# Patient Record
Sex: Female | Born: 1955 | Race: White | Hispanic: No | Marital: Married | Smoking: Never smoker
Health system: Southern US, Community
[De-identification: ages and names within clinical notes are randomized; demographics above are authoritative.]

## PROBLEM LIST (undated history)

## (undated) DIAGNOSIS — M25559 Pain in unspecified hip: Secondary | ICD-10-CM

## (undated) DIAGNOSIS — T7840XA Allergy, unspecified, initial encounter: Secondary | ICD-10-CM

## (undated) DIAGNOSIS — J45909 Unspecified asthma, uncomplicated: Secondary | ICD-10-CM

## (undated) DIAGNOSIS — M1990 Unspecified osteoarthritis, unspecified site: Secondary | ICD-10-CM

## (undated) DIAGNOSIS — R102 Pelvic and perineal pain: Secondary | ICD-10-CM

## (undated) DIAGNOSIS — G709 Myoneural disorder, unspecified: Secondary | ICD-10-CM

## (undated) DIAGNOSIS — I10 Essential (primary) hypertension: Secondary | ICD-10-CM

## (undated) DIAGNOSIS — F439 Reaction to severe stress, unspecified: Secondary | ICD-10-CM

## (undated) DIAGNOSIS — G43909 Migraine, unspecified, not intractable, without status migrainosus: Secondary | ICD-10-CM

## (undated) DIAGNOSIS — J9801 Acute bronchospasm: Secondary | ICD-10-CM

## (undated) DIAGNOSIS — K638219 Small intestinal bacterial overgrowth, unspecified: Secondary | ICD-10-CM

## (undated) DIAGNOSIS — K219 Gastro-esophageal reflux disease without esophagitis: Secondary | ICD-10-CM

## (undated) DIAGNOSIS — L270 Generalized skin eruption due to drugs and medicaments taken internally: Secondary | ICD-10-CM

## (undated) DIAGNOSIS — K449 Diaphragmatic hernia without obstruction or gangrene: Secondary | ICD-10-CM

## (undated) DIAGNOSIS — D414 Neoplasm of uncertain behavior of bladder: Secondary | ICD-10-CM

## (undated) DIAGNOSIS — G8929 Other chronic pain: Secondary | ICD-10-CM

## (undated) DIAGNOSIS — F419 Anxiety disorder, unspecified: Secondary | ICD-10-CM

## (undated) DIAGNOSIS — K6389 Other specified diseases of intestine: Secondary | ICD-10-CM

## (undated) DIAGNOSIS — T148XXA Other injury of unspecified body region, initial encounter: Secondary | ICD-10-CM

## (undated) DIAGNOSIS — Z77120 Contact with and (suspected) exposure to mold (toxic): Secondary | ICD-10-CM

## (undated) DIAGNOSIS — R51 Headache: Secondary | ICD-10-CM

## (undated) HISTORY — DX: Myoneural disorder, unspecified: G70.9

## (undated) HISTORY — DX: Contact with and (suspected) exposure to mold (toxic): Z77.120

## (undated) HISTORY — DX: Other injury of unspecified body region, initial encounter: T14.8XXA

## (undated) HISTORY — DX: Migraine, unspecified, not intractable, without status migrainosus: G43.909

## (undated) HISTORY — DX: Unspecified asthma, uncomplicated: J45.909

## (undated) HISTORY — DX: Other specified diseases of intestine: K63.89

## (undated) HISTORY — PX: OTHER SURGICAL HISTORY: SHX169

## (undated) HISTORY — DX: Reaction to severe stress, unspecified: F43.9

## (undated) HISTORY — DX: Gastro-esophageal reflux disease without esophagitis: K21.9

## (undated) HISTORY — DX: Essential (primary) hypertension: I10

## (undated) HISTORY — DX: Generalized skin eruption due to drugs and medicaments taken internally: L27.0

## (undated) HISTORY — PX: ABDOMINAL HYSTERECTOMY: SHX81

## (undated) HISTORY — DX: Unspecified osteoarthritis, unspecified site: M19.90

## (undated) HISTORY — DX: Anxiety disorder, unspecified: F41.9

## (undated) HISTORY — PX: WISDOM TOOTH EXTRACTION: SHX21

## (undated) HISTORY — DX: Headache: R51

## (undated) HISTORY — DX: Neoplasm of uncertain behavior of bladder: D41.4

## (undated) HISTORY — DX: Allergy, unspecified, initial encounter: T78.40XA

## (undated) HISTORY — DX: Pelvic and perineal pain: R10.2

## (undated) HISTORY — DX: Diaphragmatic hernia without obstruction or gangrene: K44.9

## (undated) HISTORY — DX: Pain in unspecified hip: M25.559

## (undated) HISTORY — DX: Other chronic pain: G89.29

## (undated) HISTORY — PX: COLONOSCOPY: SHX174

## (undated) HISTORY — DX: Small intestinal bacterial overgrowth, unspecified: K63.8219

## (undated) HISTORY — DX: Acute bronchospasm: J98.01

---

## 1985-05-12 HISTORY — PX: OTHER SURGICAL HISTORY: SHX169

## 1998-02-07 ENCOUNTER — Other Ambulatory Visit: Admission: RE | Admit: 1998-02-07 | Discharge: 1998-02-07 | Payer: Self-pay | Admitting: Obstetrics & Gynecology

## 1999-06-11 ENCOUNTER — Other Ambulatory Visit: Admission: RE | Admit: 1999-06-11 | Discharge: 1999-06-11 | Payer: Self-pay | Admitting: Obstetrics & Gynecology

## 1999-09-19 ENCOUNTER — Encounter: Payer: Self-pay | Admitting: Internal Medicine

## 1999-09-19 ENCOUNTER — Ambulatory Visit (HOSPITAL_COMMUNITY): Admission: RE | Admit: 1999-09-19 | Discharge: 1999-09-19 | Payer: Self-pay | Admitting: Internal Medicine

## 2001-04-28 ENCOUNTER — Other Ambulatory Visit: Admission: RE | Admit: 2001-04-28 | Discharge: 2001-04-28 | Payer: Self-pay | Admitting: Obstetrics & Gynecology

## 2004-03-26 ENCOUNTER — Other Ambulatory Visit: Admission: RE | Admit: 2004-03-26 | Discharge: 2004-03-26 | Payer: Self-pay | Admitting: Obstetrics & Gynecology

## 2004-05-20 ENCOUNTER — Ambulatory Visit: Payer: Self-pay | Admitting: Internal Medicine

## 2004-07-22 ENCOUNTER — Ambulatory Visit: Payer: Self-pay | Admitting: Family Medicine

## 2005-01-14 ENCOUNTER — Ambulatory Visit: Payer: Self-pay | Admitting: Internal Medicine

## 2005-04-30 ENCOUNTER — Ambulatory Visit: Payer: Self-pay | Admitting: Internal Medicine

## 2005-06-13 ENCOUNTER — Ambulatory Visit: Payer: Self-pay | Admitting: Internal Medicine

## 2005-06-16 ENCOUNTER — Encounter: Admission: RE | Admit: 2005-06-16 | Discharge: 2005-06-16 | Payer: Self-pay | Admitting: Internal Medicine

## 2005-06-18 ENCOUNTER — Ambulatory Visit: Payer: Self-pay | Admitting: Internal Medicine

## 2005-06-19 ENCOUNTER — Ambulatory Visit: Payer: Self-pay | Admitting: *Deleted

## 2005-07-04 ENCOUNTER — Ambulatory Visit: Payer: Self-pay | Admitting: Internal Medicine

## 2005-08-26 ENCOUNTER — Ambulatory Visit: Payer: Self-pay | Admitting: Internal Medicine

## 2005-09-02 ENCOUNTER — Ambulatory Visit: Payer: Self-pay | Admitting: Internal Medicine

## 2005-12-10 ENCOUNTER — Ambulatory Visit: Payer: Self-pay | Admitting: Internal Medicine

## 2006-01-13 ENCOUNTER — Ambulatory Visit: Payer: Self-pay | Admitting: Internal Medicine

## 2006-04-27 ENCOUNTER — Ambulatory Visit: Payer: Self-pay | Admitting: Internal Medicine

## 2006-06-15 ENCOUNTER — Inpatient Hospital Stay (HOSPITAL_COMMUNITY): Admission: RE | Admit: 2006-06-15 | Discharge: 2006-06-17 | Payer: Self-pay | Admitting: Obstetrics and Gynecology

## 2006-06-15 ENCOUNTER — Encounter (INDEPENDENT_AMBULATORY_CARE_PROVIDER_SITE_OTHER): Payer: Self-pay | Admitting: *Deleted

## 2006-09-03 ENCOUNTER — Encounter: Payer: Self-pay | Admitting: Internal Medicine

## 2006-09-21 ENCOUNTER — Ambulatory Visit: Payer: Self-pay | Admitting: Internal Medicine

## 2006-10-22 ENCOUNTER — Ambulatory Visit: Payer: Self-pay | Admitting: Internal Medicine

## 2006-11-25 ENCOUNTER — Ambulatory Visit: Payer: Self-pay | Admitting: Internal Medicine

## 2006-12-10 ENCOUNTER — Encounter: Payer: Self-pay | Admitting: *Deleted

## 2006-12-10 DIAGNOSIS — R1084 Generalized abdominal pain: Secondary | ICD-10-CM | POA: Insufficient documentation

## 2006-12-10 DIAGNOSIS — R109 Unspecified abdominal pain: Secondary | ICD-10-CM

## 2007-01-11 ENCOUNTER — Telehealth: Payer: Self-pay | Admitting: Internal Medicine

## 2007-01-13 DIAGNOSIS — R519 Headache, unspecified: Secondary | ICD-10-CM | POA: Insufficient documentation

## 2007-01-13 DIAGNOSIS — R51 Headache: Secondary | ICD-10-CM

## 2007-04-06 ENCOUNTER — Telehealth: Payer: Self-pay | Admitting: Internal Medicine

## 2007-04-26 ENCOUNTER — Ambulatory Visit: Payer: Self-pay | Admitting: Internal Medicine

## 2007-04-26 DIAGNOSIS — M76899 Other specified enthesopathies of unspecified lower limb, excluding foot: Secondary | ICD-10-CM | POA: Insufficient documentation

## 2007-04-26 DIAGNOSIS — N951 Menopausal and female climacteric states: Secondary | ICD-10-CM

## 2007-05-04 ENCOUNTER — Encounter: Payer: Self-pay | Admitting: Internal Medicine

## 2007-06-14 ENCOUNTER — Telehealth: Payer: Self-pay | Admitting: Internal Medicine

## 2007-06-29 ENCOUNTER — Ambulatory Visit: Payer: Self-pay | Admitting: Internal Medicine

## 2007-06-29 DIAGNOSIS — G4700 Insomnia, unspecified: Secondary | ICD-10-CM

## 2007-07-01 ENCOUNTER — Encounter: Payer: Self-pay | Admitting: Internal Medicine

## 2007-09-06 ENCOUNTER — Telehealth: Payer: Self-pay | Admitting: Internal Medicine

## 2007-09-10 ENCOUNTER — Ambulatory Visit: Payer: Self-pay | Admitting: Internal Medicine

## 2007-09-10 DIAGNOSIS — J309 Allergic rhinitis, unspecified: Secondary | ICD-10-CM | POA: Insufficient documentation

## 2007-09-10 DIAGNOSIS — N760 Acute vaginitis: Secondary | ICD-10-CM | POA: Insufficient documentation

## 2007-09-10 DIAGNOSIS — J019 Acute sinusitis, unspecified: Secondary | ICD-10-CM

## 2007-10-12 ENCOUNTER — Telehealth: Payer: Self-pay | Admitting: Internal Medicine

## 2007-11-11 ENCOUNTER — Ambulatory Visit: Payer: Self-pay | Admitting: Internal Medicine

## 2007-11-11 DIAGNOSIS — H00029 Hordeolum internum unspecified eye, unspecified eyelid: Secondary | ICD-10-CM | POA: Insufficient documentation

## 2007-12-10 ENCOUNTER — Telehealth: Payer: Self-pay | Admitting: Internal Medicine

## 2008-01-14 ENCOUNTER — Telehealth: Payer: Self-pay | Admitting: Internal Medicine

## 2008-03-08 ENCOUNTER — Telehealth: Payer: Self-pay | Admitting: Internal Medicine

## 2008-04-27 ENCOUNTER — Telehealth: Payer: Self-pay | Admitting: Internal Medicine

## 2008-05-16 ENCOUNTER — Ambulatory Visit: Payer: Self-pay | Admitting: Internal Medicine

## 2008-05-23 ENCOUNTER — Telehealth: Payer: Self-pay | Admitting: Internal Medicine

## 2008-06-13 ENCOUNTER — Ambulatory Visit: Payer: Self-pay | Admitting: Internal Medicine

## 2008-06-13 DIAGNOSIS — M79609 Pain in unspecified limb: Secondary | ICD-10-CM | POA: Insufficient documentation

## 2008-06-13 DIAGNOSIS — R21 Rash and other nonspecific skin eruption: Secondary | ICD-10-CM | POA: Insufficient documentation

## 2008-06-23 ENCOUNTER — Telehealth: Payer: Self-pay | Admitting: Internal Medicine

## 2008-06-26 ENCOUNTER — Telehealth: Payer: Self-pay | Admitting: Internal Medicine

## 2008-06-30 ENCOUNTER — Ambulatory Visit: Payer: Self-pay | Admitting: Internal Medicine

## 2008-07-10 ENCOUNTER — Telehealth: Payer: Self-pay | Admitting: Internal Medicine

## 2008-07-13 ENCOUNTER — Telehealth: Payer: Self-pay | Admitting: Internal Medicine

## 2008-07-19 ENCOUNTER — Encounter: Payer: Self-pay | Admitting: Internal Medicine

## 2008-08-28 ENCOUNTER — Ambulatory Visit: Payer: Self-pay | Admitting: Internal Medicine

## 2008-08-28 LAB — CONVERTED CEMR LAB
ALT: 13 units/L (ref 0–35)
AST: 17 units/L (ref 0–37)
Albumin: 3.6 g/dL (ref 3.5–5.2)
Alkaline Phosphatase: 39 units/L (ref 39–117)
BUN: 12 mg/dL (ref 6–23)
Basophils Absolute: 0 10*3/uL (ref 0.0–0.1)
Basophils Relative: 0.3 % (ref 0.0–3.0)
Bilirubin Urine: NEGATIVE
Bilirubin, Direct: 0.1 mg/dL (ref 0.0–0.3)
CO2: 28 meq/L (ref 19–32)
Calcium: 8.6 mg/dL (ref 8.4–10.5)
Chloride: 112 meq/L (ref 96–112)
Cholesterol: 136 mg/dL (ref 0–200)
Creatinine, Ser: 0.7 mg/dL (ref 0.4–1.2)
Eosinophils Absolute: 0.1 10*3/uL (ref 0.0–0.7)
Eosinophils Relative: 3.1 % (ref 0.0–5.0)
GFR calc non Af Amer: 93.58 mL/min (ref >60)
Glucose, Bld: 92 mg/dL (ref 70–99)
Glucose, Urine, Semiquant: NEGATIVE
HCT: 36.4 % (ref 36.0–46.0)
HDL: 44.7 mg/dL (ref >39.00)
Hemoglobin: 12.7 g/dL (ref 12.0–15.0)
Ketones, urine, test strip: NEGATIVE
LDL Cholesterol: 85 mg/dL (ref 0–99)
Lymphocytes Relative: 34.7 % (ref 12.0–46.0)
Lymphs Abs: 1.4 10*3/uL (ref 0.7–4.0)
MCHC: 35 g/dL (ref 30.0–36.0)
MCV: 88.1 fL (ref 78.0–100.0)
Monocytes Absolute: 0.3 10*3/uL (ref 0.1–1.0)
Monocytes Relative: 6.8 % (ref 3.0–12.0)
Neutro Abs: 2.1 10*3/uL (ref 1.4–7.7)
Neutrophils Relative %: 55.1 % (ref 43.0–77.0)
Nitrite: NEGATIVE
Platelets: 195 10*3/uL (ref 150.0–400.0)
Potassium: 3.9 meq/L (ref 3.5–5.1)
RBC: 4.13 M/uL (ref 3.87–5.11)
RDW: 12.5 % (ref 11.5–14.6)
Sodium: 144 meq/L (ref 135–145)
Specific Gravity, Urine: >=1.03
TSH: 1.85 microintl units/mL (ref 0.35–5.50)
Total Bilirubin: 0.6 mg/dL (ref 0.3–1.2)
Total CHOL/HDL Ratio: 3
Total Protein: 5.9 g/dL — ABNORMAL LOW (ref 6.0–8.3)
Triglycerides: 31 mg/dL (ref 0.0–149.0)
Urobilinogen, UA: 0.2
VLDL: 6.2 mg/dL (ref 0.0–40.0)
WBC Urine, dipstick: NEGATIVE
WBC: 3.9 10*3/uL — ABNORMAL LOW (ref 4.5–10.5)
pH: 5.5

## 2008-09-19 ENCOUNTER — Ambulatory Visit: Payer: Self-pay | Admitting: Internal Medicine

## 2008-10-03 ENCOUNTER — Telehealth: Payer: Self-pay | Admitting: *Deleted

## 2008-10-11 ENCOUNTER — Encounter: Payer: Self-pay | Admitting: Internal Medicine

## 2008-10-30 ENCOUNTER — Telehealth: Payer: Self-pay | Admitting: Internal Medicine

## 2008-12-25 ENCOUNTER — Telehealth: Payer: Self-pay | Admitting: *Deleted

## 2008-12-25 ENCOUNTER — Ambulatory Visit: Payer: Self-pay | Admitting: Family Medicine

## 2009-02-06 ENCOUNTER — Telehealth: Payer: Self-pay | Admitting: *Deleted

## 2009-03-05 ENCOUNTER — Telehealth: Payer: Self-pay | Admitting: *Deleted

## 2009-03-12 ENCOUNTER — Ambulatory Visit: Payer: Self-pay | Admitting: Internal Medicine

## 2009-03-12 DIAGNOSIS — J029 Acute pharyngitis, unspecified: Secondary | ICD-10-CM

## 2009-03-20 ENCOUNTER — Ambulatory Visit: Payer: Self-pay | Admitting: Internal Medicine

## 2009-03-26 ENCOUNTER — Telehealth: Payer: Self-pay | Admitting: *Deleted

## 2009-03-29 ENCOUNTER — Ambulatory Visit: Payer: Self-pay | Admitting: Internal Medicine

## 2009-03-29 DIAGNOSIS — K5289 Other specified noninfective gastroenteritis and colitis: Secondary | ICD-10-CM | POA: Insufficient documentation

## 2009-03-29 DIAGNOSIS — G43909 Migraine, unspecified, not intractable, without status migrainosus: Secondary | ICD-10-CM

## 2009-03-29 HISTORY — DX: Migraine, unspecified, not intractable, without status migrainosus: G43.909

## 2009-07-23 ENCOUNTER — Telehealth: Payer: Self-pay | Admitting: *Deleted

## 2009-08-30 ENCOUNTER — Ambulatory Visit: Payer: Self-pay | Admitting: Internal Medicine

## 2009-08-30 LAB — CONVERTED CEMR LAB
ALT: 13 units/L (ref 0–35)
AST: 18 units/L (ref 0–37)
Albumin: 4.1 g/dL (ref 3.5–5.2)
Alkaline Phosphatase: 33 units/L — ABNORMAL LOW (ref 39–117)
BUN: 22 mg/dL (ref 6–23)
Basophils Absolute: 0 10*3/uL (ref 0.0–0.1)
Basophils Relative: 0.4 % (ref 0.0–3.0)
Bilirubin Urine: NEGATIVE
Bilirubin, Direct: 0.1 mg/dL (ref 0.0–0.3)
Blood in Urine, dipstick: NEGATIVE
CO2: 27 meq/L (ref 19–32)
Calcium: 9 mg/dL (ref 8.4–10.5)
Chloride: 107 meq/L (ref 96–112)
Cholesterol: 137 mg/dL (ref 0–200)
Creatinine, Ser: 0.7 mg/dL (ref 0.4–1.2)
Eosinophils Absolute: 0.1 10*3/uL (ref 0.0–0.7)
Eosinophils Relative: 2.5 % (ref 0.0–5.0)
GFR calc non Af Amer: 93.21 mL/min (ref >60)
Glucose, Bld: 77 mg/dL (ref 70–99)
Glucose, Urine, Semiquant: NEGATIVE
HCT: 36.9 % (ref 36.0–46.0)
HDL: 49.6 mg/dL (ref >39.00)
Hemoglobin: 12.7 g/dL (ref 12.0–15.0)
Ketones, urine, test strip: NEGATIVE
LDL Cholesterol: 81 mg/dL (ref 0–99)
Lymphocytes Relative: 36.5 % (ref 12.0–46.0)
Lymphs Abs: 1.5 10*3/uL (ref 0.7–4.0)
MCHC: 34.4 g/dL (ref 30.0–36.0)
MCV: 87.5 fL (ref 78.0–100.0)
Monocytes Absolute: 0.2 10*3/uL (ref 0.1–1.0)
Monocytes Relative: 6.1 % (ref 3.0–12.0)
Neutro Abs: 2.2 10*3/uL (ref 1.4–7.7)
Neutrophils Relative %: 54.5 % (ref 43.0–77.0)
Nitrite: NEGATIVE
Platelets: 211 10*3/uL (ref 150.0–400.0)
Potassium: 4.2 meq/L (ref 3.5–5.1)
Protein, U semiquant: NEGATIVE
RBC: 4.21 M/uL (ref 3.87–5.11)
RDW: 13.1 % (ref 11.5–14.6)
Sodium: 139 meq/L (ref 135–145)
Specific Gravity, Urine: 1.015
TSH: 1.55 microintl units/mL (ref 0.35–5.50)
Total Bilirubin: 0.7 mg/dL (ref 0.3–1.2)
Total CHOL/HDL Ratio: 3
Total Protein: 6.6 g/dL (ref 6.0–8.3)
Triglycerides: 33 mg/dL (ref 0.0–149.0)
Urobilinogen, UA: 0.2
VLDL: 6.6 mg/dL (ref 0.0–40.0)
WBC Urine, dipstick: NEGATIVE
WBC: 4.1 10*3/uL — ABNORMAL LOW (ref 4.5–10.5)
pH: 6

## 2009-09-04 ENCOUNTER — Ambulatory Visit: Payer: Self-pay | Admitting: Internal Medicine

## 2009-10-05 ENCOUNTER — Telehealth: Payer: Self-pay | Admitting: Internal Medicine

## 2009-10-10 ENCOUNTER — Telehealth: Payer: Self-pay | Admitting: *Deleted

## 2009-10-29 ENCOUNTER — Telehealth: Payer: Self-pay | Admitting: *Deleted

## 2009-12-07 ENCOUNTER — Telehealth: Payer: Self-pay | Admitting: *Deleted

## 2010-02-20 ENCOUNTER — Ambulatory Visit: Payer: Self-pay | Admitting: Internal Medicine

## 2010-02-20 LAB — CONVERTED CEMR LAB: Rapid Strep: NEGATIVE

## 2010-02-22 ENCOUNTER — Telehealth: Payer: Self-pay | Admitting: Family Medicine

## 2010-02-25 ENCOUNTER — Telehealth: Payer: Self-pay | Admitting: Internal Medicine

## 2010-04-08 ENCOUNTER — Telehealth: Payer: Self-pay | Admitting: *Deleted

## 2010-06-11 NOTE — Progress Notes (Signed)
Summary: Pt said Cephlaxin in making her worse. Inf has spread.  Phone Note Call from Patient Call back at Home Phone [phone]   Caller: Patient Reason for Call: Acute Illness Summary of Call: Pt called and said that Cephlaxin 500mg  two times a day for throat inf, is making pt worse. Inf is now in pts lungs, and head. Pt is running a fever and is having chills. Pls call asap.  Initial call taken by: Lucy Antigua,  February 22, 2010 9:02 AM  Follow-up for Phone Call        Spoke with pt and she states that she is worse at night and rattling in her chest. Pt states that it's in her sinus and teeth. She is wanting to know if she needs to change antibiotics or not.  Follow-up by: Romualdo Bolk, CMA Duncan Dull),  February 22, 2010 2:11 PM  Additional Follow-up for Phone Call Additional follow up Details #1::        Per Dr. Fabian Sharp- it sounds viral. If she really alot worse can [facility]. 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 States she has already been her o nce this week and doesnt feel like it is viral.  Additional Follow-up for Phone Call Additional follow up Details #3:: Details for Additional Follow-up Action Taken: stop Keflex and start on Levaquin 500 mg once daily . Call in 10 days  Additional Follow-up by: Stephen A Fry MD,  February 22, 2010 3:52 PM  New/Updated Medications: LEVAQUIN 500 MG TABS (LEVOFLOXACIN) 1 once daily Prescriptions: LEVAQUIN 500 MG TABS (LEVOFLOXACIN) 1 once daily  #10 x 10   Entered by:   Bonnye M Williams, LPN   Authorized by:   Stephen A Fry MD   Signed by:   Regina G Hudy, RN on 02/22/2010   Method used:   Electronically to        [id number] (retail)       [address]       [place], [place]  [address]       Ph: [phone]       Fax: [phone]   RxID:   [id number]

## 2010-06-11 NOTE — Progress Notes (Signed)
Summary: refill on ambien  Phone Note From Pharmacy   Caller: [facility] [address] [phone]* Reason for Call: Needs renewal Details for Reason: ambien 10mg  Summary of Call: last filled on 02/19/10 #15 Initial call taken by: Romualdo Bolk, CMA (AAMA),  April 08, 2010 10:55 AM  Follow-up for Phone Call        refill x 2  Follow-up by: Madelin Headings MD,  April 08, 2010 12:48 PM  Additional Follow-up for Phone Call Additional follow up Details #1::        Rx faxed to pharmacy Additional Follow-up by: Romualdo Bolk, CMA Duncan Dull),  April 08, 2010 1:02 PM    Prescriptions: AMBIEN 10 MG  TABS (ZOLPIDEM TARTRATE) 1 by mouth at bedtime Brand medically necessary #15 x 1   Entered by:   Romualdo Bolk, CMA (AAMA)   Authorized by:   Madelin Headings MD   Signed by:   Romualdo Bolk, CMA (AAMA) on 04/08/2010   Method used:   Handwritten   RxID:   [id number]

## 2010-06-11 NOTE — Assessment & Plan Note (Signed)
Summary: follow up on labs/ssc   Vital Signs:  Patient profile:   55 year old female Menstrual status:  hysterectomy Height:      64 inches Weight:      140 pounds BMI:     24.12 Pulse rate:   60 / minute BP sitting:   142 / 72  (right arm) Cuff size:   regular  Vitals Entered By: Romualdo Bolk, CMA (AAMA) (September 04, 2009 11:27 AM)  CC: Follow-up visit on labs- Pt is also having vaginal itching and white discharge. Pt has used both fluzonazole and monistate. Pt is here for the toenail fungus. Pt would like a rx for lamsil. Pt has been on anibiotics 30 days or more. Pt is unsure if she has a discharge due to using monistat.   History of Present Illness: Karen Spears comes because of problems  with  vaninal signs  and se of meds?    She has had  a root canal and has been on antibiotic for this .  clindamycin       21 days then  doxycycline .  Vaginits is problematic and flaring up her pelvic nerve signs. some otc meds cause stinging   and mre irritation. duflucan had worked in the past but no recently.  .Diflucan cuased  a blister on face  and tried it twice and happened again.   No diarrhea noted Has had   nail changes thicening  that in the past got better with a coures of lamisil ? new rx.    Preventive Screening-Counseling & Management  Alcohol-Tobacco     Alcohol drinks/day: 0     Smoking Status: never     Passive Smoke Exposure: no  Caffeine-Diet-Exercise     Caffeine use/day: 2     Does Patient Exercise: no  Current Medications (verified): 1)  Lidoderm 5 %  Ptch (Lidocaine) .... Appky 1 Patch To Skin Daily 2)  Flexeril 10 Mg  Tabs (Cyclobenzaprine Hcl) .Marland Kitchen.. 1 By Mouth Once Daily As Needed 3)  Maxalt 10 Mg Tabs (Rizatriptan Benzoate) .... Take One Tablet As Need By Mouth As Needed For Headache. May Repeat in 2 Hours. 4)  Lidocaine 5 %  Oint (Lidocaine) .... Use As Directed 5)  Ambien 10 Mg  Tabs (Zolpidem Tartrate) .Marland Kitchen.. 1 By Mouth At Bedtime 6)  Ibuprofen 800  Mg  Tabs (Ibuprofen) .Marland Kitchen.. 1 By Mouth Two Times A Day 7)  Vicodin 5-500 Mg  Tabs (Hydrocodone-Acetaminophen) .Marland Kitchen.. 1 By Mouth Q 6 Hours As Needed Pain 8)  Vivelle-Dot 0.1 Mg/24hr  Pttw (Estradiol) .... Apply 2 X Per Week 9)  Promethazine Hcl 25 Mg Supp (Promethazine Hcl) .... 1/2 To 1 By Mouth Q 4-6 Hours As Needed Migraine Nausea and Vomiting 10)  Ketoprofen 50 Mg Caps (Ketoprofen) .Marland Kitchen.. 1 By Mouth Three Times A Day As Needed Headache 11)  Ondansetron Hcl 8 Mg Tabs (Ondansetron Hcl) .Marland Kitchen.. 1 By Mouth Q  4-6 Hours As Needed For Nausea  or As Directed  Max 4 Per 24 Hours  Allergies: 1)  ! Pcn 2)  ! Allegra (Fexofenadine Hcl) 3)  ! Zithromax 4)  ! Fluconazole (Fluconazole) 5)  Cefdinir (Cefdinir)  Past History:  Past medical, surgical, family and social histories (including risk factors) reviewed, and no changes noted (except as noted below).  Past Medical History: Reviewed history from 06/30/2008 and no changes required. 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 Hysterectomy 2/08 Vocal Cord Surgery 1987    Past History:  Care Management: Dermatology: Dorcas Mcmurray  Family History: Reviewed history from 09/19/2008 and no changes required. non contributory   CAD,CVAs  HAs   Social History: Reviewed history from 09/19/2008 and no changes required. Occupation: [employer]  Married Never Smoked Alcohol use-no Drug use-no  Regular exercise-no  some  has grandchildren    Review of Systems  The patient denies anorexia, fever, weight loss, weight gain, vision loss, prolonged cough, abdominal pain, melena, hematochezia, severe indigestion/heartburn, hematuria, unusual weight change, abnormal bleeding, enlarged lymph nodes, and angioedema.    Physical Exam  General:   Well-developed,well-nourished,in no acute distress; alert,appropriate and cooperative throughout examination Head:  normocephalic and atraumatic.   Abdomen:  Bowel sounds positive,abdomen soft and non-tender without masses, organomegaly or  noted. Genitalia:  slightr erythema  cream white in vault  small fissue post fourchette.   no lesions  Pulses:  pulses intact without delay  nl cap refill Extremities:  no clubbing cyanosis or edema  Neurologic:  alert oriented  sick  verbalgait normal.   Skin:  turgor normal, color normal, no ecchymoses, and no petechiae.   naild with some thickening distally   but toe nails ok  Cervical Nodes:  No lymphadenopathy noted Psych:  Oriented X3, good eye contact, and not depressed appearing.  midly uncomfortable and tired appearing   Impression & Recommendations:  Problem # 1:  VAGINITIS (ICD-616.10)  prob partly rx yeats and has sens to meds     poss with terazole also but worth trying    Her updated medication list for this problem includes:    Terazol 3 80 Mg Supp (Terconazole) ..... Use  q hs x 3  intravaginally  Orders: Prescription Created Electronically [phone])  Problem # 2:  ? of ADVERSE REACTION TO MEDICATION (ICD-995.29) Unusual hx   had urning looking blister on nose and face and neck.   Like a burn.   will avoid diflucan for now  Problem # 3:  ? of DERMATOPHYTOSIS OF NAIL (ICD-110.1) not an impressive exam but   will wait on this problem   Discussed risk benefit  of oral meds discussed esp with above hx .  The following medications were removed from the medication list:    Fluconazole 150 Mg Tabs (Fluconazole) .Marland Kitchen... 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   Complete Medication List: 1)  Lidoderm 5 % Ptch (Lidocaine) .... Appky 1 patch to skin daily 2)  Flexeril 10 Mg Tabs  (Cyclobenzaprine hcl) .Marland Kitchen.. 1 by mouth once daily as needed 3)  Maxalt 10 Mg Tabs (Rizatriptan benzoate) .... Take one tablet as need by mouth as needed for headache. may repeat in 2 hours. 4)  Lidocaine 5 % Oint (Lidocaine) .... Use as directed 5)  Ambien 10 Mg Tabs (Zolpidem tartrate) .Marland Kitchen.. 1 by mouth at bedtime 6)  Ibuprofen 800 Mg Tabs (Ibuprofen) .Marland Kitchen.. 1 by mouth two times a day 7)  Vicodin 5-500 Mg Tabs (Hydrocodone-acetaminophen) .Marland Kitchen.. 1 by mouth q 6 hours as needed pain 8)  Vivelle-dot 0.1 Mg/24hr Pttw (Estradiol) .... Apply 2 x per week 9)  Promethazine Hcl 25 Mg Supp (Promethazine  hcl) .... 1/2 to 1 by mouth q 4-6 hours as needed migraine nausea and vomiting 10)  Ketoprofen 50 Mg Caps (Ketoprofen) .Marland Kitchen.. 1 by mouth three times a day as needed headache 11)  Ondansetron Hcl 8 Mg Tabs (Ondansetron hcl) .Marland Kitchen.. 1 by mouth q  4-6 hours as needed for nausea  or as directed  max 4 per 24 hours 12)  Terazol 3 80 Mg Supp (Terconazole) .... Use  q hs x 3  intravaginally  Patient Instructions: 1)  try   the terazole  vaginal 3 day 2)  call if not helping or side effect.  And we will try other options.  3)  would wait on the lamisil till the tooth problem is stable to avoid poss of   side effects of meds or other.  4)  call when want to do the lamisil . 5)  Your labs are normal today.  Prescriptions: TERAZOL 3 80 MG SUPP (TERCONAZOLE) use  q hs x 3  intravaginally  #1 pack x 0   Entered and Authorized by:   Madelin Headings MD   Signed by:   Madelin Headings MD on 09/04/2009   Method used:   Electronically to        [facility] (retail)       [address]       [place], [place]  [address]       Ph: [phone] or [phone]       Fax: [phone]   RxID:   [id number]

## 2010-06-11 NOTE — Progress Notes (Signed)
Summary: refill on ambien  Phone Note From Pharmacy   Caller: [facility] [address] [phone]* Reason for Call: Needs renewal Details for Reason: ambien 10mg  Summary of Call: last 06/12/09 #15 Initial call taken by: Romualdo Bolk, CMA (AAMA),  July 23, 2009 11:45 AM  Follow-up for Phone Call        ok to refill x 2  Follow-up by: Madelin Headings MD,  July 23, 2009 12:24 PM  Additional Follow-up for Phone Call Additional follow up Details #1::        Rx sent back via fax. Additional Follow-up by: Romualdo Bolk, CMA (AAMA),  July 23, 2009 3:04 PM    Prescriptions: AMBIEN 10 MG  TABS (ZOLPIDEM TARTRATE) 1 by mouth at bedtime Brand medically necessary #15 x 1   Entered by:   Romualdo Bolk, CMA (AAMA)   Authorized by:   Madelin Headings MD   Signed by:   Romualdo Bolk, CMA (AAMA) on 07/23/2009   Method used:   Historical   RxID:   [id number]

## 2010-06-11 NOTE — Progress Notes (Signed)
Summary: Call-A-Nurse Report    Call-A-Nurse Triage Call Report Triage Record Num: [id number] Operator: Dayton Martes Patient Name: Karen Spears Call Date & Time: 02/23/2010 11:11:31AM Patient Phone: [phone] PCP: Patient Gender: Female PCP Fax : Patient DOB: 07-07-1955 [facility]: [name] Reason for Call: Pt sts that she called the office yesterday (02/22/10) regarding a RX and "2 nurses called me back and told me that they had called in a RX for me and it is not there." Pt sts that the RX was an abx. Transferred to Lupita Leash in office who sts that she can help pt with this. Protocol(s) Used: Office Note Recommended Outcome per Protocol: Information Noted and Sent to Office Reason for Outcome: Caller information to office Care Advice:  ~ 02/23/2010 11:16:21AM Page 1 of 1 [id number]   Spoke with pt and she is going to get the Levaquin today.    Romualdo Bolk, CMA (AAMA)  February 25, 2010 11:21 AM

## 2010-06-11 NOTE — Progress Notes (Signed)
Summary: REFILL REQUEST (Ambien)  Phone Note Refill Request Message from:  Fax from Pharmacy on Oct 05, 2009 2:32 PM  Refills Requested: Medication #1:  AMBIEN 10 MG  TABS 1 by mouth at bedtime [BMN]   Notes: [facility] - [address] [place]....Marland KitchenMarland KitchenQty - 15 tabs.    Initial call taken by: Debbra Riding,  Oct 05, 2009 2:33 PM  Follow-up for Phone Call        ok to refill x 3  Follow-up by: Madelin Headings MD,  Oct 05, 2009 5:35 PM    Prescriptions: AMBIEN 10 MG  TABS (ZOLPIDEM TARTRATE) 1 by mouth at bedtime Brand medically necessary #15 x 3   Entered by:   Lynann Beaver CMA   Authorized by:   Madelin Headings MD   Signed by:   Lynann Beaver CMA on 10/05/2009   Method used:   Telephoned to ...       [facility] (retail)       [address]       [place], [place]  [address]       Ph: [phone] or [phone]       Fax: [phone]   RxID:   [id number]

## 2010-06-11 NOTE — Letter (Signed)
Summary: [facility]   Imported By: Maryln Gottron 05/30/2009 10:04:06  _____________________________________________________________________  External Attachment:    Type:   Image     Comment:   External Document

## 2010-06-11 NOTE — Progress Notes (Signed)
Summary: refill on terazole  Phone Note Call from Patient   Caller: Patient Summary of Call: Pt needs a refill on terazol. Gweneth Dimitri.  Initial call taken by: Romualdo Bolk, CMA Duncan Dull),  December 07, 2009 9:39 AM  Follow-up for Phone Call        ok x 2  Follow-up by: Madelin Headings MD,  December 07, 2009 12:40 PM    Prescriptions: TERAZOL 3 80 MG SUPP (TERCONAZOLE) use  q hs x 3  intravaginally  #20 Gram x 1   Entered by:   Romualdo Bolk, CMA (AAMA)   Authorized by:   Madelin Headings MD   Signed by:   Romualdo Bolk, CMA (AAMA) on 12/07/2009   Method used:   Electronically to        [facility] (retail)       [address]       [place], [place]  [address]       Ph: [phone] or [phone]       Fax: [phone]   RxID:   [id number]

## 2010-06-11 NOTE — Assessment & Plan Note (Signed)
Summary: severe sore throat/earache/sinus/cjr   Vital Signs:  Patient profile:   55 year old female Menstrual status:  hysterectomy Weight:      149 pounds Temp:     98.1 degrees F oral Pulse rate:   72 / minute BP sitting:   130 / 80  (right arm) Cuff size:   regular  Vitals Entered By: Romualdo Bolk, CMA (AAMA) (February 20, 2010 12:16 PM)  Serial Vital Signs/Assessments:  Time      Position  BP       Pulse  Resp  Temp     By                     956/21                         Madelin Headings MD  Comments: right arm sitting By: Madelin Headings MD   CC: Sore throat, swollen glands and ears clogged. This started on 9/8. Chills and sweats at night.   History of Present Illness: Karen Spears comes in today  for acute visit for  above. very bad sore throat for a few days .  monor cough and congestion. noexposire except to 10 years ol a few days ago.  ? if BP is up.  no vomiting diarrhea or new rash. Recently had dental issues and to be put on medrol dose pack .  Has ? about  tramadol   unsure if had se on the past.  Preventive Screening-Counseling & Management  Alcohol-Tobacco     Alcohol drinks/day: 0     Smoking Status: never     Passive Smoke Exposure: no  Caffeine-Diet-Exercise     Caffeine use/day: 2     Does Patient Exercise: no  Current Medications (verified): 1)  Lidoderm 5 %  Ptch (Lidocaine) .... Appky 1 Patch To Skin Daily 2)  Flexeril 10 Mg  Tabs (Cyclobenzaprine Hcl) .Marland Kitchen.. 1 By Mouth Once Daily As Needed 3)  Maxalt 10 Mg Tabs (Rizatriptan Benzoate) .... Take One Tablet As Need By Mouth As Needed For Headache. May Repeat in 2 Hours. 4)  Lidocaine 5 %  Oint (Lidocaine) .... Use As Directed 5)  Ambien 10 Mg  Tabs (Zolpidem Tartrate) .Marland Kitchen.. 1 By Mouth At Bedtime 6)  Ibuprofen 800 Mg  Tabs (Ibuprofen) .Marland Kitchen.. 1 By Mouth Two Times A Day 7)  Vicodin 5-500 Mg  Tabs (Hydrocodone-Acetaminophen) .Marland Kitchen.. 1 By Mouth Q 6 Hours As Needed Pain 8)  Vivelle-Dot 0.1 Mg/24hr   Pttw (Estradiol) .... Apply 2 X Per Week 9)  Promethazine Hcl 25 Mg Supp (Promethazine Hcl) .... 1/2 To 1 By Mouth Q 4-6 Hours As Needed Migraine Nausea and Vomiting 10)  Ketoprofen 50 Mg Caps (Ketoprofen) .Marland Kitchen.. 1 By Mouth Three Times A Day As Needed Headache 11)  Ondansetron Hcl 8 Mg Tabs (Ondansetron Hcl) .Marland Kitchen.. 1 By Mouth Q  4-6 Hours As Needed For Nausea  or As Directed  Max 4 Per 24 Hours 12)  Terazol 3 80 Mg Supp (Terconazole) .... Use  Q Hs X 3  Intravaginally  Allergies (verified): 1)  ! Pcn 2)  ! Allegra 3)  ! Zithromax 4)  ! Fluconazole (Fluconazole) 5)  Cefdinir (Cefdinir)  Past History:  Past medical, surgical, family and social histories (including risk factors) reviewed, and no changes noted (except as noted below).  Past Medical History: Reviewed history from 06/30/2008 and no changes required. 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 Hysterectomy 2/08 Vocal Cord Surgery 1987    Past History:  Care Management: Dermatology: Dorcas Mcmurray  Family History: Reviewed history from 09/19/2008 and no changes required. non contributory   CAD,CVAs  HAs   Social History: Reviewed history from 09/19/2008 and no changes required. Occupation: [employer]  Married Never Smoked Alcohol use-no Drug use-no  Regular exercise-no  some  has grandchildren    Review of Systems  The patient denies anorexia, fever, syncope, dyspnea on exertion, prolonged cough, hematuria, muscle weakness, abnormal bleeding, and angioedema.    Physical Exam  General:  alert, well-developed, well-nourished, and well-hydrated.  mildly i;; non toxic  Head:  normocephalic and atraumatic.   Eyes:  vision grossly intact, pupils equal, and pupils round.   Ears:  R ear normal, L ear normal, and  no external deformities.   Nose:  no external deformity, no external erythema, and no nasal discharge.   Mouth:  op 3+ fiery red  slight edema and  poss blebs but no ulcers and no exudates  Neck:  No deformities, masses, o noted. tender ac area Lungs:  normal respiratory effort, no intercostal retractions, normal breath sounds, no dullness, and no fremitus.   Heart:  normal rate and regular rhythm.   Skin:  turgor normal, color normal, no ecchymoses, and no petechiae.   Cervical Nodes:  tender ac  shoddy  Psych:  Oriented X3, normally interactive, good eye contact, not anxious appearing, not depressed appearing, not agitated, and not suicidal.     Impression & Recommendations:  Problem # 1:  PHARYNGITIS-ACUTE (ICD-462) appears severe   but nl airway   r/o strep as exposed to children possible that this could be early viral ulcerative  by severity . is all to pcn  can take keflex.   Her updated medication list for this problem includes:    Ibuprofen 800 Mg Tabs (Ibuprofen) .Marland Kitchen... 1 by mouth two times a day    Ketoprofen 50 Mg Caps (Ketoprofen) .Marland Kitchen... 1 by mouth three times a day as needed headache    Cephalexin 500 Mg Caps (Cephalexin) .Marland Kitchen... 1 by mouth two times a day  for throat infectioin  Orders: Rapid Strep (81191) T-Culture, Throat ([address]-56[address])  Problem # 2:  dental issues  not seemingly related  disc  about  pain meds tramadol        ...  unsures if she has taken this before and had a se,  can monitor bp readings call if elevated   Complete Medication List: 1)  Lidoderm 5 % Ptch (Lidocaine) .... Appky 1 patch to skin daily 2)  Flexeril 10 Mg Tabs (Cyclobenzaprine hcl) .Marland Kitchen.. 1 by mouth once daily as needed 3)  Maxalt 10 Mg Tabs (Rizatriptan benzoate) .... Take one tablet as need by mouth as needed for headache. may repeat in 2 hours. 4)  Lidocaine 5 % Oint (Lidocaine) .... Use as directed 5)  Ambien 10 Mg Tabs (Zolpidem tartrate) .Marland Kitchen.. 1 by mouth at bedtime 6)  Ibuprofen 800 Mg  Tabs (Ibuprofen) .Marland Kitchen.. 1 by mouth two times a day 7)  Vicodin 5-500 Mg Tabs (Hydrocodone-acetaminophen) .Marland Kitchen.. 1 by mouth q 6 hours as needed pain 8)  Vivelle-dot 0.1 Mg/24hr Pttw (Estradiol) .... Apply 2 x per week 9)  Promethazine Hcl 25 Mg Supp (  Promethazine hcl) .... 1/2 to 1 by mouth q 4-6 hours as needed migraine nausea and vomiting 10)  Ketoprofen 50 Mg Caps (Ketoprofen) .Marland Kitchen.. 1 by mouth three times a day as needed headache 11)  Ondansetron Hcl 8 Mg Tabs (Ondansetron hcl) .Marland Kitchen.. 1 by mouth q  4-6 hours as needed for nausea  or as directed  max 4 per 24 hours 12)  Terazol 3 80 Mg Supp (Terconazole) .... Use  q hs x 3  intravaginally 13)  Cephalexin 500 Mg Caps (Cephalexin) .Marland Kitchen.. 1 by mouth two times a day  for throat infectioin  Patient Instructions: 1)  will call about culture results  2)  antibioitc in case this is strep or bacterial infection 3)  You can add the cortisone  in a day after begining the antibiotic .   Prescriptions: CEPHALEXIN 500 MG CAPS (CEPHALEXIN) 1 by mouth two times a day  for throat infectioin  #20 x 0   Entered and Authorized by:   Madelin Headings MD   Signed by:   Madelin Headings MD on 02/20/2010   Method used:   Electronically to        [id number] (retail)       [address]       [place], [place]  [address]       Ph: [phone]       Fax: [phone]   RxID:   [id number]   Laboratory Results  Date/Time Received: February 20, 2010 12:45 PM  Date/Time Reported: February 20, 2010 12:44 PM   Other Tests  Rapid Strep: negative Comments Wynona Canes, CMA  February 20, 2010 12:45 PM

## 2010-06-11 NOTE — Letter (Signed)
Summary: [facility]  [facility]   Imported By: Maryln Gottron 05/30/2009 11:24:41  _____________________________________________________________________  External Attachment:    Type:   Image     Comment:   External Document

## 2010-06-11 NOTE — Progress Notes (Signed)
Summary: MED REFILL  Phone Note Call from Patient Call back at Home Phone [phone]   Caller: Patient Call For: Madelin Headings MD Summary of Call: PT NEEDS REFILL ON [facility] 10MG  NO GENERIC CALL INTO [place] [phone] Initial call taken by: Heron Sabins,  October 10, 2009 2:29 PM  Follow-up for Phone Call        Faxed back to pharmacy as DAW. Follow-up by: Romualdo Bolk, CMA (AAMA),  October 10, 2009 3:48 PM

## 2010-06-11 NOTE — Progress Notes (Signed)
Summary: refills on ketoprofen  Phone Note From Pharmacy   Caller: [facility] [address] [phone]* Reason for Call: Needs renewal Details for Reason: Ketoprofen Initial call taken by: Romualdo Bolk, CMA (AAMA),  October 29, 2009 11:50 AM  Follow-up for Phone Call        ok x 3  Follow-up by: Madelin Headings MD,  October 29, 2009 12:58 PM  Additional Follow-up for Phone Call Additional follow up Details #1::        Rx sent to pharmacy. Additional Follow-up by: Romualdo Bolk, CMA Duncan Dull),  October 29, 2009 2:44 PM    Prescriptions: KETOPROFEN 50 MG CAPS (KETOPROFEN) 1 by mouth three times a day as needed headache  #30 x 2   Entered by:   Romualdo Bolk, CMA (AAMA)   Authorized by:   Madelin Headings MD   Signed by:   Romualdo Bolk, CMA (AAMA) on 10/29/2009   Method used:   Electronically to        [facility] (retail)       [address]       [place], [place]  [address]       Ph: [phone] or [phone]       Fax: [phone]   RxID:   [id number]

## 2010-07-12 ENCOUNTER — Other Ambulatory Visit: Payer: Self-pay | Admitting: Internal Medicine

## 2010-07-12 ENCOUNTER — Other Ambulatory Visit: Payer: Self-pay | Admitting: *Deleted

## 2010-07-12 NOTE — Telephone Encounter (Signed)
Last filled on 05/27/10 #15

## 2010-07-12 NOTE — Telephone Encounter (Signed)
Ok to refill x 2  Last cpx labs were April . Schedule cpx and labs for end April or May. 2012

## 2010-07-15 MED ORDER — AMBIEN 10 MG PO TABS: 10.0000 mg | ORAL_TABLET | Freq: Every evening | ORAL | Status: DC | PRN

## 2010-07-15 NOTE — Telephone Encounter (Signed)
Ok to refill x 3 

## 2010-07-15 NOTE — Telephone Encounter (Signed)
rx faxed to pharmacy

## 2010-08-13 ENCOUNTER — Telehealth: Payer: Self-pay | Admitting: *Deleted

## 2010-08-13 NOTE — Telephone Encounter (Signed)
?   Leda Quail and Aram Beecham Romine   Consider rx with flagyl  Oral  500 bid for 7 days  If ongoing int he meantime.

## 2010-08-13 NOTE — Telephone Encounter (Signed)
Pt called saying that she having bacterial vaginal infection. She is wanting to see a gyn for this but her last one she doesn't want to see. She saw them 4 years ago. She is having a thick lime green discharge. Pt is going to try OTC medication first. I did offer her an appt tomorrow with Dr. Fabian Sharp. But she wants to try this first then she will call us if she is not getting any better.

## 2010-08-14 MED ORDER — METRONIDAZOLE 500 MG PO TABS: 500.0000 mg | ORAL_TABLET | Freq: Two times a day (BID) | ORAL | Status: AC

## 2010-08-14 NOTE — Telephone Encounter (Signed)
Left message for pt to call back to let us know if she would like the rx called in.

## 2010-08-14 NOTE — Telephone Encounter (Signed)
Pt aware and wants rx called in. Rx sent to pharmacy

## 2010-08-19 ENCOUNTER — Telehealth: Payer: Self-pay

## 2010-08-19 MED ORDER — HYDROCODONE-ACETAMINOPHEN 5-500 MG PO TABS: 1.0000 | ORAL_TABLET | ORAL | Status: DC | PRN

## 2010-08-19 NOTE — Telephone Encounter (Signed)
Refill hydrocodone  5-500

## 2010-08-22 ENCOUNTER — Other Ambulatory Visit: Payer: Self-pay | Admitting: Internal Medicine

## 2010-09-27 NOTE — Op Note (Signed)
Karen Spears, Karen Spears             ACCOUNT NO.:  [id number]   MEDICAL RECORD NO.:  [id number]          PATIENT TYPE:  AMB   LOCATION:  SDC                           FACILITY:  [facility]   PHYSICIAN:  Michelle L. Grewal, M.D.DATE OF BIRTH:  16-Mar-1956   DATE OF PROCEDURE:  06/15/2006  DATE OF DISCHARGE:                               OPERATIVE REPORT   PREOPERATIVE DIAGNOSES:  1. Pelvic pain.  2. Menorrhagia.   POSTOPERATIVE DIAGNOSES:  1. Pelvic pain.  2. Menorrhagia.  3. Fibroids.  4. Ovarian cyst.   PROCEDURE:  Total abdominal hysterectomy and bilateral salpingo-  oophorectomy.   SURGEON:  Michelle L. Vincente Poli, M.D.   ASSISTANT:  Zelphia Cairo, MD   SPECIMENS:  Uterus, cervix, tubes and ovaries.   ESTIMATED BLOOD LOSS:  150 mL.   COMPLICATIONS:  None.   PROCEDURE:  The patient was taken to the operating room.  She was  intubated without difficulty.  She was prepped and draped in the usual  sterile fashion.  A Foley catheter was inserted.  A low transverse  incision was made and carried down the fascia, the fascia scored in the  midline and extended laterally.  The rectus muscles were separated in  the midline.  The peritoneum was entered bluntly.  The peritoneal  incision was stretched.  The exploration of the abdomen noted that the  appendix was normal.  The uterus was enlarged and had multiple fibroids  within it.  The ovaries had right greater than left ovarian cysts.  No  endometriosis was seen.  We then placed a retractor inside the abdominal  cavity.  The triple pedicle was gradually grasped either side using  Kelly clamps and the round ligament was identified on the right side.  It was suture ligated.  The broad ligaments were incised on both sides  and a triple pedicle was created beneath the IP ligament on the right.  An avascular window was created beneath the IP ligament on the right and  a curved Heaney clamp was placed across the specimen.  It was cut,  suture ligated using an 0 Vicryl suture, and then ligated with a free  tie of 0 Vicryl suture.  This was done on the left side in a similar  fashion.  The bladder was dissected free of the cervix.  We then  skeletonized the uterine arteries easily and then placed curved Heaney  clamps just beside the cervical internal os.  Each pedicle was suture  ligated using an 0 Vicryl suture.  With careful attention that the  bladder was well below our clamps ,was then placed straight clamps just  beside the cervix.  Each pedicle was suture ligated using 0 Vicryl  suture.  We walked our way down the cervix and then placed curved Heaney  clamps beneath the cervical external os.  The specimen was removed using  Mayo scissors.  The angle stitches were placed using 0 Vicryl.  The  remainder of the cuff was closed using figure-of-eights using 0 Vicryl.  Irrigation was performed.  Hemostasis was excellent.  The peritoneum was  closed using 0 Vicryl.  This was done after all instruments and  laparotomy pads were removed from the abdominal cavity.  The peritoneum  was closed using 0 Vicryl.  The rectus muscles were reapproximated using  0 Vicryl.  The fascia was closed using 0 Vicryl starting at each corner  and meeting in the midline.  After irrigation of the subcutaneous layer,  the skin was closed staples.  All sponge, lap and instrument counts were  correct x2.  The patient went to the recovery room in stable condition.      Michelle L. Vincente Poli, M.D.  Electronically Signed     MLG/MEDQ  D:  06/15/2006  T:  06/15/2006  Job:  [id number]

## 2010-09-27 NOTE — Letter (Signed)
December 02, 2006    Saralyn Pilar, M.D.  [facility] [address] [place] [address]   RE:  VINAYA, SANCHO  MRN:  [id number]  /  DOB:  March 21, 1956   Dear Dr. Genevive Bi:   You have seen Ms. Karen Spears in the past for pelvic abdominal pain  status post hysterectomy surgery.  As you know, she has had some  significant chronic pain that has caused her great distress, that has  persisted after her surgery.  You did see her as a consultation from the  OB-GYN and recommended Lamictal which initially she declined to take.  However, she did come and see me and we tried starting it; however, she  was not able to take it more than 2 weeks because of what she called CNS  side effects that made her feel different in the head.  Since that  time she has undergone some vigorous physical therapy locally which  seems that it has helped some in that, although not relieving all of her  pain, it seems to have localized it a bit more.  However, she is still  concerned about the spasm and pain and we feel that we need more advice  in this area.  She also describes fullness at times with something  falling down in the vaginal area.   I would kindly request a second look at her situation and any advice on  helping her  predicament would be gladly accepted.  She is currently  taking a Lidoderm patch and some Lidoderm ointment locally.   Thank you very much.    Sincerely,      Neta Mends. Panosh, MD  Electronically Signed    WKP/MedQ  DD: 12/02/2006  DT: 12/03/2006  Job #: [id number]

## 2010-09-27 NOTE — Discharge Summary (Signed)
Karen Spears, Karen Spears             ACCOUNT NO.:  [id number]   MEDICAL RECORD NO.:  [id number]          PATIENT TYPE:  INP   LOCATION:  9302                          FACILITY:  [facility]   PHYSICIAN:  Michelle L. Grewal, M.D.DATE OF BIRTH:  1955/10/16   DATE OF ADMISSION:  06/15/2006  DATE OF DISCHARGE:  06/17/2006                               DISCHARGE SUMMARY   PREOPERATIVE DIAGNOSES:  1. Pelvic pain.  2. Menorrhagia.  3. Endometrial polyps.  4. Dysmenorrhea.  5. Fibroids.   DISCHARGE DIAGNOSES:  1. Pelvic pain.  2. Menorrhagia.  3. Endometrial polyps.  4. Dysmenorrhea.  5. Fibroids.   HOSPITAL COURSE:  The patient is a 55 year old G2, P2, with terrible  pelvic pain and heavy periods.  Her ultrasound is consistent with  endometrial polyps and questionable endometriosis.  The patient  underwent a TAH/BSO.  EBL was 150 mL.  She did very well postop.  On  postop day #1, hemoglobin was 10.2.  By postop day #2, she was in good  condition.  She was afebrile with stable vital signs.   CONDITION ON DISCHARGE:  She was discharged home in good condition.   DISCHARGE MEDICATIONS:  1. Ibuprofen 600 mg every 6 hours to take as needed for pain.  2. Vicodin one to two as needed for pain.  3. Phenergan to take as needed for nausea.   FOLLOWUP:  She will follow up next Tuesday to have her staples removed.   DISCHARGE INSTRUCTIONS:  She is advised no driving for 2 weeks, no  sexual activity for 6 weeks and to call if she has any temperature  greater than 100.5, nausea, vomiting or severe abdominal pain.      Michelle L. Vincente Poli, M.D.  Electronically Signed     MLG/MEDQ  D:  07/02/2006  T:  07/02/2006  Job:  [id number]

## 2010-10-09 ENCOUNTER — Other Ambulatory Visit (INDEPENDENT_AMBULATORY_CARE_PROVIDER_SITE_OTHER): Payer: PRIVATE HEALTH INSURANCE

## 2010-10-09 DIAGNOSIS — Z0000 Encounter for general adult medical examination without abnormal findings: Secondary | ICD-10-CM

## 2010-10-09 LAB — POCT URINALYSIS DIPSTICK
Leukocytes, UA: NEGATIVE
Protein, UA: NEGATIVE
Urobilinogen, UA: 0.2

## 2010-10-09 LAB — CBC WITH DIFFERENTIAL/PLATELET
Eosinophils Relative: 1.6 % (ref 0.0–5.0)
Lymphocytes Relative: 31.5 % (ref 12.0–46.0)
MCV: 88.7 fl (ref 78.0–100.0)
Monocytes Absolute: 0.3 10*3/uL (ref 0.1–1.0)
Neutrophils Relative %: 59.8 % (ref 43.0–77.0)
Platelets: 235 10*3/uL (ref 150.0–400.0)
WBC: 5 10*3/uL (ref 4.5–10.5)

## 2010-10-09 LAB — BASIC METABOLIC PANEL
Chloride: 106 mEq/L (ref 96–112)
GFR: 94.37 mL/min (ref >60.00)
Glucose, Bld: 90 mg/dL (ref 70–99)
Potassium: 5 mEq/L (ref 3.5–5.1)
Sodium: 138 mEq/L (ref 135–145)

## 2010-10-09 LAB — HEPATIC FUNCTION PANEL
AST: 17 U/L (ref 0–37)
Albumin: 4 g/dL (ref 3.5–5.2)
Alkaline Phosphatase: 46 U/L (ref 39–117)
Total Bilirubin: 0.5 mg/dL (ref 0.3–1.2)

## 2010-10-09 LAB — TSH: TSH: 1.83 u[IU]/mL (ref 0.35–5.50)

## 2010-10-09 LAB — LIPID PANEL
LDL Cholesterol: 84 mg/dL (ref 0–99)
VLDL: 7.2 mg/dL (ref 0.0–40.0)

## 2010-10-15 ENCOUNTER — Encounter: Payer: Self-pay | Admitting: Internal Medicine

## 2010-10-16 ENCOUNTER — Encounter: Payer: Self-pay | Admitting: Internal Medicine

## 2010-10-16 ENCOUNTER — Ambulatory Visit (INDEPENDENT_AMBULATORY_CARE_PROVIDER_SITE_OTHER): Payer: PRIVATE HEALTH INSURANCE | Admitting: Internal Medicine

## 2010-10-16 VITALS — BP 140/90 | HR 72 | Ht 64.0 in | Wt 146.0 lb

## 2010-10-16 DIAGNOSIS — N949 Unspecified condition associated with female genital organs and menstrual cycle: Secondary | ICD-10-CM

## 2010-10-16 DIAGNOSIS — G43909 Migraine, unspecified, not intractable, without status migrainosus: Secondary | ICD-10-CM

## 2010-10-16 DIAGNOSIS — R102 Pelvic and perineal pain unspecified side: Secondary | ICD-10-CM | POA: Insufficient documentation

## 2010-10-16 DIAGNOSIS — D485 Neoplasm of uncertain behavior of skin: Secondary | ICD-10-CM | POA: Insufficient documentation

## 2010-10-16 DIAGNOSIS — Z0000 Encounter for general adult medical examination without abnormal findings: Secondary | ICD-10-CM | POA: Insufficient documentation

## 2010-10-16 DIAGNOSIS — F40240 Claustrophobia: Secondary | ICD-10-CM

## 2010-10-16 DIAGNOSIS — G4700 Insomnia, unspecified: Secondary | ICD-10-CM

## 2010-10-16 DIAGNOSIS — F40298 Other specified phobia: Secondary | ICD-10-CM

## 2010-10-16 MED ORDER — KETOPROFEN 50 MG PO CAPS: 50.0000 mg | ORAL_CAPSULE | Freq: Four times a day (QID) | ORAL | Status: DC | PRN

## 2010-10-16 MED ORDER — LIDOCAINE 5 % EX OINT: TOPICAL_OINTMENT | CUTANEOUS | Status: DC | PRN

## 2010-10-16 MED ORDER — AMBIEN 10 MG PO TABS: 10.0000 mg | ORAL_TABLET | Freq: Every evening | ORAL | Status: DC | PRN

## 2010-10-16 MED ORDER — RIZATRIPTAN BENZOATE 10 MG PO TABS: 10.0000 mg | ORAL_TABLET | ORAL | Status: DC | PRN

## 2010-10-16 MED ORDER — IBUPROFEN 800 MG PO TABS: 800.0000 mg | ORAL_TABLET | Freq: Two times a day (BID) | ORAL | Status: DC

## 2010-10-16 MED ORDER — PROMETHAZINE HCL 25 MG PO TABS: 25.0000 mg | ORAL_TABLET | Freq: Four times a day (QID) | ORAL | Status: DC | PRN

## 2010-10-16 NOTE — Progress Notes (Signed)
Subjective:    Patient ID: Karen Spears, female    DOB: 17-Sep-1955, 55 y.o.   MRN: [id number]  HPI Patient comes in for a wellness visit today. Since her last checkup she is actually done fairly well. 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 1 week on campus and 12 weeks on line. Each semester. He is very busy and intense but she likes it.  She thinks she may have had her first panic attack when she was doing a wedding service  and people were closed and it felt like claustrophobia. However she was managed to get through it. She has no fear or anxiety with large groups. Mostly just in closed conditions.  Pain: She has chronic pain but tolerates it pretty well has had a couple flares when she had to walk up a number of steps and do some lifting. She sometimes uses local lidocaine pain medicine is tolerating and in general.   Sleep: She is only taking a third of a tablet of the Ambien. Hard time getting off it.  and if she lacks sleep she gets a migraine. We'll be trying to wean off of this at some point Ocas yeast infections Review of Systems Negative for major changes in vision hearing chest pain shortness of breath change in bowel habits blood in her stool. No new orthopedic problems. Back is better said she got a new car as per history of present illness Past history family history social history reviewed in the electronic medical record.     Objective:   Physical Exam Physical Exam: Vital signs reviewed WUJ:WJXB is a well-developed well-nourished alert cooperative  white female who appears her stated age in no acute distress.  HEENT: normocephalic  traumatic , Eyes: PERRL EOM's full, conjunctiva clear, Nares: paten,t no deformity discharge or tenderness., Ears: no deformity EAC's clear TMs with normal landmarks. Mouth: clear OP, no lesions, edema.  Moist mucous membranes. Dentition in adequate  repair. NECK: supple without masses, thyromegaly or bruits. CHEST/PULM:  Clear to auscultation and percussion breath sounds equal no wheeze , rales or rhonchi. No chest wall deformities or tenderness. Breasts: breasts appear normal, no suspicious masses, no skin or nipple changes or axillary nodes. LN: no cervical axillary inguinal adenopathy CV: PMI is nondisplaced, S1 S2 no gallops, murmurs, rubs. Peripheral pulses are full without delay.No JVD .  ABDOMEN: Bowel sounds normal nontender  No guard or rebound, no hepato splenomegal no CVA tenderness.  No hernia. She has a mild tenderness in the left lower groin inguinal area. No hernia Extremtities:  No clubbing cyanosis or edema, no acute joint swelling or redness no focal atrophy NEURO:  Oriented x3, cranial nerves 3-12 appear to be intact, no obvious focal weakness,gait within normal limits no abnormal reflexes or asymmetrical SKIN: No acute rashes normal turgor, color, no bruising or petechiae.  One small 2 mm rough area side of right nose  Pink  PSYCH: Oriented, good eye contact, no obvious depression anxiety, cognition and judgment appear normal. Labs reviewed .      Assessment & Plan:  Preventive Health Care She is up to date.Counseled regarding healthy nutrition, exercise, sleep, injury prevention, calcium vit d and healthy weight . Not allow time for colonoscopy now so will do stool cards for colon cancer screening.  MHas fairly well controlled and knows her triggers. No alarm features Chronic pain s/p surgery neuralgia  Left groin. She is tolerating mass suggest she have some lidocaine patches to use as needed if she has to do extra activity that is unplanned. HRT  Anxiety  had a panic or anxiety attack which is unusual for her although she does have some anxiety in general. At this time she does not want to try medication but if she finds herself in a situation all stress she can call us and we will prescribe some short-term  Xanax.  Skin: Unsure if this small rough area on the right nose is significant. If persistent or progressive would have her see dermatology.

## 2010-10-16 NOTE — Patient Instructions (Signed)
Continue lifestyle intervention healthy eating and exercise . Call  If needed.     For pre medication  .  Anxiety med. If skin area on nose doesn't get  Better in another months then see dermatologist .  Check up  In a year or as needed.

## 2010-11-18 ENCOUNTER — Telehealth: Payer: Self-pay | Admitting: *Deleted

## 2010-11-18 MED ORDER — ALPRAZOLAM 0.25 MG PO TABS: 0.2500 mg | ORAL_TABLET | Freq: Three times a day (TID) | ORAL | Status: AC | PRN

## 2010-11-18 NOTE — Telephone Encounter (Signed)
Ok to refill x 1  

## 2010-11-18 NOTE — Telephone Encounter (Signed)
Xanax .25  1 po tid prn anxiety.   Disp 24  Refill x 1

## 2010-11-18 NOTE — Telephone Encounter (Signed)
Refill on xanax 

## 2010-11-18 NOTE — Telephone Encounter (Signed)
I checked both epic and EMR. I spoke to pt and she was told that we could just call it in at her last cpx.

## 2010-11-18 NOTE — Telephone Encounter (Signed)
Rx called in 

## 2010-12-03 ENCOUNTER — Other Ambulatory Visit: Payer: Self-pay | Admitting: Dermatology

## 2011-01-27 ENCOUNTER — Other Ambulatory Visit: Payer: Self-pay | Admitting: *Deleted

## 2011-01-27 NOTE — Telephone Encounter (Signed)
Rx Refill zolpidem tartrate tabs 10 mg # 30

## 2011-01-27 NOTE — Telephone Encounter (Signed)
Ok x 2  

## 2011-01-28 MED ORDER — ZOLPIDEM TARTRATE 10 MG PO TABS: 10.0000 mg | ORAL_TABLET | Freq: Every evening | ORAL | Status: DC | PRN

## 2011-01-28 NOTE — Telephone Encounter (Signed)
Spoke to pt and she wants to local pharmacy. Pt aware that rx is going to be sent to [facility].

## 2011-01-28 NOTE — Telephone Encounter (Signed)
Need to make sure it's okay to send to [facility]. Left message on machine about this.

## 2011-03-13 ENCOUNTER — Other Ambulatory Visit: Payer: Self-pay | Admitting: Internal Medicine

## 2011-04-07 ENCOUNTER — Telehealth: Payer: Self-pay | Admitting: Internal Medicine

## 2011-04-22 ENCOUNTER — Encounter: Payer: Self-pay | Admitting: Internal Medicine

## 2011-04-22 ENCOUNTER — Ambulatory Visit (INDEPENDENT_AMBULATORY_CARE_PROVIDER_SITE_OTHER): Payer: PRIVATE HEALTH INSURANCE | Admitting: Internal Medicine

## 2011-04-22 VITALS — BP 120/80 | HR 80 | Temp 98.2°F | Wt 150.0 lb

## 2011-04-22 DIAGNOSIS — J309 Allergic rhinitis, unspecified: Secondary | ICD-10-CM

## 2011-04-22 DIAGNOSIS — J329 Chronic sinusitis, unspecified: Secondary | ICD-10-CM

## 2011-04-22 DIAGNOSIS — J029 Acute pharyngitis, unspecified: Secondary | ICD-10-CM

## 2011-04-22 DIAGNOSIS — J069 Acute upper respiratory infection, unspecified: Secondary | ICD-10-CM

## 2011-04-22 MED ORDER — FLUCONAZOLE 150 MG PO TABS: 150.0000 mg | ORAL_TABLET | Freq: Once | ORAL | Status: AC

## 2011-04-22 MED ORDER — LEVOFLOXACIN 750 MG PO TABS: 750.0000 mg | ORAL_TABLET | Freq: Every day | ORAL | Status: DC

## 2011-04-22 NOTE — Progress Notes (Signed)
  Subjective:    Patient ID: Karen Spears, female    DOB: 10/28/1955, 55 y.o.   MRN: [id number]  HPI Patient comes in today for SDA  For acute problem evaluation. Onset around end  of October like a cold and never got  Totally better and over the last 3 days getting worse.  Self Treated.  for allergy. Now has Lots of phelgm and cougin badly interm=ittently felt from postnasal drainage has some mild Face pain. More on the left. Tends to get this being a problem usually in the winter. Has history of side effects of a number of medications.  States the last medicine helped a lot but may have given her a headache.  Diffuse infections all the time if she takes antibiotics,. Says she can't take fluconazole just "tears her up" makes her sensitive especially the topicals. However she does request a prescription at a day think she will tolerate it fine.  All family has been sick at some point. She hasn't had the flu shot but hasn't had a high fever. 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 She is learning to live with it. No UTI symptoms  Past history family history social history reviewed in the electronic medical record.     Objective:   Physical Exam  WDWN in NAD  quiet respirations; mildly congested  somewhat hoarse. Non toxic . HEENT: Normocephalic ;atraumatic , Eyes;  PERRL, EOMs  Full, lids and conjunctiva clear,,Ears: no deformities, canals nl, TM landmarks normal, Nose: no deformity or discharge but congested;face minimally tender on the left Mouth : OP clear without lesion or edema . There is +1 redness but no lesion Neck: Supple without adenopathy or masses or bruits Chest:  Clear to A&P without wheezes rales or rhonchi CV:  S1-S2 no gallops or murmurs peripheral perfusion is  normal Skin :nl perfusion and no acute rashes  Rapid strep negative     Assessment & Plan:  Prolonged upper respiratory symptoms 2 months getting worse either acute on chronic or a secondary sinusitis with history of same.  Discussed side effects with various antibiotics and medications she should be able to take the Levaquin and Diflucan x1  continue her nasal saline and allergy medications.  Expectant management.

## 2011-04-22 NOTE — Patient Instructions (Signed)
Treat for sinusitis  Yeast treatment as directed  Continue saline washes call if needed.

## 2011-04-28 ENCOUNTER — Telehealth: Payer: Self-pay | Admitting: *Deleted

## 2011-04-28 NOTE — Telephone Encounter (Signed)
Refill on ambien 10mg  last filled on 03/19/11 #30

## 2011-04-29 ENCOUNTER — Telehealth: Payer: Self-pay | Admitting: *Deleted

## 2011-04-29 MED ORDER — LEVOFLOXACIN 750 MG PO TABS: 750.0000 mg | ORAL_TABLET | Freq: Every day | ORAL | Status: AC

## 2011-04-29 MED ORDER — AMBIEN 10 MG PO TABS: 10.0000 mg | ORAL_TABLET | Freq: Every evening | ORAL | Status: DC | PRN

## 2011-04-29 NOTE — Telephone Encounter (Signed)
rx called in

## 2011-04-29 NOTE — Telephone Encounter (Signed)
Call in #30 with no rf  

## 2011-04-29 NOTE — Telephone Encounter (Signed)
Finished up antibiotic and still has the sinus pressure. It is also in her rt ear.

## 2011-04-29 NOTE — Telephone Encounter (Signed)
Call in another 7 days of Levaquin

## 2011-05-12 ENCOUNTER — Ambulatory Visit (INDEPENDENT_AMBULATORY_CARE_PROVIDER_SITE_OTHER): Payer: PRIVATE HEALTH INSURANCE | Admitting: Internal Medicine

## 2011-05-12 ENCOUNTER — Encounter: Payer: Self-pay | Admitting: Internal Medicine

## 2011-05-12 VITALS — BP 120/80 | HR 78 | Temp 97.8°F | Wt 146.0 lb

## 2011-05-12 DIAGNOSIS — J309 Allergic rhinitis, unspecified: Secondary | ICD-10-CM

## 2011-05-12 DIAGNOSIS — L270 Generalized skin eruption due to drugs and medicaments taken internally: Secondary | ICD-10-CM

## 2011-05-12 DIAGNOSIS — J329 Chronic sinusitis, unspecified: Secondary | ICD-10-CM

## 2011-05-12 DIAGNOSIS — J069 Acute upper respiratory infection, unspecified: Secondary | ICD-10-CM

## 2011-05-12 MED ORDER — PREDNISONE 20 MG PO TABS: ORAL_TABLET | ORAL | Status: AC

## 2011-05-12 MED ORDER — DOXYCYCLINE HYCLATE 100 MG PO CAPS: 100.0000 mg | ORAL_CAPSULE | Freq: Two times a day (BID) | ORAL | Status: AC

## 2011-05-12 NOTE — Patient Instructions (Signed)
Take  Prednisone for  Allergic reaction  After 2 antibiotics  Caution beginning another one at this time  However  Consider doxycyline if not getting better

## 2011-05-12 NOTE — Progress Notes (Signed)
  Subjective:    Patient ID: Karen Spears, female    DOB: August 16, 1955, 55 y.o.   MRN: [id number]  HPI  Patient comes in today for SDA  For acute problem evaluation. See phone notes about resp sx .  Never got totally better  And then got   Worse this weekend with face pressure and cough and hoarse .  Got rash on nose and chin  ? From med  diflucan and" tore her up "   But helps hx of other rx in past terazole ? Can take this.  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 Review of Systems No cp sob  Fatigue no fever    Past history family history social history reviewed in the electronic medical record.     Objective:   Physical Exam WDWN in nad mildly hoarse and congested with rash Face right nares pink area and under left chin with 3 cm ovoid  Dark pinkish flat and no vesicle or discharge  Op clear tms clear  Nose mildly congested  Chest:  Clear to A&P without wheezes rales or rhonchi CV:  S1-S2 no gallops or murmurs peripheral perfusion is normal     Assessment & Plan:  Never cleared  Sinus pain    And hoarse at times   Rash nose and neck    Poss drug rash reaction see above indeed  Should not take fluconazole  Unsure if problem with topical azoles  Per hx   Se of meds  . says she Can use  Terazole.   Protracted ur sx   Would holl on other antibiotic at this point    Poss allergic reactive phenom vs new exposures   Disc use of  meds pred for now   rx printed for doxy in case as she travels   And holiday For school but dont think face rash is staph  cause of past hx.

## 2011-05-13 DIAGNOSIS — J069 Acute upper respiratory infection, unspecified: Secondary | ICD-10-CM | POA: Insufficient documentation

## 2011-05-13 DIAGNOSIS — L270 Generalized skin eruption due to drugs and medicaments taken internally: Secondary | ICD-10-CM | POA: Insufficient documentation

## 2011-05-13 HISTORY — DX: Generalized skin eruption due to drugs and medicaments taken internally: L27.0

## 2011-06-17 ENCOUNTER — Other Ambulatory Visit: Payer: Self-pay | Admitting: Internal Medicine

## 2011-06-17 NOTE — Telephone Encounter (Signed)
Ok x 2  

## 2011-06-19 ENCOUNTER — Telehealth: Payer: Self-pay | Admitting: *Deleted

## 2011-06-19 ENCOUNTER — Other Ambulatory Visit: Payer: Self-pay

## 2011-06-19 ENCOUNTER — Emergency Department (HOSPITAL_BASED_OUTPATIENT_CLINIC_OR_DEPARTMENT_OTHER)
Admission: EM | Admit: 2011-06-19 | Discharge: 2011-06-19 | Disposition: A | Discharge status: Home / Self Care | Payer: PRIVATE HEALTH INSURANCE | Attending: Emergency Medicine | Admitting: Emergency Medicine

## 2011-06-19 ENCOUNTER — Encounter (HOSPITAL_BASED_OUTPATIENT_CLINIC_OR_DEPARTMENT_OTHER): Payer: Self-pay

## 2011-06-19 DIAGNOSIS — R42 Dizziness and giddiness: Secondary | ICD-10-CM

## 2011-06-19 DIAGNOSIS — Z79899 Other long term (current) drug therapy: Secondary | ICD-10-CM | POA: Insufficient documentation

## 2011-06-19 DIAGNOSIS — M25559 Pain in unspecified hip: Secondary | ICD-10-CM | POA: Insufficient documentation

## 2011-06-19 LAB — BASIC METABOLIC PANEL
CO2: 26 mEq/L (ref 19–32)
Chloride: 106 mEq/L (ref 96–112)
GFR calc non Af Amer: >90 mL/min (ref >90)
Glucose, Bld: 100 mg/dL — ABNORMAL HIGH (ref 70–99)
Potassium: 3.7 mEq/L (ref 3.5–5.1)
Sodium: 140 mEq/L (ref 135–145)

## 2011-06-19 LAB — CBC
Hemoglobin: 13.4 g/dL (ref 12.0–15.0)
Platelets: 225 10*3/uL (ref 150–400)
RBC: 4.6 MIL/uL (ref 3.87–5.11)
WBC: 7.9 10*3/uL (ref 4.0–10.5)

## 2011-06-19 LAB — URINALYSIS, MICROSCOPIC ONLY
Bilirubin Urine: NEGATIVE
Ketones, ur: NEGATIVE mg/dL
Nitrite: NEGATIVE
Protein, ur: NEGATIVE mg/dL
Urobilinogen, UA: 0.2 mg/dL (ref 0.0–1.0)

## 2011-06-19 LAB — DIFFERENTIAL
Lymphocytes Relative: 25 % (ref 12–46)
Lymphs Abs: 2 10*3/uL (ref 0.7–4.0)
Neutrophils Relative %: 66 % (ref 43–77)

## 2011-06-19 MED ORDER — SODIUM CHLORIDE 0.9 % IV SOLN
INTRAVENOUS | Status: DC
  Administered 2011-06-19: 12:00:00 via INTRAVENOUS

## 2011-06-19 MED ORDER — MECLIZINE HCL 25 MG PO TABS
25.0000 mg | ORAL_TABLET | Freq: Once | ORAL | Status: AC
  Administered 2011-06-19: 25 mg via ORAL
  Filled 2011-06-19: qty 1

## 2011-06-19 MED ORDER — PROMETHAZINE HCL 25 MG/ML IJ SOLN
12.5000 mg | Freq: Once | INTRAMUSCULAR | Status: DC
  Filled 2011-06-19: qty 1

## 2011-06-19 MED ORDER — MECLIZINE HCL 50 MG PO TABS: 25.0000 mg | ORAL_TABLET | Freq: Three times a day (TID) | ORAL | Status: AC | PRN

## 2011-06-19 MED ORDER — HYDROCODONE-ACETAMINOPHEN 5-325 MG PO TABS
1.0000 | ORAL_TABLET | Freq: Four times a day (QID) | ORAL | Status: AC | PRN
Start: 2011-06-19 — End: 2011-06-29

## 2011-06-19 NOTE — Telephone Encounter (Signed)
Cortisone Shots on Monday in each hip. Pt can't walk without the room spinning. This started late Wed night. BP is not reg on her bp machine. No nausea. No SOB or Chest pain. Husband did can get his on the machine. Per Dr. Fabian Sharp have pt go to [facility] to get evaluated. Pt aware of this.

## 2011-06-19 NOTE — ED Notes (Signed)
Pt reports she received 2 steroid injections Monday and has been dizzy and weak since.

## 2011-06-19 NOTE — ED Provider Notes (Signed)
History     CSN: [id number]  Arrival date & time 06/19/11  1052   First MD Initiated Contact with Patient 06/19/11 1112      Chief Complaint  Patient presents with  . Dizziness  . Hip Pain    HPI Patient presents emergency room with complaints of dizziness. Patient states it all started after she received an injection on Monday. 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 Patient states she will get these episodes off and on over the years. She went to her primary doctor and was given a cortisone injection which she has done many times in the past. Since that time however she is noted she started to feel more weak and has been feeling dizzy. Today she specifically noted that when she turns her head she noticed the room starts to spin. She looks to the left she also notices that the room spins as well. She denies any headache, trouble with her coordination, trouble with her speech, or decreased strength other than the pain and weakness in her left hip. Patient called her primary doctor and was told to come to the emergency room for evaluation. Past Medical History  Diagnosis Date  . Migraines   . Bladder polyps   . Headache   . 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 Allergic rhinitis     Past Surgical History  Procedure Date  . Abdominal hysterectomy   . Vocal cord surgery 1987    No family history on file.  History  Substance Use Topics  . Smoking status: Never Smoker   . Smokeless tobacco: Not on file  . Alcohol Use: No    OB History    Grav Para Term Preterm Abortions TAB SAB Ect Mult Living                  Review of Systems  All other systems reviewed and are negative.    Allergies  Azithromycin; Cefdinir; Fexofenadine; Fluconazole; and Penicillins  Home Medications   Current Outpatient Rx  Name Route Sig Dispense Refill  . CYCLOBENZAPRINE HCL  10 MG PO TABS Oral Take 10 mg by mouth 3 (three) times daily as needed.      Marland Kitchen HYDROCODONE-ACETAMINOPHEN 5-500 MG PO TABS Oral Take 1 tablet by mouth every 4 (four) hours as needed for pain. 30 tablet 0  . IBUPROFEN 800 MG PO TABS  TAKE ONE TABLET BY MOUTH TWICE DAILY 60 tablet 1  . KETOPROFEN 50 MG PO CAPS Oral Take 1 capsule (50 mg total) by mouth 4 (four) times daily as needed. 30 capsule 3  . LIDOCAINE 5 % EX PTCH Transdermal Place 1 patch onto the skin daily. Remove & Discard patch within 12 hours or as directed by MD     . LIDOCAINE 5 % EX OINT Topical Apply topically as needed. 35.44 g 3  . PROMETHAZINE HCL 25 MG PO TABS Oral Take 1 tablet (25 mg total) by mouth every 6 (six) hours as needed. 30 tablet 3  . RIZATRIPTAN BENZOATE 10 MG PO TABS Oral Take 1 tablet (10 mg total) by mouth as needed. May repeat in 2 hours if needed 10 tablet 3  . VIVELLE-DOT 0.1 MG/24HR TD PTTW  APPLY 1 PATCH 2 TIMES A WEEK. 8 each 5  . ZOLPIDEM TARTRATE 10 MG PO TABS Oral Take 1 tablet (10 mg  total) by mouth at bedtime as needed. PT MUST HAVE BRAND!!! 30 tablet 1    BP 165/80  Pulse 64  Ht 5\' 4"  (1.626 m)  Wt 145 lb (65.772 kg)  BMI 24.89 kg/m2  Physical Exam  Nursing note and vitals reviewed. Constitutional: She is oriented to person, place, and time. She appears well-developed and well-nourished. No distress.  HENT:  Head: Normocephalic and atraumatic.  Right Ear: External ear normal.  Left Ear: External ear normal.  Mouth/Throat: Oropharynx is clear and moist.  Eyes: Conjunctivae are normal. Right eye exhibits no discharge. Left eye exhibits no discharge. No scleral icterus.  Neck: Neck supple. No tracheal deviation present.  Cardiovascular: Normal rate, regular rhythm and intact distal pulses.   Pulmonary/Chest: Effort normal and breath sounds normal. No stridor. No respiratory distress. She has no wheezes. She has no rales.  Abdominal: Soft. Bowel sounds are normal. She exhibits no distension.  There is no tenderness. There is no rebound and no guarding.  Musculoskeletal: She exhibits tenderness. She exhibits no edema.       Left hip: She exhibits decreased range of motion and tenderness. She exhibits normal strength, no swelling and no deformity.       Lumbar back: She exhibits decreased range of motion and tenderness. She exhibits no bony tenderness, no swelling, no edema and no deformity.  Neurological: She is alert and oriented to person, place, and time. She has normal strength. No cranial nerve deficit ( no gross defecits noted) or sensory deficit. She exhibits normal muscle tone. She displays no seizure activity. Coordination normal.       No pronator drift bilateral upper extrem, able to hold both legs off bed for 5 seconds, sensation intact in all extremities, no visual field cuts, no left or right sided neglect  Skin: Skin is warm and dry. No rash noted.  Psychiatric: She has a normal mood and affect.    ED Course  Procedures (including critical care time)  Rate: 62  Rhythm: normal sinus rhythm  QRS Axis: normal  Intervals: normal  ST/T Wave abnormalities: normal  Conduction Disutrbances:none  Narrative Interpretation:   Old EKG Reviewed: none available  Labs Reviewed  BASIC METABOLIC PANEL - Abnormal; Notable for the following:    Glucose, Bld 100 (*)    All other components within normal limits  URINALYSIS, WITH MICROSCOPIC  CBC  DIFFERENTIAL  URINALYSIS, ROUTINE W REFLEX MICROSCOPIC   No results found.     MDM  I suspect her symptoms are related to peripheral vertigo. There is a clear positional component to it increases with head movement. Patient does not have any focal neurologic deficits. She does not appear to have any gait disturbance although it is somewhat difficult to completely examine this as she is having  hip pain and her gait is different than normal.  I discussed this findings with the patient and recommend that she follow up with her primary  Dr. if symptoms persist. MRI should be considered her symptoms do not resolve. Unfortunately, MRI is not available at this time here at this facility however I do not feel she needs any further testing at this time.  Regarding her hip pain, this does seem to be an exacerbation of her recurrent hip pain, possibly sciatica.  Will dc home with pain meds.        Celene Kras, MD 06/19/11 1247

## 2011-06-19 NOTE — ED Notes (Signed)
MD at bedside. 

## 2011-06-19 NOTE — ED Notes (Signed)
Pt declines phenergan at present time.

## 2011-06-19 NOTE — ED Notes (Signed)
Pt also reports urinary burning x 1 week.

## 2011-07-01 ENCOUNTER — Ambulatory Visit (INDEPENDENT_AMBULATORY_CARE_PROVIDER_SITE_OTHER): Payer: PRIVATE HEALTH INSURANCE | Admitting: Internal Medicine

## 2011-07-01 ENCOUNTER — Ambulatory Visit: Payer: PRIVATE HEALTH INSURANCE | Admitting: Internal Medicine

## 2011-07-01 ENCOUNTER — Encounter: Payer: Self-pay | Admitting: Internal Medicine

## 2011-07-01 VITALS — BP 140/90 | HR 72 | Temp 98.2°F | Wt 148.0 lb

## 2011-07-01 DIAGNOSIS — M25551 Pain in right hip: Secondary | ICD-10-CM

## 2011-07-01 DIAGNOSIS — M76899 Other specified enthesopathies of unspecified lower limb, excluding foot: Secondary | ICD-10-CM

## 2011-07-01 DIAGNOSIS — R030 Elevated blood-pressure reading, without diagnosis of hypertension: Secondary | ICD-10-CM | POA: Insufficient documentation

## 2011-07-01 DIAGNOSIS — M7070 Other bursitis of hip, unspecified hip: Secondary | ICD-10-CM | POA: Insufficient documentation

## 2011-07-01 DIAGNOSIS — R102 Pelvic and perineal pain: Secondary | ICD-10-CM

## 2011-07-01 DIAGNOSIS — M25552 Pain in left hip: Secondary | ICD-10-CM | POA: Insufficient documentation

## 2011-07-01 DIAGNOSIS — N949 Unspecified condition associated with female genital organs and menstrual cycle: Secondary | ICD-10-CM

## 2011-07-01 DIAGNOSIS — M25559 Pain in unspecified hip: Secondary | ICD-10-CM

## 2011-07-01 DIAGNOSIS — J309 Allergic rhinitis, unspecified: Secondary | ICD-10-CM

## 2011-07-01 NOTE — Progress Notes (Signed)
Subjective:    Patient ID: Karen Spears, female    DOB: 1956-01-28, 56 y.o.   MRN: [id number]  HPI Pt comesin today as follow up of ed visit for acute vertigo and "iinalbility to walk"  And? bp readings. She was evaluated and felt to have peripheral vertigo but if persitsed to consider getting MRI.  Since that visit:   BP ? Up retried old  Med b blocker for headaches andused 1/4 and dizziness went away  Did this for 3 days .  Would like to retry this med  Pain : Asked to go t integrative therapy.    This helped in the past with ehr pain but now has other pain in hips bacl etc . Dr Darol Destine pa gave pt  Hip injection for bursitis. And then the pain got worse. In the past shots had helped in the past.   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 No more stress than usual  Outpatient Encounter Prescriptions as of 07/01/2011  Medication Sig Dispense Refill  . cyclobenzaprine (FLEXERIL) 10 MG tablet Take 10 mg by mouth 3 (three) times daily as needed.        Marland Kitchen HYDROcodone-acetaminophen (NORCO) 5-325 MG per tablet Take 1 tablet by mouth every 6 (six) hours as needed.      Marland Kitchen ibuprofen (ADVIL,MOTRIN) 800 MG tablet TAKE ONE TABLET BY MOUTH TWICE DAILY  60 tablet  1  . ketoprofen (ORUDIS) 50 MG capsule Take 1 capsule (50 mg total) by mouth 4 (four) times daily as needed.  30 capsule  3  . lidocaine (LIDODERM) 5 % Place 1 patch onto the skin daily. Remove & Discard patch within 12 hours or as directed by MD       . lidocaine (XYLOCAINE) 5 % ointment Apply topically as needed.  35.44 g  3  . promethazine (PHENERGAN) 25 MG tablet Take 1 tablet (25 mg total) by mouth every 6 (six) hours as needed.  30 tablet  3  . rizatriptan (MAXALT) 10 MG tablet Take 1 tablet (10 mg total) by mouth as needed. May repeat in 2 hours if needed  10 tablet  3  . VIVELLE-DOT 0.1 MG/24HR APPLY 1 PATCH 2 TIMES A WEEK.  8 each  5  . zolpidem (AMBIEN) 10 MG tablet Take 1 tablet (10 mg total) by mouth at  bedtime as needed. PT MUST HAVE BRAND!!!  30 tablet  1    Review of Systems Neg cp sob vision hearing changes  Has ur congestion and face discomfort  Using saline no bleeding or bruising  Past history family history social history reviewed in the electronic medical record.     Objective:   Physical Exam WDWN in gait somewhat stiff non focal neuro exam.   Tender bilateral  hip area. HEENT; mid nasal congestion face minimally tender Chest:  Clear to A&P without wheezes rales or rhonchi CV:  S1-S2 no gallops or murmurs peripheral perfusion is normal  140/90 bp right 140/80  No clubbing cyanosis or edema Oriented x 3. Normal cognition, attention, speech. Not anxious or depressed appearing   Good eye contact . 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  problematic  Requests PT  Integrative therapy as they have been helpful for her past issues   .  Apparently old bp med helped her pain .  Call in name of med  Weight dis weight control and strategies  consider weight watchers for 10 # weight loss.  Congestion rhinitis   Could be viral gets se of meds   Conservative approach at this time

## 2011-07-01 NOTE — Patient Instructions (Signed)
Call about  Medication that helped your BLood pressure and dosage . We may be able to  rx this medication .   Use saline nose spray and can try plain mucinex for thinning the mucous .   If not improving in another week or so call for advice .  Will initiate referral to PT at integrative therapy for the hip bursitis and arthritis.  Plan follow  Up depending on blood pressure readings.

## 2011-07-02 ENCOUNTER — Telehealth: Payer: Self-pay | Admitting: *Deleted

## 2011-07-02 NOTE — Telephone Encounter (Signed)
Pt is taking atenolol 50mg  1/4 th tab. Pt would like to have a lower dose.

## 2011-07-03 NOTE — Telephone Encounter (Signed)
rs atenolol 25 mg take 1/2 to 1 po qd as directed   Disp 30 refill x 2

## 2011-07-04 MED ORDER — ATENOLOL 25 MG PO TABS: ORAL_TABLET | ORAL | Status: DC

## 2011-07-04 NOTE — Telephone Encounter (Signed)
Rx sent to pharmacy   

## 2011-08-22 ENCOUNTER — Other Ambulatory Visit: Payer: Self-pay

## 2011-08-22 NOTE — Telephone Encounter (Signed)
Ok to refill x 6 months 

## 2011-08-22 NOTE — Telephone Encounter (Signed)
Rx request for vivelle-dot.  Pt last seen 07/01/11.  Rx last filled 03/13/11 #8 x 5 rf.  Pls advise.  Pt has upcoming appt for 11/25/2011.

## 2011-08-25 MED ORDER — ESTRADIOL 0.1 MG/24HR TD PTTW: 1.0000 | MEDICATED_PATCH | TRANSDERMAL | Status: DC

## 2011-08-25 NOTE — Telephone Encounter (Signed)
Rx sent to pharmacy   

## 2011-09-02 ENCOUNTER — Other Ambulatory Visit: Payer: Self-pay | Admitting: *Deleted

## 2011-09-02 MED ORDER — IBUPROFEN 800 MG PO TABS: 800.0000 mg | ORAL_TABLET | Freq: Two times a day (BID) | ORAL | Status: DC | PRN

## 2011-10-08 ENCOUNTER — Other Ambulatory Visit: Payer: Self-pay

## 2011-10-08 MED ORDER — ZOLPIDEM TARTRATE 10 MG PO TABS: 10.0000 mg | ORAL_TABLET | Freq: Every evening | ORAL | Status: DC | PRN

## 2011-10-08 NOTE — Telephone Encounter (Signed)
Rx called in to pharmacy. 

## 2011-11-18 ENCOUNTER — Other Ambulatory Visit (INDEPENDENT_AMBULATORY_CARE_PROVIDER_SITE_OTHER): Payer: PRIVATE HEALTH INSURANCE

## 2011-11-18 DIAGNOSIS — Z0000 Encounter for general adult medical examination without abnormal findings: Secondary | ICD-10-CM

## 2011-11-18 LAB — POCT URINALYSIS DIPSTICK
Bilirubin, UA: NEGATIVE
Glucose, UA: NEGATIVE
Ketones, UA: NEGATIVE
Spec Grav, UA: 1.025

## 2011-11-18 LAB — BASIC METABOLIC PANEL
BUN: 20 mg/dL (ref 6–23)
CO2: 26 mEq/L (ref 19–32)
Chloride: 108 mEq/L (ref 96–112)
Glucose, Bld: 98 mg/dL (ref 70–99)
Potassium: 4.1 mEq/L (ref 3.5–5.1)

## 2011-11-18 LAB — HEPATIC FUNCTION PANEL
ALT: 13 U/L (ref 0–35)
Total Bilirubin: 0.5 mg/dL (ref 0.3–1.2)
Total Protein: 6.5 g/dL (ref 6.0–8.3)

## 2011-11-18 LAB — CBC WITH DIFFERENTIAL/PLATELET
Eosinophils Relative: 1.5 % (ref 0.0–5.0)
HCT: 38 % (ref 36.0–46.0)
Lymphs Abs: 1.4 10*3/uL (ref 0.7–4.0)
MCV: 88.4 fl (ref 78.0–100.0)
Monocytes Absolute: 0.4 10*3/uL (ref 0.1–1.0)
Platelets: 209 10*3/uL (ref 150.0–400.0)
WBC: 5.3 10*3/uL (ref 4.5–10.5)

## 2011-11-18 LAB — LIPID PANEL
Cholesterol: 149 mg/dL (ref 0–200)
VLDL: 9.4 mg/dL (ref 0.0–40.0)

## 2011-11-18 LAB — TSH: TSH: 1.98 u[IU]/mL (ref 0.35–5.50)

## 2011-11-22 ENCOUNTER — Other Ambulatory Visit: Payer: Self-pay | Admitting: Internal Medicine

## 2011-11-24 ENCOUNTER — Other Ambulatory Visit: Payer: Self-pay | Admitting: Family Medicine

## 2011-11-24 MED ORDER — IBUPROFEN 800 MG PO TABS: 800.0000 mg | ORAL_TABLET | Freq: Two times a day (BID) | ORAL | Status: DC | PRN

## 2011-11-24 NOTE — Telephone Encounter (Signed)
Pt is requesting refills of ibuprofen 800mg .  Last seen 07/01/11.  Has a cpx appt on 11/25/11.  Please advise.  Thanks!!!

## 2011-11-24 NOTE — Telephone Encounter (Signed)
Has CPE with you on 11/25/11.  Was last seen 07/01/11.  Please advise.  Thanks!!!

## 2011-11-24 NOTE — Telephone Encounter (Signed)
Sent to the pharmacy

## 2011-11-24 NOTE — Telephone Encounter (Signed)
Ok to refill x 3 

## 2011-11-25 ENCOUNTER — Encounter: Payer: Self-pay | Admitting: Internal Medicine

## 2011-11-25 ENCOUNTER — Other Ambulatory Visit: Payer: Self-pay | Admitting: Family Medicine

## 2011-11-25 ENCOUNTER — Other Ambulatory Visit (INDEPENDENT_AMBULATORY_CARE_PROVIDER_SITE_OTHER): Payer: PRIVATE HEALTH INSURANCE

## 2011-11-25 ENCOUNTER — Ambulatory Visit (INDEPENDENT_AMBULATORY_CARE_PROVIDER_SITE_OTHER): Payer: PRIVATE HEALTH INSURANCE | Admitting: Internal Medicine

## 2011-11-25 VITALS — BP 160/94 | HR 67 | Temp 98.0°F | Ht 64.0 in | Wt 147.0 lb

## 2011-11-25 DIAGNOSIS — R102 Pelvic and perineal pain unspecified side: Secondary | ICD-10-CM

## 2011-11-25 DIAGNOSIS — J309 Allergic rhinitis, unspecified: Secondary | ICD-10-CM

## 2011-11-25 DIAGNOSIS — Z0000 Encounter for general adult medical examination without abnormal findings: Secondary | ICD-10-CM

## 2011-11-25 DIAGNOSIS — R030 Elevated blood-pressure reading, without diagnosis of hypertension: Secondary | ICD-10-CM

## 2011-11-25 DIAGNOSIS — R82998 Other abnormal findings in urine: Secondary | ICD-10-CM

## 2011-11-25 DIAGNOSIS — N951 Menopausal and female climacteric states: Secondary | ICD-10-CM

## 2011-11-25 DIAGNOSIS — R8290 Unspecified abnormal findings in urine: Secondary | ICD-10-CM

## 2011-11-25 DIAGNOSIS — N949 Unspecified condition associated with female genital organs and menstrual cycle: Secondary | ICD-10-CM

## 2011-11-25 DIAGNOSIS — G4700 Insomnia, unspecified: Secondary | ICD-10-CM

## 2011-11-25 DIAGNOSIS — G43909 Migraine, unspecified, not intractable, without status migrainosus: Secondary | ICD-10-CM

## 2011-11-25 LAB — URINALYSIS, ROUTINE W REFLEX MICROSCOPIC
Nitrite: NEGATIVE
Specific Gravity, Urine: <=1.005 (ref 1.000–1.030)
Total Protein, Urine: NEGATIVE
pH: 6 (ref 5.0–8.0)

## 2011-11-25 MED ORDER — AMBIEN 10 MG PO TABS: 10.0000 mg | ORAL_TABLET | Freq: Every evening | ORAL | Status: DC | PRN

## 2011-11-25 MED ORDER — PROMETHAZINE HCL 25 MG RE SUPP: 25.0000 mg | Freq: Four times a day (QID) | RECTAL | Status: DC | PRN

## 2011-11-25 NOTE — Progress Notes (Signed)
Subjective:    Patient ID: Karen Spears, female    DOB: 1955-11-11, 56 y.o.   MRN: [id number]  HPI Patient comes in today for Preventive Health Care visit  And fu of mm problems Perineal hip nerve pain  In pt integrative therapies and helps a lot tolerates pain. taking ibu for pain most times MHAs   Ok  takes ouroudis and phenergan supp ( asking for rx)  if needed for ha. And maxalt if needed.  Sleep taking low dose 1/3 of the 10 mg ambien for now  Completed doctorate to be done next MAY spring.  Gets reg eye checks  Not a lot of time for colonoscopy now and concern that a procedure will flare her pain.  Wishes to wait until after finishes doctorate next year.  Remote hx of bladder  Polyps  stone s  Evaluated Dr Logan Bores  Now at [employer].  No hx of gross hematuria.   Review of Systems  ROS:  GEN/ HEENT: No fever, significant weight changes  Hard to lose weight  vision problems hearing changes, CV/ PULM; No chest pain shortness of breath cough, syncope,edema  change in exercise tolerance. GI /GU: No adominal pain, vomiting, change in bowel habits. No blood in the stool. No significant GU symptoms. Except as above  No hematuria  SKIN/HEME: ,no acute skin rashes suspicious lesions or bleeding. No lymphadenopathy, nodules, masses.  NEURO/ PSYCH:  No neurologic signs such as weakness numbness. No depression anxiety. IMM/ Allergy: No unusual infections.  Allergy .   REST of 12 system review negative except as per HPI  ROS  Outpatient Encounter Prescriptions as of 11/25/2011  Medication Sig Dispense Refill  . atenolol (TENORMIN) 25 MG tablet 1/2 to 1 daily as directed  30 tablet  2  . cyclobenzaprine (FLEXERIL) 10 MG tablet Take 10 mg by mouth 3 (three) times daily as needed.        Marland Kitchen estradiol (VIVELLE-DOT) 0.1 MG/24HR Place 1 patch (0.1 mg total) onto the skin 2 (two) times a week.  8 patch  6  . HYDROcodone-acetaminophen (NORCO) 5-325 MG per tablet Take 1 tablet by mouth every 6 (six)  hours as needed.      Marland Kitchen ibuprofen (ADVIL,MOTRIN) 800 MG tablet Take 1 tablet (800 mg total) by mouth 2 (two) times daily as needed for pain.  60 tablet  2  . ketoprofen (ORUDIS) 50 MG capsule Take 1 capsule (50 mg total) by mouth 4 (four) times daily as needed.  30 capsule  3  . lidocaine (LIDODERM) 5 % Place 1 patch onto the skin daily. Remove & Discard patch within 12 hours or as directed by MD       . lidocaine (XYLOCAINE) 5 % ointment Apply topically as needed.  35.44 g  3  . promethazine (PHENERGAN) 25 MG tablet Take 1 tablet (25 mg total) by mouth every 6 (six) hours as needed.  30 tablet  3  . rizatriptan (MAXALT) 10 MG tablet Take 1 tablet (10 mg total) by mouth as needed. May repeat in 2 hours if needed  10 tablet  3  . zolpidem (AMBIEN) 10 MG tablet Take 1 tablet (10 mg total) by mouth at bedtime as needed. PT MUST HAVE BRAND!!!  30 tablet  2  . DISCONTD: AMBIEN 10 MG tablet Take 1 tablet (10 mg total) by mouth at bedtime as needed for sleep.  30 tablet  0       Objective:   Physical Exam BP  160/94  Pulse 67  Temp 98 F (36.7 C) (Oral)  Ht 5\' 4"  (1.626 m)  Wt 147 lb (66.679 kg)  BMI 25.23 kg/m2  SpO2 98% Repeat bp better 130/88 Physical Exam: Vital signs reviewed ZOX:WRUE is a well-developed well-nourished alert cooperative  white female who appears her stated age in no acute distress.  HEENT: normocephalic atraumatic , Eyes: PERRL EOM's full, conjunctiva clear, Nares: paten,t no deformity discharge or tenderness., Ears: no deformity EAC's clear TMs with normal landmarks. Mouth: clear OP, no lesions, edema.  Moist mucous membranes. Dentition in adequate repair. NECK: supple without masses, thyromegaly or bruits. CHEST/PULM:  Clear to auscultation and percussion breath sounds equal no wheeze , rales or rhonchi. No chest wall deformities or tenderness. CV: PMI is nondisplaced, S1 S2 no gallops, murmurs, rubs. Peripheral pulses are full without delay.No JVD .  ABDOMEN: Bowel  sounds normal nontender  No guard or rebound, no hepato splenomegal no CVA tenderness.  No hernia. Extremtities:  No clubbing cyanosis or edema, no acute joint swelling or redness no focal atrophy NEURO:  Oriented x3, cranial nerves 3-12 appear to be intact, no obvious focal weakness,gait within normal limits no abnormal reflexes or asymmetrical SKIN: No acute rashes normal turgor, color, no bruising or petechiae. PSYCH: Oriented, good eye contact, no obvious depression anxiety, cognition and judgment appear normal. LN: no cervical axillary inguinal adenopathy  Lab Results  Component Value Date   WBC 5.3 11/18/2011   HGB 12.6 11/18/2011   HCT 38.0 11/18/2011   PLT 209.0 11/18/2011   GLUCOSE 98 11/18/2011   CHOL 149 11/18/2011   TRIG 47.0 11/18/2011   HDL 50.40 11/18/2011   LDLCALC 89 11/18/2011   ALT 13 11/18/2011   AST 17 11/18/2011   NA 140 11/18/2011   K 4.1 11/18/2011   CL 108 11/18/2011   CREATININE 0.7 11/18/2011   BUN 20 11/18/2011   CO2 26 11/18/2011   TSH 1.98 11/18/2011   Ua 1+ heme      Assessment & Plan:  Preventive Health Care Counseled regarding healthy nutrition, exercise, sleep, injury prevention, calcium vit d and healthy weight . Disc HCm  Stool cards needs colon but concerns about pain flare and wishes to finish doctorate first. So readdress this next year.  Chronnic perineal pain  Under pt at present managing. ABn ua hx of bladder stones and polyps no sx   Will repeat with micro at [facility].  MHAs stable and managed  Phenergan supp as needed to her meds. Sleep ongogin issues   Risk benefit of medication discussed. Pt aware and only taking 1/3 for this to avoid se.  Elevated BP readings today  Has been checked in past and ok to recheck at home.  To ensure normalcy and no  Increase risk.

## 2011-11-25 NOTE — Patient Instructions (Signed)
Repeat urinalysis with microscopic  If continuing blood then consider urologic evaluation.  Do stool cards for blood. Plan colonoscopy next year as we discussed.  Contact us about  ambien refill and quantity written.

## 2011-11-30 ENCOUNTER — Encounter: Payer: Self-pay | Admitting: Internal Medicine

## 2011-12-19 ENCOUNTER — Other Ambulatory Visit: Payer: Self-pay | Admitting: Family Medicine

## 2011-12-19 MED ORDER — ALPRAZOLAM 0.25 MG PO TABS: ORAL_TABLET | ORAL | Status: DC

## 2011-12-19 NOTE — Telephone Encounter (Signed)
Called to the pharmacy and left on voicemail. 

## 2011-12-19 NOTE — Telephone Encounter (Signed)
Ok to refill xanax 24 disp .25 tid prn .  Please let holly or suandrea follow this up to see whats the story about the  Faxes.

## 2011-12-19 NOTE — Telephone Encounter (Signed)
Pt is requesting a refill of this medication.  The pharmacy called saying they have sent faxes.  This is the first I have seen this.  I have no faxes from the pharmacy.  It is not in her current medication list.  Found it in the history.  She was last seen 11/25/11 CPE.  No future appt.  Please advise.

## 2011-12-31 ENCOUNTER — Ambulatory Visit (INDEPENDENT_AMBULATORY_CARE_PROVIDER_SITE_OTHER): Payer: PRIVATE HEALTH INSURANCE | Admitting: Internal Medicine

## 2011-12-31 ENCOUNTER — Encounter: Payer: Self-pay | Admitting: Internal Medicine

## 2011-12-31 VITALS — BP 170/104 | HR 89 | Temp 98.0°F | Wt 144.0 lb

## 2011-12-31 DIAGNOSIS — F439 Reaction to severe stress, unspecified: Secondary | ICD-10-CM

## 2011-12-31 DIAGNOSIS — F4320 Adjustment disorder, unspecified: Secondary | ICD-10-CM

## 2011-12-31 DIAGNOSIS — R030 Elevated blood-pressure reading, without diagnosis of hypertension: Secondary | ICD-10-CM

## 2011-12-31 DIAGNOSIS — G4700 Insomnia, unspecified: Secondary | ICD-10-CM

## 2011-12-31 HISTORY — DX: Reaction to severe stress, unspecified: F43.9

## 2011-12-31 MED ORDER — ALPRAZOLAM 0.5 MG PO TABS: 0.5000 mg | ORAL_TABLET | Freq: Three times a day (TID) | ORAL | Status: DC | PRN

## 2011-12-31 NOTE — Patient Instructions (Signed)
Agree with   Using the anti anxiety   Medication   As we discussed  Would prefer not taking with ambien.   ROV  in about 2 weeks  Or earlier if needed
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
## 2012-01-16 ENCOUNTER — Other Ambulatory Visit: Payer: Self-pay | Admitting: Family Medicine

## 2012-01-16 MED ORDER — ALPRAZOLAM 0.5 MG PO TABS: 0.5000 mg | ORAL_TABLET | Freq: Three times a day (TID) | ORAL | Status: DC | PRN

## 2012-02-25 ENCOUNTER — Other Ambulatory Visit: Payer: Self-pay | Admitting: *Deleted

## 2012-02-25 ENCOUNTER — Other Ambulatory Visit: Payer: Self-pay

## 2012-02-25 MED ORDER — ALPRAZOLAM 0.5 MG PO TABS: 0.5000 mg | ORAL_TABLET | Freq: Three times a day (TID) | ORAL | Status: DC | PRN

## 2012-02-25 NOTE — Telephone Encounter (Signed)
Pt called upset that her Xanax had not been filled yet, states she has called for 2 days with no response, she is out now.  Reviewing med after phone call, historical med list shows xanax last filled 01-16-12, #30 with 1 refill.  Sig is one tab TID.

## 2012-02-25 NOTE — Progress Notes (Signed)
Error

## 2012-02-25 NOTE — Telephone Encounter (Signed)
Ok to refill the xanax  60 #  Please  Have suandrea or holly track why refills are delayed  Please contact patient and or pharmacy an see what is happening . There is nothing  I can find  In the the EHR  About request for meds .  This happened to this patient before  Please advise.

## 2012-02-25 NOTE — Telephone Encounter (Signed)
Called and left on voicemail at the pharmacy.

## 2012-03-03 ENCOUNTER — Other Ambulatory Visit: Payer: Self-pay | Admitting: Internal Medicine

## 2012-03-04 ENCOUNTER — Ambulatory Visit (INDEPENDENT_AMBULATORY_CARE_PROVIDER_SITE_OTHER): Payer: PRIVATE HEALTH INSURANCE | Admitting: Internal Medicine

## 2012-03-04 ENCOUNTER — Encounter: Payer: Self-pay | Admitting: Internal Medicine

## 2012-03-04 VITALS — BP 138/86 | HR 66 | Temp 97.7°F | Wt 149.0 lb

## 2012-03-04 DIAGNOSIS — R030 Elevated blood-pressure reading, without diagnosis of hypertension: Secondary | ICD-10-CM

## 2012-03-04 DIAGNOSIS — F4320 Adjustment disorder, unspecified: Secondary | ICD-10-CM

## 2012-03-04 DIAGNOSIS — R51 Headache: Secondary | ICD-10-CM

## 2012-03-04 DIAGNOSIS — Z23 Encounter for immunization: Secondary | ICD-10-CM

## 2012-03-04 DIAGNOSIS — F439 Reaction to severe stress, unspecified: Secondary | ICD-10-CM

## 2012-03-04 MED ORDER — CLONIDINE HCL 0.1 MG PO TABS: 0.1000 mg | ORAL_TABLET | Freq: Two times a day (BID) | ORAL | Status: DC

## 2012-03-04 MED ORDER — ALPRAZOLAM 0.5 MG PO TABS: 0.5000 mg | ORAL_TABLET | Freq: Three times a day (TID) | ORAL | Status: DC | PRN

## 2012-03-04 NOTE — Patient Instructions (Signed)
Trial of clonidine .1 mg  Twice  Day. Contact us after a week or so . Back up plan would be to increase atenolol to 2 twice a day.   Refill xanax today.  Flu vaccine today Check into reimbursement for shingles vaccine.  And can given nytime. Advise ROV in 1-2 months or as needed.  Monitor Bp readings.

## 2012-03-04 NOTE — Progress Notes (Signed)
  Subjective:    Patient ID: Karen Spears, female    DOB: 1956/05/10, 56 y.o.   MRN: [id number]  HPI Pt comesin for fu of medication and stress reaction.  Has seen counselor about 2 x per week and now some better 1 x per week  takng xanax tid  Dosing and stopped the [place] ans better sleeping still not back to work.   Having BP readings up and down .  Taking atenolol 1/2 25 prn but tried over the last week  Concerned about se such as sexual side effects.  Dr  Farrel Demark suggested a trial of alpha blocker  Such as prazosin or clnidine vs b blocker for adrenaline sx.  Taking ibu at night  And helps.  Just got refill  Of xanax issues with pharmacy getting [place] request in a timely manner.  Asks about shingles vaccine Review of Systems No cp sob vision changes  No bleeding  Pelvic pain no change  Working on dissertation for doctorated not working otherwise  fam  Child had bipolar had rx to meds currently doing well functioning  "Just uses tobacco"    Objective:   Physical Exam BP 138/86  Pulse 66  Temp 97.7 F (36.5 C) (Oral)  Wt 149 lb (67.586 kg)  SpO2 97% wdwn in nad animated animated Oriented x 3 and no noted deficits in memory, attention, and speech.  Reviewed notes from Dr  Laurell Roof  PHD  [facility]    Assessment & Plan:  ptsd type sx  Labile bp readings  Could be related to stress but does have has and has low dose b blocker  Hesitant ot increase for caution of se.  Also fam hx of se of ssri .   HAs stable  On b blocker.  Sleep and stress medication xanax tid for now  Options discussed  All have risk of se vs benefit.   Actually better than in past.  Trial clonidine .1 bid and fu 3-4 weeks but call in  Interim.  Second back up options to increaes aten to 25 bid  . Refill xanax 90 today.  Continue counseling.  Call for refills  Total visit > 50% spent counseling and coordinating care

## 2012-03-12 ENCOUNTER — Telehealth: Payer: Self-pay | Admitting: Family Medicine

## 2012-03-12 NOTE — Telephone Encounter (Signed)
Pt is requesting a release letter saying she return to work on 04/04/12.  Please do not put a dx on it per the pt.  She would like to pick this up by 03/17/12.  She can be contacted at [phone], [phone] or [phone].

## 2012-03-16 ENCOUNTER — Encounter: Payer: Self-pay | Admitting: Internal Medicine

## 2012-03-16 NOTE — Telephone Encounter (Signed)
Pt will try taking 1 clonidine in the am and 2 qhs for a few nights.  Will call back if this helps for new rx.  If it does not help, will then discuss other options with WP.

## 2012-03-16 NOTE — Telephone Encounter (Signed)
Will do . Letter is written .  How are  her blood pressure and other sx.? Is she still taking the clonidine?

## 2012-03-16 NOTE — Telephone Encounter (Signed)
We can increase the xanax to 1/2 am 1 pm  But other option would be to increase the clonidine  Slightly to 0.1 in am and 0.2 mg in pm  And see what bp does.

## 2012-03-16 NOTE — Telephone Encounter (Signed)
BP is still around 138/85.  Her psychologist told her it should be around 115/70s for her "brain to slow down."  Still taking clonidine.  Taking 1/4 Xanax during the day and a whole 0.5 nightly.  Wanted to know if she should increase Xanax?  Please advise.  Thanks!!!  Pt notified to pick up her letter.

## 2012-04-05 ENCOUNTER — Other Ambulatory Visit: Payer: Self-pay | Admitting: Internal Medicine

## 2012-04-05 NOTE — Telephone Encounter (Signed)
The patient called to inform you that she continues to have high BP with the clonidine.  It does spike at times like you said it would.  However, the past few days it has been 180s/90 or 150s/80s.  She would like to know if there is a lower dose atenolol to try.  Would also like refills on maxalt and ketoprofen.  Please advise.  Thanks!!

## 2012-04-05 NOTE — Telephone Encounter (Signed)
Ketoprofen ok to refill x 1 and maxalt x 1  Either of these can make high blood pressure worse.    ROV to figure out the blood pressure situation management.

## 2012-04-06 ENCOUNTER — Ambulatory Visit (INDEPENDENT_AMBULATORY_CARE_PROVIDER_SITE_OTHER): Payer: PRIVATE HEALTH INSURANCE | Admitting: Internal Medicine

## 2012-04-06 ENCOUNTER — Encounter: Payer: Self-pay | Admitting: Internal Medicine

## 2012-04-06 VITALS — BP 160/70 | HR 104 | Temp 97.8°F | Wt 147.0 lb

## 2012-04-06 DIAGNOSIS — G4700 Insomnia, unspecified: Secondary | ICD-10-CM

## 2012-04-06 DIAGNOSIS — T887XXA Unspecified adverse effect of drug or medicament, initial encounter: Secondary | ICD-10-CM

## 2012-04-06 DIAGNOSIS — F439 Reaction to severe stress, unspecified: Secondary | ICD-10-CM

## 2012-04-06 DIAGNOSIS — R030 Elevated blood-pressure reading, without diagnosis of hypertension: Secondary | ICD-10-CM

## 2012-04-06 DIAGNOSIS — F4320 Adjustment disorder, unspecified: Secondary | ICD-10-CM

## 2012-04-06 MED ORDER — ATENOLOL 25 MG PO TABS: ORAL_TABLET | ORAL | Status: DC

## 2012-04-06 MED ORDER — RIZATRIPTAN BENZOATE 10 MG PO TABS: 10.0000 mg | ORAL_TABLET | ORAL | Status: DC | PRN

## 2012-04-06 MED ORDER — KETOPROFEN 50 MG PO CAPS: 50.0000 mg | ORAL_CAPSULE | Freq: Four times a day (QID) | ORAL | Status: DC | PRN

## 2012-04-06 NOTE — Progress Notes (Signed)
Chief Complaint  Patient presents with  . Follow-up    BP medication    HPI:  Patient comes in for followup of blood pressure management as well as medication management see phone message. She been placed on clonidine to help some symptoms of posttraumatic stress and also possibly blood pressure the clonidine did help her sleep and decrease her pain however at higher doses made her dream different and her brain felt like she was in a fog. 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  But a larger parish.  She would like to keep her medical humming [place] at this point in time. She does have concern with a number of steps that'll be needed to go into her new residence and that will flareup her hip pelvis problem. This is been managed by physical therapy and pain management medications. Her blood pressure has been up and down she did not take the clonidine today because of the possibility of taking any medication. She's been on atenolol in the past for headache suppression and very low dose which was helpful but did have sexual dysfunction which she does not feel it's acceptable. However she know she can tolerated other wise.   Clonidine helped hip pain but had se of higher dose. She is now taking Xanax  .75  at night Xanax 1/4 as needed and as night. Asks if that would be better to go back on Ambien. ROS: See pertinent positives and negatives per HPI. Her anxiety is some better has not been to the counselor in the last week.  Past Medical History  Diagnosis Date  . Migraines   . Bladder polyps     and stones dr Logan Bores   . Headache   . 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 Allergic rhinitis   . MIGRAINE HEADACHE 03/29/2009    Qualifier: Diagnosis of  By: Fabian Sharp MD, Neta Mends     No family history on  file.  History   Social History  . Marital Status: Married    Spouse Name: N/A    Number of Children: N/A  . Years of Education: N/A   Social History Main Topics  . Smoking status: Never Smoker   . Smokeless tobacco: None  . Alcohol Use: No  . Drug Use: No  . Sexually Active:    Other Topics Concern  . None   Social History Narrative   Occupation: [employer]  going to [employer]. To finishingMarriedRegular exercise- no someHas grandchildren    Outpatient Encounter Prescriptions as of 04/06/2012  Medication Sig Dispense Refill  . atenolol (TENORMIN) 25 MG tablet Take 25 mg per day.  30 tablet  2  . cloNIDine (CATAPRES) 0.1 MG tablet Take by mouth. Take 0.1mg  qam and 0.2 qhs.      . cyclobenzaprine (FLEXERIL) 10 MG tablet Take 10 mg by mouth 3 (three) times daily as needed.        Marland Kitchen HYDROcodone-acetaminophen (NORCO) 5-325 MG per tablet Take 1 tablet by mouth every 6 (six) hours as needed.      Marland Kitchen ibuprofen (ADVIL,MOTRIN) 800 MG tablet Take 1 tablet (800 mg total) by mouth 2 (two) times daily as needed for pain.  60 tablet  2  .  ketoprofen (ORUDIS) 50 MG capsule Take 1 capsule (50 mg total) by mouth 4 (four) times daily as needed.  30 capsule  0  . lidocaine (LIDODERM) 5 % Place 1 patch onto the skin daily. Remove & Discard patch within 12 hours or as directed by MD       . lidocaine (XYLOCAINE) 5 % ointment Apply topically as needed.  35.44 g  3  . rizatriptan (MAXALT) 10 MG tablet Take 1 tablet (10 mg total) by mouth as needed. May repeat in 2 hours if needed  10 tablet  0  . VIVELLE-DOT 0.1 MG/24HR PLACE 1 PATCH ONTO THE SKIN TWICE WEEKLY  8 each  5  . [DISCONTINUED] atenolol (TENORMIN) 25 MG tablet 1/2 to 1 daily as directed  30 tablet  2    EXAM:  BP 160/70  Pulse 104  Temp 97.8 F (36.6 C) (Oral)  Wt 147 lb (66.679 kg)  SpO2 98%  There is no height on file to calculate BMI.  GENERAL: vitals reviewed and listed above, alert, oriented, appears  well hydrated and in no acute distress alert  HEENT: atraumatic, conjunctiva  clear, no obvious abnormalities on inspection of external nose and ears   NECK: no obvious masses on inspection palpation   LUNGS:  resp unlabored   CV: HRRR, no clubbing cyanosis or  peripheral edema nl cap refill   MS: moves all extremities without noticeable focal  abnormality  PSYCH: pleasant and cooperative, not distressed today  no obvious depression but mildly anxious. No tremor .   ASSESSMENT AND PLAN:  Discussed the following assessment and plan:  1. Elevated blood pressure reading    wean clonidine and begin low dose temormin  fu bp readings and plan   2. Reaction to severe stress   3. SLEEPLESSNESS   4. Medication side effect    clonidie helped but toodrowsy and some bp rebpound at this time probably   Can take 12. 5 bid or 25 in am and see how bp has and se are doing. -Patient advised to return or notify health care team  immediately if symptoms worsen or persist or new concerns arise.  Patient Instructions  Take the equivalent  Of 25 mg  Atenolol per day 1/2 twice a day or one once a day.  Wean the clonidine  To once a day and then 1/2 and off  . To avoid rebound.  Contact    [place] in 2 weeks with readings.    And then decide on fu.    Neta Mends. Terrall Bley M.D. Total visit > 50% spent counseling and coordinating care

## 2012-04-06 NOTE — Patient Instructions (Signed)
Take the equivalent  Of 25 mg  Atenolol per day 1/2 twice a day or one once a day.  Wean the clonidine  To once a day and then 1/2 and off  . To avoid rebound.  Contact    [place] in 2 weeks with readings.    And then decide on fu.

## 2012-04-15 ENCOUNTER — Ambulatory Visit: Payer: PRIVATE HEALTH INSURANCE | Admitting: Internal Medicine

## 2012-04-28 ENCOUNTER — Other Ambulatory Visit: Payer: Self-pay | Admitting: Internal Medicine

## 2012-04-29 ENCOUNTER — Telehealth: Payer: Self-pay | Admitting: Family Medicine

## 2012-04-29 ENCOUNTER — Other Ambulatory Visit: Payer: Self-pay | Admitting: Family Medicine

## 2012-04-29 MED ORDER — ALPRAZOLAM 0.5 MG PO TABS: 0.5000 mg | ORAL_TABLET | Freq: Three times a day (TID) | ORAL | Status: DC | PRN

## 2012-04-29 MED ORDER — IBUPROFEN 800 MG PO TABS: 800.0000 mg | ORAL_TABLET | Freq: Two times a day (BID) | ORAL | Status: DC | PRN

## 2012-04-29 NOTE — Telephone Encounter (Signed)
Message sent to Dr. Fry.  Waiting on response. 

## 2012-04-29 NOTE — Telephone Encounter (Signed)
Xanax called to the pharmacy.  Ibuprofen sent by e-scribe.

## 2012-04-29 NOTE — Telephone Encounter (Signed)
Pt is requesting refills.  She was last seen on 04/06/12 and has no future appt.  Alprazolam last filled on 03/04/12 #90 with no additional refills and Ibuprofen last filled on 11/24/11 # 60 with 2 additional refills.  Please advise.  Thanks!!

## 2012-04-29 NOTE — Telephone Encounter (Signed)
Call in #60 of each with no rf

## 2012-05-18 ENCOUNTER — Ambulatory Visit (INDEPENDENT_AMBULATORY_CARE_PROVIDER_SITE_OTHER): Payer: PRIVATE HEALTH INSURANCE | Admitting: Internal Medicine

## 2012-05-18 ENCOUNTER — Encounter: Payer: Self-pay | Admitting: Internal Medicine

## 2012-05-18 VITALS — BP 154/84 | HR 66 | Temp 97.9°F | Wt 148.0 lb

## 2012-05-18 DIAGNOSIS — J069 Acute upper respiratory infection, unspecified: Secondary | ICD-10-CM

## 2012-05-18 DIAGNOSIS — R030 Elevated blood-pressure reading, without diagnosis of hypertension: Secondary | ICD-10-CM

## 2012-05-18 DIAGNOSIS — J019 Acute sinusitis, unspecified: Secondary | ICD-10-CM

## 2012-05-18 MED ORDER — CEFUROXIME AXETIL 500 MG PO TABS: 500.0000 mg | ORAL_TABLET | Freq: Two times a day (BID) | ORAL | Status: DC

## 2012-05-18 NOTE — Progress Notes (Signed)
Chief Complaint  Patient presents with  . Hoarse    Cough is productive of green sputum.  Started on 05/07/12.  Treating with OTC Robitussin and Ibuprofen.  . Nasal Congestion  . Cough    HPI: Patient comes in today for SDA for  new problem evaluation. Onset over a week ago of above symptoms that is getting worse has some face sinus pressure Coughing chunks of green  and face pressure. And is worse. We'll take some Robitussin otherwise no major treatment. 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 Blood pressure seems to be doing betterTaking 1 in am and 1/2 at night . Had nl bp reading  At dentist. This morning 120/80.  Mood is doing much better she is feeling in 6 months in [place]. And will be back to a local church is much calmer now and feels much better getting away from a dysfunctional place. Past Medical History  Diagnosis Date  . Migraines   . Bladder polyps     and stones dr Logan Bores   . Headache   . 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 Allergic rhinitis   . MIGRAINE HEADACHE 03/29/2009    Qualifier: Diagnosis of  By: Fabian Sharp MD, Neta Mends     No family history on file.  History   Social History  . Marital Status: Married    Spouse Name: N/A    Number of Children: N/A  . Years of Education: N/A   Social History Main Topics  . Smoking status: Never Smoker   . Smokeless tobacco: None  . Alcohol Use: No  . Drug Use: No  . Sexually Active:    Other Topics Concern  . None   Social History Narrative   Occupation: [employer]  going to [employer]. To finishingMarriedRegular exercise- no someHas grandchildren    Outpatient Encounter Prescriptions as of 05/18/2012  Medication Sig Dispense Refill  . ALPRAZolam (XANAX) 0.5 MG tablet Take 0.5 mg by mouth at  bedtime as needed.      Marland Kitchen atenolol (TENORMIN) 25 MG tablet Whole tab in the morning and half tab in the evening      . cyclobenzaprine (FLEXERIL) 10 MG tablet Take 10 mg by mouth 3 (three) times daily as needed.        Marland Kitchen HYDROcodone-acetaminophen (NORCO) 5-325 MG per tablet Take 1 tablet by mouth every 6 (six) hours as needed.      Marland Kitchen ibuprofen (ADVIL,MOTRIN) 800 MG tablet Take 1 tablet (800 mg total) by mouth 2 (two) times daily as needed for pain.  60 tablet  0  . ketoprofen (ORUDIS) 50 MG capsule Take 1 capsule (50 mg total) by mouth 4 (four) times daily as needed.  30 capsule  0  . lidocaine (LIDODERM) 5 % Place 1 patch onto the skin daily. Remove & Discard patch within 12 hours or as directed by MD       . lidocaine (XYLOCAINE) 5 % ointment Apply topically as needed.  35.44 g  3  . rizatriptan (MAXALT) 10 MG tablet Take 1 tablet (10 mg total) by mouth as needed. May repeat in 2 hours if needed  10 tablet  0  . VIVELLE-DOT 0.1 MG/24HR PLACE 1 PATCH ONTO THE SKIN TWICE WEEKLY  8  each  5  . [DISCONTINUED] ALPRAZolam (XANAX) 0.5 MG tablet Take 1 tablet (0.5 mg total) by mouth 3 (three) times daily as needed for sleep or anxiety.  60 tablet  0  . [DISCONTINUED] atenolol (TENORMIN) 25 MG tablet Take 25 mg per day.  30 tablet  2  . [DISCONTINUED] cloNIDine (CATAPRES) 0.1 MG tablet Take by mouth. Take 0.1mg  qam and 0.2 qhs.      . cefUROXime (CEFTIN) 500 MG tablet Take 1 tablet (500 mg total) by mouth 2 (two) times daily.  20 tablet  0    EXAM:  BP 154/84  Pulse 66  Temp 97.9 F (36.6 C) (Oral)  Wt 148 lb (67.132 kg)  SpO2 98%  There is no height on file to calculate BMI.  GENERAL: vitals reviewed and listed above, alert, oriented, appears well hydrated and in no acute distress  WDWN in NAD  quiet respirations; mildly congested  somewhat hoarse. Non toxic . HEENT: Normocephalic ;atraumatic , Eyes;  PERRL, EOMs  Full, lids and conjunctiva clear,,Ears: no deformities, canals nl, TM landmarks  normal, Nose: no deformity or discharge but congested;face minimally tender cheeks bilaterally Mouth : OP clear without lesion or edema . Neck: Supple without adenopathy or masses or bruits Chest:  Clear to A&P without wheezes rales or rhonchi CV:  S1-S2 no gallops or murmurs peripheral perfusion is normal Skin :nl perfusion and no acute rashes  PSYCH: pleasant and cooperative, no obvious depression or anxiety  ASSESSMENT AND PLAN:  Discussed the following assessment and plan:  1. Protracted URI    With laryngitis and cough  2. Acute sinusitis with symptoms greater than 10 days    history of side effects of medication reviewed the chart at length may have had a headache with Omnicef. We'll try Ceftin/ had sedation with levaquin?  3. Elevated blood pressure reading    normal at home and at the dentist will continue and monitorno change today.   Symptomatic treatment patient wants to avoid excess medication declined cough medicine at this time. She is doing much better otherwise. -Patient advised to return or notify health care team  immediately if symptoms worsen or persist or new concerns arise.  Patient Instructions  Ok to treat for sinus infection.   contact us if getting side effect.  In the meantime Continue saline .  Continue blood pressure .    Monitoring and seems ok.    Neta Mends. Panosh M.D.

## 2012-05-18 NOTE — Patient Instructions (Signed)
Ok to treat for sinus infection.   contact us if getting side effect.  In the meantime Continue saline .  Continue blood pressure .    Monitoring and seems ok.

## 2012-06-04 MED ORDER — FLUCONAZOLE 150 MG PO TABS: ORAL_TABLET | ORAL | Status: DC

## 2012-06-11 ENCOUNTER — Telehealth: Payer: Self-pay | Admitting: *Deleted

## 2012-06-11 MED ORDER — ALPRAZOLAM 0.25 MG PO TABS: ORAL_TABLET | ORAL | Status: DC

## 2012-06-11 NOTE — Telephone Encounter (Signed)
Spoke to pt told her refill for Alprazolam was called into pharmacy for her. Pt verbalized understanding.

## 2012-06-11 NOTE — Telephone Encounter (Signed)
Received phone message from pt needs refill on Alpralozam 0.25 mg one tablet by mouth 3 times a day a needed for anxiety. Please advise on refill.

## 2012-06-11 NOTE — Telephone Encounter (Signed)
Ok to refill x 1  (#60) 

## 2012-06-22 ENCOUNTER — Other Ambulatory Visit: Payer: Self-pay | Admitting: Family Medicine

## 2012-06-22 ENCOUNTER — Other Ambulatory Visit: Payer: Self-pay | Admitting: Internal Medicine

## 2012-06-23 ENCOUNTER — Telehealth: Payer: Self-pay | Admitting: *Deleted

## 2012-06-23 NOTE — Telephone Encounter (Signed)
[facility] has sent a request for Alprazolam 0.5 mg for patient is this okay to increase?

## 2012-06-23 NOTE — Telephone Encounter (Signed)
Make sure that is what the patient is requesting  Contact her please   To confirm.   and can fill as 1/2 to 1  Tid prn anxiety  Disp 60 no refills

## 2012-06-26 NOTE — Telephone Encounter (Signed)
Left message on voicemail to call office.  

## 2012-06-28 NOTE — Telephone Encounter (Signed)
Left message on home phone for the pt to return my call.  Unable to leave a message on cell.

## 2012-06-29 ENCOUNTER — Other Ambulatory Visit: Payer: Self-pay | Admitting: Family Medicine

## 2012-06-29 MED ORDER — ALPRAZOLAM 0.5 MG PO TBDP: 0.5000 mg | ORAL_TABLET | Freq: Three times a day (TID) | ORAL | Status: DC | PRN

## 2012-06-29 NOTE — Telephone Encounter (Signed)
Patient notified that the rx was called to the pharmacy.

## 2012-07-13 ENCOUNTER — Telehealth: Payer: Self-pay | Admitting: Family Medicine

## 2012-07-13 NOTE — Telephone Encounter (Signed)
The patient called today to inform that she has fever, hoarseness, sinus pressure/pain, ear pain, cough with thick yellow sputum and sore throat,  sob.  Started yesterday.  She would like an antibiotic.  She currently is living in [place] for the next 6 months.  She will be in town on Thursday and Friday of this week if she needed to make an appt.  I informed WP of all.  WP does not want to send in an antibiotic at this time but if she worsens or not getting better by Thurs or Fri than make an appt.  The pt said I could leave information of her voicemail as she may not be able to pick up the phone.  I instructed her to call as it may be a wise choice to go ahead and get on the schedule incase she is not better.  Informed her that an antibiotic will not be called in at this time.

## 2012-07-16 ENCOUNTER — Ambulatory Visit: Payer: PRIVATE HEALTH INSURANCE | Admitting: Internal Medicine

## 2012-07-23 ENCOUNTER — Other Ambulatory Visit: Payer: Self-pay | Admitting: Family Medicine

## 2012-07-23 ENCOUNTER — Other Ambulatory Visit: Payer: Self-pay | Admitting: Internal Medicine

## 2012-07-23 MED ORDER — ATENOLOL 25 MG PO TABS: ORAL_TABLET | ORAL | Status: DC

## 2012-07-23 MED ORDER — IBUPROFEN 800 MG PO TABS: ORAL_TABLET | ORAL | Status: DC

## 2012-07-30 ENCOUNTER — Encounter: Payer: Self-pay | Admitting: Internal Medicine

## 2012-07-30 ENCOUNTER — Ambulatory Visit (INDEPENDENT_AMBULATORY_CARE_PROVIDER_SITE_OTHER): Payer: PRIVATE HEALTH INSURANCE | Admitting: Internal Medicine

## 2012-07-30 VITALS — BP 132/86 | HR 62 | Temp 98.6°F | Wt 146.0 lb

## 2012-07-30 DIAGNOSIS — J019 Acute sinusitis, unspecified: Secondary | ICD-10-CM

## 2012-07-30 DIAGNOSIS — J45909 Unspecified asthma, uncomplicated: Secondary | ICD-10-CM

## 2012-07-30 DIAGNOSIS — J069 Acute upper respiratory infection, unspecified: Secondary | ICD-10-CM

## 2012-07-30 DIAGNOSIS — J0191 Acute recurrent sinusitis, unspecified: Secondary | ICD-10-CM | POA: Insufficient documentation

## 2012-07-30 DIAGNOSIS — Z77120 Contact with and (suspected) exposure to mold (toxic): Secondary | ICD-10-CM | POA: Insufficient documentation

## 2012-07-30 HISTORY — DX: Contact with and (suspected) exposure to mold (toxic): Z77.120

## 2012-07-30 HISTORY — DX: Unspecified asthma, uncomplicated: J45.909

## 2012-07-30 MED ORDER — BUDESONIDE-FORMOTEROL FUMARATE 160-4.5 MCG/ACT IN AERO: 2.0000 | INHALATION_SPRAY | Freq: Two times a day (BID) | RESPIRATORY_TRACT | Status: DC

## 2012-07-30 MED ORDER — CEFUROXIME AXETIL 500 MG PO TABS: 500.0000 mg | ORAL_TABLET | Freq: Two times a day (BID) | ORAL | Status: DC

## 2012-07-30 NOTE — Patient Instructions (Addendum)
Can add 5 more days of ceftin but staying away from exposures would be the best   We can arrange a pulmonary consult  In the meantime . If not getting  All. better  Add steroid inhaler for now  2 puff twice a day to controls asthmatic wheezing sx .  Can use the nebulizer then as needed.  ROV in 3-4 weeks  Or as needed  .

## 2012-07-30 NOTE — Progress Notes (Signed)
Chief Complaint  Patient presents with  . Follow-up    Sinusitis, Bronchitis    HPI: Patient comes in today for SDA for   problem evaluation. She's never really totally gotten better since January when she moved to [place] into [employer]. Better than 2 weeks ago but  Still a problem  And  Nebulizer helps some     Gave injection   ? Dep medrol .  Proair.  Nebulizer relaxes her lungs .      Actually battling sinus bronchitis sx for  A while and poss exposure to mold  In the house and now in a hotel.  That could be causing some sx.     Basement covered in mold.   Cleaned out the duct and said  Moldy under rafters.    Husband had some  Bronchitis  And some better  Still coughing   Moved out to a hotel or other residents 4 days ago.  Finished with pred and ceftin  Still with  Some green phlegm  and sinuses are full. The physician locally get a chest x-ray which showed inflammation in the lung she has a disc today of the x-ray discussed seeing a pulmonary doctor because of the problems. ROS: See pertinent positives and negatives per HPI.  Past Medical History  Diagnosis Date  . Migraines   . Bladder polyps     and stones dr Logan Bores   . Headache   . 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 Allergic rhinitis   . MIGRAINE HEADACHE 03/29/2009    Qualifier: Diagnosis of  By: Fabian Sharp MD, Neta Mends     No family history on file.  History   Social History  . Marital Status: Married    Spouse Name: N/A    Number of Children: N/A  . Years of Education: N/A   Social History Main Topics  . Smoking status: Never Smoker   . Smokeless tobacco: None  . Alcohol Use: No  . Drug Use: No  . Sexually Active:    Other Topics Concern  . None   Social History Narrative   Occupation: [employer]  going to [employer]. To finishing   Married   Regular exercise- no some   Has grandchildren                 Outpatient Encounter Prescriptions as of 07/30/2012  Medication Sig Dispense Refill  . ALPRAZolam (NIRAVAM) 0.5 MG dissolvable tablet Take 1 tablet (0.5 mg total) by mouth 3 (three) times daily as needed for anxiety.  60 tablet  0  . atenolol (TENORMIN) 25 MG tablet Whole tab in the morning and half tab in the evening  45 tablet  5  . cyclobenzaprine (FLEXERIL) 10 MG tablet Take 10 mg by mouth 3 (three) times daily as needed.        Marland Kitchen HYDROcodone-acetaminophen (NORCO) 5-325 MG per tablet Take 1 tablet by mouth every 6 (six) hours as needed.      Marland Kitchen ibuprofen (ADVIL,MOTRIN) 800 MG tablet TAKE ONE TABLET BY MOUTH TWICE A DAY AS NEEDED FOR PAIN  60 tablet  1  . ketoprofen (ORUDIS) 50 MG capsule Take 1 capsule (50 mg total) by mouth 4 (four) times daily as needed.  30 capsule  0  . lidocaine (LIDODERM) 5 % Place 1 patch onto the skin daily. Remove & Discard patch within 12 hours or  as directed by MD       . lidocaine (XYLOCAINE) 5 % ointment Apply topically as needed.  35.44 g  3  . rizatriptan (MAXALT) 10 MG tablet Take 1 tablet (10 mg total) by mouth as needed. May repeat in 2 hours if needed  10 tablet  0  . VIVELLE-DOT 0.1 MG/24HR PLACE 1 PATCH ONTO THE SKIN TWICE WEEKLY  8 each  5  . budesonide-formoterol (SYMBICORT) 160-4.5 MCG/ACT inhaler Inhale 2 puffs into the lungs 2 (two) times daily.  1 Inhaler  1  . cefUROXime (CEFTIN) 500 MG tablet Take 1 tablet (500 mg total) by mouth 2 (two) times daily.  14 tablet  0  . [DISCONTINUED] cefUROXime (CEFTIN) 500 MG tablet Take 1 tablet (500 mg total) by mouth 2 (two) times daily.  20 tablet  0  . [DISCONTINUED] fluconazole (DIFLUCAN) 150 MG tablet Take 1 tablet by mouth and may repeat in 3 days if needed.  2 tablet  0   No facility-administered encounter medications on file as of 07/30/2012.    EXAM:  BP 132/86  Pulse 62  Temp(Src) 98.6 F (37 C) (Oral)  Wt 146 lb (66.225 kg)  BMI 25.05 kg/m2  SpO2 98%  Body mass index is 25.05  kg/(m^2).  GENERAL: vitals reviewed and listed above, alert, oriented, appears well hydrated and in no acute distress she is mildly congested not short of breath or actively wheezing at this time.  HEENT: atraumatic, conjunctiva  clear, no obvious abnormalities on inspection of external nose and ears nose slightly congested OP : no lesion edema or exudate   NECK: no obvious masses on inspection palpation   LUNGS: clear to auscultation bilaterally, no wheezes, rales or rhonchi, good air movement at this moment  CV: HRRR, no clubbing cyanosis or  peripheral edema nl cap refill   MS: moves all extremities without noticeable focal  abnormality  PSYCH: pleasant and cooperative, no obvious depression or anxiety Chest x-ray se markings  No pna   ASSESSMENT AND PLAN:  Discussed the following assessment and plan:  Asthmatic bronchitis - Possible postinfectious reactive airway exposure to mold a could of been prolonging the symptoms.   Recurrent acute sinusitis  Exposure to mold  Protracted URI Begin Symbicort 2 puffs twice a day for the next month for hypersensitive airways presumed appears improvement with steroids bronchodilators and removals from the multi-residence. We discussed whether antibiotics would be helpful or not may not be needed but can add 5 more days. As discussed. Can use the nebulizer albuterol as needed. At this time we will hold off on pulmonary referral until she comes back in 3-4 weeks if she's not better would get specialty input. -Patient advised to return or notify health care team  if symptoms worsen or persist or new concerns arise.  Patient Instructions  Can add 5 more days of ceftin but staying away from exposures would be the best   We can arrange a pulmonary consult  In the meantime . If not getting  All. better  Add steroid inhaler for now  2 puff twice a day to controls asthmatic wheezing sx .  Can use the nebulizer then as needed.  ROV in 3-4 weeks  Or as  needed  .         Neta Mends. Panosh M.D.

## 2012-08-13 ENCOUNTER — Other Ambulatory Visit: Payer: Self-pay | Admitting: Internal Medicine

## 2012-08-13 ENCOUNTER — Telehealth: Payer: Self-pay | Admitting: Family Medicine

## 2012-08-13 MED ORDER — ALPRAZOLAM 0.5 MG PO TBDP: 0.5000 mg | ORAL_TABLET | Freq: Three times a day (TID) | ORAL | Status: DC | PRN

## 2012-08-13 NOTE — Telephone Encounter (Signed)
Last filled: 06/29/12 #60 Last seen: 07/30/12 (acute) Follow up: 08/20/12 Please advise.  Thanks!!

## 2012-08-13 NOTE — Telephone Encounter (Signed)
Refill x1 

## 2012-08-13 NOTE — Telephone Encounter (Signed)
Called and left on voicemail. 

## 2012-08-20 ENCOUNTER — Ambulatory Visit (INDEPENDENT_AMBULATORY_CARE_PROVIDER_SITE_OTHER): Payer: PRIVATE HEALTH INSURANCE | Admitting: Internal Medicine

## 2012-08-20 ENCOUNTER — Encounter: Payer: Self-pay | Admitting: Internal Medicine

## 2012-08-20 VITALS — BP 118/78 | HR 58 | Temp 97.9°F | Wt 146.0 lb

## 2012-08-20 DIAGNOSIS — J309 Allergic rhinitis, unspecified: Secondary | ICD-10-CM

## 2012-08-20 DIAGNOSIS — J9801 Acute bronchospasm: Secondary | ICD-10-CM

## 2012-08-20 DIAGNOSIS — G4700 Insomnia, unspecified: Secondary | ICD-10-CM

## 2012-08-20 DIAGNOSIS — Z77120 Contact with and (suspected) exposure to mold (toxic): Secondary | ICD-10-CM

## 2012-08-20 HISTORY — DX: Acute bronchospasm: J98.01

## 2012-08-20 MED ORDER — BUDESONIDE-FORMOTEROL FUMARATE 160-4.5 MCG/ACT IN AERO: 2.0000 | INHALATION_SPRAY | Freq: Two times a day (BID) | RESPIRATORY_TRACT | Status: DC

## 2012-08-20 MED ORDER — ESTRADIOL 0.1 MG/24HR TD PTTW: MEDICATED_PATCH | TRANSDERMAL | Status: DC

## 2012-08-20 NOTE — Progress Notes (Signed)
Chief Complaint  Patient presents with  . Follow-up    Needs a refill of her estrogen patch.    HPI: Patient comes in today for follow up of  multiple medical problems.  Respiratory doing much better taking Symbicort twice a day:  Out of house  Mold    discovered all throughout and underneath  Doing better because out of the moldy house.   Smell gets all over they're close has to wash multiple times  Stayed one  Night and cough came back .Marland Kitchen    Has to do 2 more months or so of work in the [place] area [employer] nebulizer . Only does make her a little jittery.  Planning  To  Decide ti sty in house or not.   Is feeling a good bit better.  Xanax :   Hs helps her sleep at this time  Blood pressure appears to be stable  Would like refill of her estrogen patch no side effects.   ROS: See pertinent positives and negatives per HPI.  Past Medical History  Diagnosis Date  . Migraines   . Bladder polyps     and stones dr Logan Bores   . Headache   . 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 Allergic rhinitis   . MIGRAINE HEADACHE 03/29/2009    Qualifier: Diagnosis of  By: Fabian Sharp MD, Neta Mends     No family history on file.  History   Social History  . Marital Status: Married    Spouse Name: N/A    Number of Children: N/A  . Years of Education: N/A   Social History Main Topics  . Smoking status: Never Smoker   . Smokeless tobacco: None  . Alcohol Use: No  . Drug Use: No  . Sexually Active:    Other Topics Concern  . None   Social History Narrative   Occupation: [employer]  going to [employer]. To finishing   Married   Regular exercise- no some   Has grandchildren                Outpatient Encounter Prescriptions as of 08/20/2012  Medication Sig Dispense Refill  . ALPRAZolam (NIRAVAM) 0.5 MG dissolvable tablet Take 1 tablet (0.5 mg total) by mouth 3 (three) times daily as needed for  anxiety.  60 tablet  0  . atenolol (TENORMIN) 25 MG tablet Whole tab in the morning and half tab in the evening  45 tablet  5  . budesonide-formoterol (SYMBICORT) 160-4.5 MCG/ACT inhaler Inhale 2 puffs into the lungs 2 (two) times daily.  1 Inhaler  3  . cyclobenzaprine (FLEXERIL) 10 MG tablet Take 10 mg by mouth 3 (three) times daily as needed.        Marland Kitchen estradiol (VIVELLE-DOT) 0.1 MG/24HR PLACE 1 PATCH ONTO THE SKIN TWICE WEEKLY  24 patch  3  . HYDROcodone-acetaminophen (NORCO) 5-325 MG per tablet Take 1 tablet by mouth every 6 (six) hours as needed.      Marland Kitchen ibuprofen (ADVIL,MOTRIN) 800 MG tablet TAKE ONE TABLET BY MOUTH TWICE A DAY AS NEEDED FOR PAIN  60 tablet  1  . ketoprofen (ORUDIS) 50 MG capsule Take 1 capsule (50 mg total) by mouth 4 (four) times daily as needed.  30 capsule  0  . lidocaine (LIDODERM) 5 % Place 1 patch onto the skin daily. Remove & Discard patch within 12 hours or as  directed by MD       . lidocaine (XYLOCAINE) 5 % ointment Apply topically as needed.  35.44 g  3  . rizatriptan (MAXALT) 10 MG tablet Take 1 tablet (10 mg total) by mouth as needed. May repeat in 2 hours if needed  10 tablet  0  . [DISCONTINUED] budesonide-formoterol (SYMBICORT) 160-4.5 MCG/ACT inhaler Inhale 2 puffs into the lungs 2 (two) times daily.  1 Inhaler  1  . [DISCONTINUED] VIVELLE-DOT 0.1 MG/24HR PLACE 1 PATCH ONTO THE SKIN TWICE WEEKLY  8 each  5  . [DISCONTINUED] cefUROXime (CEFTIN) 500 MG tablet Take 1 tablet (500 mg total) by mouth 2 (two) times daily.  14 tablet  0   No facility-administered encounter medications on file as of 08/20/2012.    EXAM:  BP 118/78  Pulse 58  Temp(Src) 97.9 F (36.6 C) (Oral)  Wt 146 lb (66.225 kg)  BMI 25.05 kg/m2  SpO2 98%  Body mass index is 25.05 kg/(m^2).  GENERAL: vitals reviewed and listed above, alert, oriented, appears well hydrated and in no acute distress looks pretty well today  HEENT: atraumatic, conjunctiva  clear, no obvious abnormalities on  inspection of external nose and ears OP : no lesion edema or exudate   NECK: no obvious masses on inspection palpation tiny pebble-sized left cyst versus a.c. node nontender mobile  LUNGS: clear to auscultation bilaterally, no wheezes, rales or rhonchi, good air movement  CV: HRRR, no clubbing cyanosis or  peripheral edema nl cap refill   MS: moves all extremities without noticeable focal  abnormality  PSYCH: pleasant and cooperative, no obvious depression or anxiety  ASSESSMENT AND PLAN:  Discussed the following assessment and plan:  Bronchospasm - Much improved on steroid inhaler and removal from old the house. At this time hold on specialty referral consider PFTs in the future maintain on Symbicort for n  SLEEPLESSNESS  ALLERGIC RHINITIS  Exposure to mold  bp much better now  -Patient advised to return or notify health care team  if symptoms worsen or persist or new concerns arise.  Patient Instructions  Stay on the  Inhaler  symbicort for now .   Can decrease   To one pufff if doing well .  Will hold off on referral as this seem to be reacting .    OV in MAY.    Neta Mends. Jaquetta Currier M.D.

## 2012-08-20 NOTE — Patient Instructions (Addendum)
Stay on the  Inhaler  symbicort for now .   Can decrease   To one pufff if doing well .  Will hold off on referral as this seem to be reacting .    OV in MAY.

## 2012-09-20 ENCOUNTER — Other Ambulatory Visit: Payer: Self-pay | Admitting: Internal Medicine

## 2012-09-22 NOTE — Telephone Encounter (Signed)
Last filled on 08/13/12 #60 with 0 additional refills. Last seen acutely on 08/20/12 Follow up on 09/23/12 Please advise. Thanks!!

## 2012-09-23 ENCOUNTER — Ambulatory Visit (INDEPENDENT_AMBULATORY_CARE_PROVIDER_SITE_OTHER): Payer: PRIVATE HEALTH INSURANCE | Admitting: Internal Medicine

## 2012-09-23 ENCOUNTER — Encounter: Payer: Self-pay | Admitting: Internal Medicine

## 2012-09-23 VITALS — BP 130/84 | HR 57 | Temp 98.2°F | Wt 148.0 lb

## 2012-09-23 DIAGNOSIS — R030 Elevated blood-pressure reading, without diagnosis of hypertension: Secondary | ICD-10-CM

## 2012-09-23 DIAGNOSIS — J309 Allergic rhinitis, unspecified: Secondary | ICD-10-CM

## 2012-09-23 DIAGNOSIS — J45909 Unspecified asthma, uncomplicated: Secondary | ICD-10-CM

## 2012-09-23 DIAGNOSIS — G4700 Insomnia, unspecified: Secondary | ICD-10-CM

## 2012-09-23 MED ORDER — ALPRAZOLAM 0.5 MG PO TABS: ORAL_TABLET | ORAL | Status: DC

## 2012-09-23 NOTE — Progress Notes (Signed)
Chief Complaint  Patient presents with  . Follow-up    HPI: Better than before ocass  Neb  To go to  [employer] in July       Graduated  Doctorate  Last week   . Still seeing   counselor  Monthly.  Doing well.     Doing so much better respiratory still uses twice a day Symbicort and notices a need for it but rarely uses the nebulizer. She still has to go into the moldy house occasionally has some mild upper respiratory flares.  Sleep doing well Xanax  1.5 at night  and rarely during the day doing well on this no longer on the Ambien plans on continuing with the counselor monthly. Doesn't get the next day hangover not really using it during the day anymore. She was originally put on this for performance anxiety and hardly ever used it. She is now using it to help with sleep and anxiety at night but is now off the Ambien. Has a wellness visit scheduled for July.  ROS: See pertinent positives and negatives per HPI. No chest pain shortness of breath but sometimes feels heavy deep in her lungs at the bases.  Past Medical History  Diagnosis Date  . Migraines   . Bladder polyps     and stones dr Logan Bores   . Headache   . 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 Allergic rhinitis   . MIGRAINE HEADACHE 03/29/2009    Qualifier: Diagnosis of  By: Fabian Sharp MD, Neta Mends     No family history on file.  History   Social History  . Marital Status: Married    Spouse Name: N/A    Number of Children: N/A  . Years of Education: N/A   Social History Main Topics  . Smoking status: Never Smoker   . Smokeless tobacco: None  . Alcohol Use: No  . Drug Use: No  . Sexually Active:    Other Topics Concern  . None   Social History Narrative   Occupation: [employer]  going to [employer]. To finishing   Married   Regular exercise- no some   Has grandchildren                Outpatient Encounter Prescriptions  as of 09/23/2012  Medication Sig Dispense Refill  . atenolol (TENORMIN) 25 MG tablet Whole tab in the morning and half tab in the evening  45 tablet  5  . budesonide-formoterol (SYMBICORT) 160-4.5 MCG/ACT inhaler Inhale 2 puffs into the lungs 2 (two) times daily.  1 Inhaler  3  . cyclobenzaprine (FLEXERIL) 10 MG tablet Take 10 mg by mouth 3 (three) times daily as needed.        Marland Kitchen estradiol (VIVELLE-DOT) 0.1 MG/24HR PLACE 1 PATCH ONTO THE SKIN TWICE WEEKLY  24 patch  3  . HYDROcodone-acetaminophen (NORCO) 5-325 MG per tablet Take 1 tablet by mouth every 6 (six) hours as needed.      Marland Kitchen ibuprofen (ADVIL,MOTRIN) 800 MG tablet TAKE ONE TABLET BY MOUTH TWICE A DAY AS NEEDED FOR PAIN  60 tablet  1  . ketoprofen (ORUDIS) 50 MG capsule Take 1 capsule (50 mg total) by mouth 4 (four) times daily as needed.  30 capsule  0  . lidocaine (LIDODERM) 5 % Place 1 patch onto the skin daily. Remove & Discard patch within 12 hours or as directed  by MD       . lidocaine (XYLOCAINE) 5 % ointment Apply topically as needed.  35.44 g  3  . rizatriptan (MAXALT) 10 MG tablet Take 1 tablet (10 mg total) by mouth as needed. May repeat in 2 hours if needed  10 tablet  0  . [DISCONTINUED] ALPRAZolam (NIRAVAM) 0.5 MG dissolvable tablet Take 1 tablet (0.5 mg total) by mouth 3 (three) times daily as needed for anxiety.  60 tablet  0  . ALPRAZolam (XANAX) 0.5 MG tablet 1.5 hs and bid prn. Anxiety  60 tablet  2   No facility-administered encounter medications on file as of 09/23/2012.    EXAM:  BP 130/84  Pulse 57  Temp(Src) 98.2 F (36.8 C) (Oral)  Wt 148 lb (67.132 kg)  BMI 25.39 kg/m2  SpO2 98%  Body mass index is 25.39 kg/(m^2).  GENERAL: vitals reviewed and listed above, alert, oriented, appears well hydrated and in no acute distress  HEENT: atraumatic, conjunctiva  clear, no obvious abnormalities on inspection of external nose and ears O MINIMAL CONGESTION NECK: no obvious masses on inspection palpation   LUNGS:  clear to auscultation bilaterally, no wheezes, rales or rhonchi, good air movement  CV: HRRR, no clubbing cyanosis or  peripheral edema nl cap refill   MS: moves all extremities without noticeable focal  abnormality  PSYCH: pleasant and cooperative, no obvious depression or anxiety  ASSESSMENT AND PLAN:  Discussed the following assessment and plan:  Asthmatic bronchitis -  so much better continue controller medication some exposure eventually may get pulmonary evaluation  ALLERGIC RHINITIS  Elevated blood pressure reading  SLEEPLESSNESS - On 1.5 of the 0.5 Xanax at night and off of the Ambien doing well at present we'll continue long-term risk benefit discussed. Today she is doing so much better in all fronts. Maintain same medications for now we'll reassess in July when she comes back in contact us for refills. Continue her monthly counseling decision about further pulmonary evaluation at that time. -Patient advised to return or notify health care team  if symptoms worsen or persist or new concerns arise.  Patient Instructions  Stay on same meds  For now. Will discuss possible pulmorary evaluation  At your check up.      Neta Mends. Panosh M.D.

## 2012-09-23 NOTE — Patient Instructions (Signed)
Stay on same meds  For now. Will discuss possible pulmorary evaluation  At your check up.

## 2012-11-18 ENCOUNTER — Other Ambulatory Visit (INDEPENDENT_AMBULATORY_CARE_PROVIDER_SITE_OTHER): Payer: PRIVATE HEALTH INSURANCE

## 2012-11-18 ENCOUNTER — Other Ambulatory Visit: Payer: Self-pay | Admitting: Internal Medicine

## 2012-11-18 DIAGNOSIS — Z0000 Encounter for general adult medical examination without abnormal findings: Secondary | ICD-10-CM

## 2012-11-18 LAB — CBC WITH DIFFERENTIAL/PLATELET
Basophils Relative: 0.6 % (ref 0.0–3.0)
Eosinophils Absolute: 0.1 10*3/uL (ref 0.0–0.7)
Eosinophils Relative: 2.8 % (ref 0.0–5.0)
Hemoglobin: 12.6 g/dL (ref 12.0–15.0)
MCHC: 34.2 g/dL (ref 30.0–36.0)
MCV: 88.4 fl (ref 78.0–100.0)
Monocytes Absolute: 0.3 10*3/uL (ref 0.1–1.0)
Neutro Abs: 2.8 10*3/uL (ref 1.4–7.7)
RBC: 4.18 Mil/uL (ref 3.87–5.11)

## 2012-11-18 LAB — BASIC METABOLIC PANEL
CO2: 30 mEq/L (ref 19–32)
Chloride: 108 mEq/L (ref 96–112)
Potassium: 4.4 mEq/L (ref 3.5–5.1)
Sodium: 139 mEq/L (ref 135–145)

## 2012-11-18 LAB — TSH: TSH: 2.34 u[IU]/mL (ref 0.35–5.50)

## 2012-11-18 LAB — LIPID PANEL
Cholesterol: 149 mg/dL (ref 0–200)
HDL: 47.5 mg/dL (ref >39.00)
LDL Cholesterol: 94 mg/dL (ref 0–99)
VLDL: 8 mg/dL (ref 0.0–40.0)

## 2012-11-18 LAB — HEPATIC FUNCTION PANEL
ALT: 19 U/L (ref 0–35)
Albumin: 3.8 g/dL (ref 3.5–5.2)
Alkaline Phosphatase: 51 U/L (ref 39–117)
Total Protein: 6.3 g/dL (ref 6.0–8.3)

## 2012-11-19 NOTE — Telephone Encounter (Signed)
Last filled on 07/23/12 #60 with 1 additional refills Last seen on 09/23/12 Has a follow up appt on 11/26/12. Please advise.  Thanks!

## 2012-11-19 NOTE — Telephone Encounter (Signed)
Ok x  2 refills 

## 2012-11-26 ENCOUNTER — Ambulatory Visit (INDEPENDENT_AMBULATORY_CARE_PROVIDER_SITE_OTHER): Payer: PRIVATE HEALTH INSURANCE | Admitting: Internal Medicine

## 2012-11-26 ENCOUNTER — Encounter: Payer: Self-pay | Admitting: Internal Medicine

## 2012-11-26 VITALS — BP 140/88 | HR 55 | Temp 97.7°F | Ht 64.0 in | Wt 150.0 lb

## 2012-11-26 DIAGNOSIS — Z0000 Encounter for general adult medical examination without abnormal findings: Secondary | ICD-10-CM

## 2012-11-26 DIAGNOSIS — Z79890 Hormone replacement therapy (postmenopausal): Secondary | ICD-10-CM

## 2012-11-26 DIAGNOSIS — G43909 Migraine, unspecified, not intractable, without status migrainosus: Secondary | ICD-10-CM

## 2012-11-26 DIAGNOSIS — N949 Unspecified condition associated with female genital organs and menstrual cycle: Secondary | ICD-10-CM

## 2012-11-26 DIAGNOSIS — N951 Menopausal and female climacteric states: Secondary | ICD-10-CM

## 2012-11-26 DIAGNOSIS — R102 Pelvic and perineal pain: Secondary | ICD-10-CM

## 2012-11-26 DIAGNOSIS — R350 Frequency of micturition: Secondary | ICD-10-CM

## 2012-11-26 DIAGNOSIS — G4700 Insomnia, unspecified: Secondary | ICD-10-CM

## 2012-11-26 DIAGNOSIS — R030 Elevated blood-pressure reading, without diagnosis of hypertension: Secondary | ICD-10-CM

## 2012-11-26 LAB — POCT URINALYSIS DIP (MANUAL ENTRY)
Ketones, POC UA: NEGATIVE
Protein Ur, POC: NEGATIVE
Spec Grav, UA: 1.01
Urobilinogen, UA: 0.2
pH, UA: 7

## 2012-11-26 MED ORDER — ATENOLOL 25 MG PO TABS: ORAL_TABLET | ORAL | Status: DC

## 2012-11-26 MED ORDER — IBUPROFEN 800 MG PO TABS: ORAL_TABLET | ORAL | Status: DC

## 2012-11-26 NOTE — Patient Instructions (Signed)
Continue lifestyle intervention healthy eating and exercise . Increase atenolol to 1 twice a day  bp is borderline . No other med changes Get a screening [facility]ogram [facility] end of summer to do referral for  Colonoscopy in fall.  Glad you are doing well!!

## 2012-11-26 NOTE — Progress Notes (Signed)
Chief Complaint  Patient presents with  . Annual Exam    HPI: Patient comes in today for Preventive Health Care visit   Hip ; Not too bad    Trying to walk on reg basis  BP ;up  Not taking  Readings  Was taking 1/2 at and 1 in am   Can increased.  vivelle dot patch ;  .1    Patches  Hot flush 2 x per week. When needs to change patch but doing well.  Resp: [place] in [place]  .  Out for 2 months  And breathing .     Ok .  Newer house doing better  Trying to walk.   Headaches ;rescue   Once in 60 per day.   Atenolol.  Also  Helps so much   Also pain in  hip.  ocas ketoprofen for ha Sleep and anxiety : Xanax at night and when panics   Rarely.    90 days.   Instead of ambien    ROS:  GEN/ HEENT: No fever, significant weight changes sweats headaches vision problems hearing changes, CV/ PULM; No chest pain shortness of breath cough, syncope,edema  change in exercise tolerance. GI /GU: No adominal pain, vomiting, change in bowel habits. No blood in the stool. No significant GU symptoms. SKIN/HEME: ,no acute skin rashes suspicious lesions or bleeding. No lymphadenopathy, nodules, masses.  NEURO/ PSYCH:  No neurologic signs such as weakness numbness. No depression anxiety. IMM/ Allergy: No unusual infections.  Allergy .  Better in new environs  REST of 12 system review negative except as per HPI   Past Medical History  Diagnosis Date  . Migraines   . Bladder polyps     and stones dr Logan Bores   . Headache(784.0)   . 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 Allergic rhinitis   . MIGRAINE HEADACHE 03/29/2009    Qualifier: Diagnosis of  By: Fabian Sharp MD, Neta Mends   . Bronchospasm 08/20/2012    Much improved on steroid inhaler and removal from old the house. At this time hold on specialty referral consider PFTs in the future maintain on Symbicort for n   . Reaction to severe stress 12/31/2011  . Drug rash 05/13/2011    ? Fixed rash   From diflucan  ?     No family history on file.  History   Social History  . Marital Status: Married    Spouse Name: N/A    Number of Children: N/A  . Years of Education: N/A   Social History Main Topics  . Smoking status: Never Smoker   . Smokeless tobacco: None  . Alcohol Use: No  . Drug Use: No  . Sexually Active:    Other Topics Concern  . None   Social History Narrative   Occupation: [employer]  going to [employer]. To finishing   Married   Regular exercise- no some   Has grandchildren                Outpatient Encounter Prescriptions as of 11/26/2012  Medication Sig Dispense Refill  . ALPRAZolam (XANAX) 0.5 MG tablet 1.5 hs and bid prn. Anxiety  60 tablet  2  . atenolol (TENORMIN) 25 MG tablet One po bid  60 tablet  11  . cyclobenzaprine (FLEXERIL) 10 MG tablet Take 10 mg by mouth 3 (three) times daily as needed.        Marland Kitchen  estradiol (VIVELLE-DOT) 0.1 MG/24HR PLACE 1 PATCH ONTO THE SKIN TWICE WEEKLY  24 patch  3  . ibuprofen (ADVIL,MOTRIN) 800 MG tablet TAKE 1 TABLET BY MOUTH TWICE DAILY AS NEEDED FOR PAIN  60 tablet  3  . ketoprofen (ORUDIS) 50 MG capsule Take 1 capsule (50 mg total) by mouth 4 (four) times daily as needed.  30 capsule  0  . lidocaine (LIDODERM) 5 % Place 1 patch onto the skin daily. Remove & Discard patch within 12 hours or as directed by MD       . lidocaine (XYLOCAINE) 5 % ointment Apply topically as needed.  35.44 g  3  . rizatriptan (MAXALT) 10 MG tablet Take 1 tablet (10 mg total) by mouth as needed. May repeat in 2 hours if needed  10 tablet  0  . [DISCONTINUED] atenolol (TENORMIN) 25 MG tablet Whole tab in the morning and half tab in the evening  45 tablet  5  . [DISCONTINUED] HYDROcodone-acetaminophen (NORCO) 5-325 MG per tablet Take 1 tablet by mouth every 6 (six) hours as needed.      . [DISCONTINUED] ibuprofen (ADVIL,MOTRIN) 800 MG tablet TAKE 1 TABLET BY MOUTH TWICE DAILY AS NEEDED FOR PAIN  60 tablet  1  .  [DISCONTINUED] budesonide-formoterol (SYMBICORT) 160-4.5 MCG/ACT inhaler Inhale 2 puffs into the lungs 2 (two) times daily.  1 Inhaler  3   No facility-administered encounter medications on file as of 11/26/2012.    EXAM:  BP 140/88  Pulse 55  Temp(Src) 97.7 F (36.5 C) (Oral)  Ht 5\' 4"  (1.626 m)  Wt 150 lb (68.04 kg)  BMI 25.73 kg/m2  SpO2 98%  Body mass index is 25.73 kg/(m^2).  Physical Exam: Vital signs reviewed RUE:AVWU is a well-developed well-nourished alert cooperative   female who appears her stated age in no acute distress.  HEENT: normocephalic atraumatic , Eyes: PERRL EOM's full, conjunctiva clear, Nares: paten,t no deformity discharge or tenderness., Ears: no deformity EAC's clear TMs with normal landmarks. Mouth: clear OP, no lesions, edema.  Moist mucous membranes. Dentition in adequate repair. NECK: supple without masses, thyromegaly or bruits. CHEST/PULM:  Clear to auscultation and percussion breath sounds equal no wheeze , rales or rhonchi. No chest wall deformities or tenderness. CV: PMI is nondisplaced, S1 S2 no gallops, murmurs, rubs. Peripheral pulses are full without delay.No JVD . Breast: normal by inspection . No dimpling, discharge, masses, tenderness or discharge . ABDOMEN: Bowel sounds normal nontender  No guard or rebound, no hepato splenomegal no CVA tenderness.  No hernia. Extremtities:  No clubbing cyanosis or edema, no acute joint swelling or redness no focal atrophy tender left groin area   NEURO:  Oriented x3, cranial nerves 3-12 appear to be intact, no obvious focal weakness,gait within normal limits no abnormal reflexes or asymmetrical SKIN: No acute rashes normal turgor, color, no bruising or petechiae. PSYCH: Oriented, good eye contact, no obvious depression anxiety, cognition and judgment appear normal. LN: no cervical axillary inguinal adenopathy  Lab Results  Component Value Date   WBC 4.8 11/18/2012   HGB 12.6 11/18/2012   HCT 36.9 11/18/2012    PLT 205.0 11/18/2012   GLUCOSE 98 11/18/2012   CHOL 149 11/18/2012   TRIG 40.0 11/18/2012   HDL 47.50 11/18/2012   LDLCALC 94 11/18/2012   ALT 19 11/18/2012   AST 21 11/18/2012   NA 139 11/18/2012   K 4.4 11/18/2012   CL 108 11/18/2012   CREATININE 0.7 11/18/2012   BUN 15 11/18/2012  CO2 30 11/18/2012   TSH 2.34 11/18/2012    ASSESSMENT AND PLAN:  Discussed the following assessment and plan:  Visit for preventive health examination - colonsocpy and mammo needed disc  - Plan: POCT urinalysis dipstick  Urinary frequency - Plan: POCT urinalysis dipstick  MENOPAUSE-RELATED VASOMOTOR SYMPTOMS  Postmenopausal HRT (hormone replacement therapy) - continue few break through sx   MIGRAINE HEADACHE - bette control on meds now   SLEEPLESSNESS - ok to continu xanax   Female pelvic pain post surgery  - stable   Elevated blood pressure reading - better on atenolol ibu could aggravete   Patient Care Team: Madelin Headings, MD as PCP - General Patient Instructions  Continue lifestyle intervention healthy eating and exercise . Increase atenolol to 1 twice a day  bp is borderline . No other med changes Get a screening [facility]ogram [facility] end of summer to do referral for  Colonoscopy in fall.  Glad you are doing well!!    Burna Mortimer K. Panosh M.D.

## 2012-12-31 ENCOUNTER — Ambulatory Visit (INDEPENDENT_AMBULATORY_CARE_PROVIDER_SITE_OTHER): Payer: PRIVATE HEALTH INSURANCE | Admitting: Internal Medicine

## 2012-12-31 ENCOUNTER — Encounter: Payer: Self-pay | Admitting: Internal Medicine

## 2012-12-31 VITALS — BP 136/72 | HR 62 | Temp 97.7°F | Wt 151.0 lb

## 2012-12-31 DIAGNOSIS — Z79890 Hormone replacement therapy (postmenopausal): Secondary | ICD-10-CM

## 2012-12-31 DIAGNOSIS — J310 Chronic rhinitis: Secondary | ICD-10-CM

## 2012-12-31 DIAGNOSIS — G4700 Insomnia, unspecified: Secondary | ICD-10-CM

## 2012-12-31 DIAGNOSIS — R490 Dysphonia: Secondary | ICD-10-CM

## 2012-12-31 MED ORDER — ESTRADIOL 0.1 MG/24HR TD PTTW: MEDICATED_PATCH | TRANSDERMAL | Status: DC

## 2012-12-31 MED ORDER — FLUTICASONE PROPIONATE 50 MCG/ACT NA SUSP: NASAL | Status: DC

## 2012-12-31 MED ORDER — ALPRAZOLAM 0.5 MG PO TABS: ORAL_TABLET | ORAL | Status: DC

## 2012-12-31 NOTE — Progress Notes (Signed)
Chief Complaint  Patient presents with  . Hoarse    HPI: Patient comes in today for SDA for   problem evaluation. 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 She is having continued  Clear nose and cough     a lot better than before has stopped the inhaler in case that made it ongoing .  Although her chest is much better  since she's been on the moldy house  No sig help with antihistamines She gets occasional heartburn but doesn't have a reflux history. She would like a refill on her Xanax   1.5  Hs and 1/2 and sun am.  Or events. Such as funerals or other. She needs a refill on her Vivelle-Dot.   ROS: See pertinent positives and negatives per HPI. No current chest pain shortness of breath or active wheezing has never been on a nasal steroid but has used over-the-counter nose sprays. Past Medical History  Diagnosis Date  . Migraines   . Bladder polyps     and stones dr Logan Bores   . Headache(784.0)   . 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 Allergic rhinitis   . MIGRAINE HEADACHE 03/29/2009    Qualifier: Diagnosis of  By: Fabian Sharp MD, Neta Mends   . Bronchospasm 08/20/2012    Much improved on steroid inhaler and removal from old the house. At this time hold on specialty referral consider PFTs in the future maintain on Symbicort for n   . Reaction to severe stress 12/31/2011  . Drug rash 05/13/2011    ? Fixed rash  From diflucan  ?   Marland Kitchen Exposure to mold 07/30/2012  . Asthmatic bronchitis 07/30/2012   fam hx cad has and cvas No family history on file.  History   Social History  . Marital Status: Married    Spouse Name: N/A    Number of Children: N/A  . Years of Education: N/A   Social History Main Topics  . Smoking status: Never Smoker   . Smokeless tobacco: None  . Alcohol Use: No  . Drug Use: No  . Sexual  Activity:    Other Topics Concern  . None   Social History Narrative   Occupation: [employer]  going to [employer]. To finishing   Married   Regular exercise- no some   Has grandchildren                Outpatient Encounter Prescriptions as of 12/31/2012  Medication Sig Dispense Refill  . ALPRAZolam (XANAX) 0.5 MG tablet 1.5 hs and bid prn. Anxiety  60 tablet  2  . atenolol (TENORMIN) 25 MG tablet One po bid  60 tablet  11  . cyclobenzaprine (FLEXERIL) 10 MG tablet Take 10 mg by mouth 3 (three) times daily as needed.        Marland Kitchen estradiol (VIVELLE-DOT) 0.1 MG/24HR patch PLACE 1 PATCH ONTO THE SKIN TWICE WEEKLY  24 patch  3  . ibuprofen (ADVIL,MOTRIN) 800 MG tablet TAKE 1 TABLET BY MOUTH TWICE DAILY AS NEEDED FOR PAIN  60 tablet  3  . ketoprofen (ORUDIS) 50 MG capsule Take 1 capsule (50 mg total) by mouth 4 (four) times daily as needed.  30 capsule  0  . lidocaine (LIDODERM) 5 % Place  1 patch onto the skin daily. Remove & Discard patch within 12 hours or as directed by MD       . lidocaine (XYLOCAINE) 5 % ointment Apply topically as needed.  35.44 g  3  . rizatriptan (MAXALT) 10 MG tablet Take 1 tablet (10 mg total) by mouth as needed. May repeat in 2 hours if needed  10 tablet  0  . [DISCONTINUED] ALPRAZolam (XANAX) 0.5 MG tablet 1.5 hs and bid prn. Anxiety  60 tablet  2  . [DISCONTINUED] estradiol (VIVELLE-DOT) 0.1 MG/24HR PLACE 1 PATCH ONTO THE SKIN TWICE WEEKLY  24 patch  3  . fluticasone (FLONASE) 50 MCG/ACT nasal spray 2 spray each nostril qd  16 g  3   No facility-administered encounter medications on file as of 12/31/2012.    EXAM:  BP 136/72  Pulse 62  Temp(Src) 97.7 F (36.5 C) (Oral)  Wt 151 lb (68.493 kg)  BMI 25.91 kg/m2  SpO2 98%  Body mass index is 25.91 kg/(m^2).  GENERAL: vitals reviewed and listed above, alert, oriented, appears well hydrated and in no acute distress mildly congested nasally with mild hoarseness to moderate  hoarseness  HEENT: atraumatic, conjunctiva  clear, no obvious abnormalities on inspection of external nose and ears no specific discharge her face pain OP : no lesion edema or exudate of erythema TMs are clear no stridor  NECK: no obvious masses on inspection palpation some mild weekend or palpable a.c. nodes negative PC  LUNGS: clear to auscultation bilaterally, no wheezes, rales or rhonchi, good air movement CV: HRRR, no clubbing cyanosis or  peripheral edema nl cap refill  MS: moves all extremities without noticeable focal  abnormality PSYCH: pleasant and cooperative,   ASSESSMENT AND PLAN:  Discussed the following assessment and plan:  Rhinitis - Still suspect some kind of allergic or reactive phenomenon.  Hoarse - Advise further evaluation ENT consider reflux postnasal drainage allergy etc. 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  Patient Instructions  Begin nasal cortisone  Every  day for possible allergy or other chronic inflammation problem. After one to 2 weeks sometimes worse this or drainage will get better. You'll be contacted about any ENT appointment to evaluate your hoarseness. It is possible it is drainage or even silent reflux.  Refill the Xanax and estrogen today  Hoarseness Hoarseness is produced from a variety of causes. It is important to find the cause so it can be treated. In the absence of a cold or upper respiratory illness, any hoarseness lasting more than 2 weeks should be looked at by a specialist. This is especially important if you have a history of smoking or alcohol use. It is also important to keep in mind that as you grow older, your voice will naturally get weaker, making it easier for you to become  hoarse from straining your vocal cords.  CAUSES  Any illness that affects your vocal cords can result in a hoarse voice. Examples of conditions that can affect the vocal cords are listed as follows:   Allergies.  Colds.  Sinusitis.  Gastroesophageal reflux disease.  Croup.  Injury.  Nodules.  Exposure to smoke or toxic fumes or gases.  Congenital and genetic defects.  Paralysis of the vocal cords.  Infections.  Advanced  age. DIAGNOSIS  In order to diagnose the cause of your hoarseness, your caregiver will examine your throat using an instrument that uses a tube with a small lighted camera (laryngoscope). It allows your caregiver to look into the mouth and down the throat. TREATMENT  For most cases, treatment will focus on the specific cause of the hoarseness. Depending on the cause, hoarseness can be a temporary condition (acute) or it can be long lasting (chronic). Most cases of hoarseness clear up without complications. Your caregiver will explain to you if this is not likely to happen. SEEK IMMEDIATE MEDICAL CARE IF:   You have increasing hoarseness or loss of voice.  You have shortness of breath.  You are coughing up blood.  There is pain in your neck or throat. Document Released: 04/11/2005 Document Revised: 07/21/2011 Document Reviewed: 07/04/2010 [facility] Patient Information 2014 [place], [place].      Neta Mends. Panosh M.D.

## 2012-12-31 NOTE — Patient Instructions (Addendum)
Begin nasal cortisone  Every  day for possible allergy or other chronic inflammation problem. After one to 2 weeks sometimes worse this or drainage will get better. You'll be contacted about any ENT appointment to evaluate your hoarseness. It is possible it is drainage or even silent reflux.  Refill the Xanax and estrogen today  Hoarseness Hoarseness is produced from a variety of causes. It is important to find the cause so it can be treated. In the absence of a cold or upper respiratory illness, any hoarseness lasting more than 2 weeks should be looked at by a specialist. This is especially important if you have a history of smoking or alcohol use. It is also important to keep in mind that as you grow older, your voice will naturally get weaker, making it easier for you to become hoarse from straining your vocal cords.  CAUSES  Any illness that affects your vocal cords can result in a hoarse voice. Examples of conditions that can affect the vocal cords are listed as follows:   Allergies.  Colds.  Sinusitis.  Gastroesophageal reflux disease.  Croup.  Injury.  Nodules.  Exposure to smoke or toxic fumes or gases.  Congenital and genetic defects.  Paralysis of the vocal cords.  Infections.  Advanced age. DIAGNOSIS  In order to diagnose the cause of your hoarseness, your caregiver will examine your throat using an instrument that uses a tube with a small lighted camera (laryngoscope). It allows your caregiver to look into the mouth and down the throat. TREATMENT  For most cases, treatment will focus on the specific cause of the hoarseness. Depending on the cause, hoarseness can be a temporary condition (acute) or it can be long lasting (chronic). Most cases of hoarseness clear up without complications. Your caregiver will explain to you if this is not likely to happen. SEEK IMMEDIATE MEDICAL CARE IF:   You have increasing hoarseness or loss of voice.  You have shortness of  breath.  You are coughing up blood.  There is pain in your neck or throat. Document Released: 04/11/2005 Document Revised: 07/21/2011 Document Reviewed: 07/04/2010 [facility] Patient Information 2014 [place], [place].

## 2013-01-18 ENCOUNTER — Telehealth: Payer: Self-pay | Admitting: Family Medicine

## 2013-01-18 ENCOUNTER — Other Ambulatory Visit: Payer: Self-pay | Admitting: Family Medicine

## 2013-01-18 MED ORDER — CEFUROXIME AXETIL 500 MG PO TABS: 500.0000 mg | ORAL_TABLET | Freq: Two times a day (BID) | ORAL | Status: DC

## 2013-01-18 NOTE — Telephone Encounter (Signed)
Patient last seen on 12/31/12 for rhinitis.  She called today to report that she has gotten worse.  Has sinus pressure, ear pain, teeth pain, post nasal drip, fever and a nasal discharge that is now yellow/orange.  She would like something sent to the pharmacy.  Please advise.  Thanks!!

## 2013-01-18 NOTE — Telephone Encounter (Signed)
?   augmentin  If she has a gi se  Please put this in the adverse  Medication list  Otherwise ceftin 500 bid for 10 day .  Or doxycycline 100 mg 1 po bid for 10 days

## 2013-01-18 NOTE — Telephone Encounter (Signed)
Spoke to the pt.  She could not recall ever being on augmentin.  Wanted ceftin called to the pharmacy.  Sent to [facility] in [place].

## 2013-02-11 LAB — HM MAMMOGRAPHY

## 2013-02-16 ENCOUNTER — Encounter: Payer: Self-pay | Admitting: Internal Medicine

## 2013-02-24 ENCOUNTER — Ambulatory Visit (INDEPENDENT_AMBULATORY_CARE_PROVIDER_SITE_OTHER): Payer: PRIVATE HEALTH INSURANCE | Admitting: Internal Medicine

## 2013-02-24 ENCOUNTER — Encounter: Payer: Self-pay | Admitting: Internal Medicine

## 2013-02-24 ENCOUNTER — Ambulatory Visit (HOSPITAL_COMMUNITY)
Admission: RE | Admit: 2013-02-24 | Discharge: 2013-02-24 | Disposition: A | Discharge status: Home / Self Care | Payer: PRIVATE HEALTH INSURANCE | Source: Ambulatory Visit | Attending: Internal Medicine | Admitting: Internal Medicine

## 2013-02-24 VITALS — BP 124/78 | HR 59 | Temp 97.9°F | Wt 148.0 lb

## 2013-02-24 DIAGNOSIS — Z23 Encounter for immunization: Secondary | ICD-10-CM

## 2013-02-24 DIAGNOSIS — R52 Pain, unspecified: Secondary | ICD-10-CM

## 2013-02-24 DIAGNOSIS — R1013 Epigastric pain: Secondary | ICD-10-CM | POA: Insufficient documentation

## 2013-02-24 DIAGNOSIS — R110 Nausea: Secondary | ICD-10-CM

## 2013-02-24 NOTE — Progress Notes (Signed)
Chief Complaint  Patient presents with  . Abdominal Pain    Started on Sunday after she ate lunch.  Pain has diminished since.  Has had a lot of burping and gas.  . Back Pain    HPI: Patient comes in today for SDA for  new problem evaluation. After fried fish and shrimp and within 15 mintues and then weak and nauseated .   seriois pain mid epigastric  Radiated to back   Last 4- 5 hours.  5 tums didnt helped  Now just nausea .  Pain lightly there  At this time  Like full. Bile tast. In mouth at times  Hx for intermittent minor things in the past  .    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  ROS: See pertinent positives and negatives per HPI. No fever v d new rash    Past Medical History  Diagnosis Date  . Migraines   . Bladder polyps     and stones dr Logan Bores   . Headache(784.0)   . 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 Allergic rhinitis   . MIGRAINE HEADACHE 03/29/2009    Qualifier: Diagnosis of  By: Fabian Sharp MD, Neta Mends   . Bronchospasm 08/20/2012    Much improved on steroid inhaler and removal from old the house. At this time hold on specialty referral consider PFTs in the future maintain on Symbicort for n   . Reaction to severe stress 12/31/2011  . Drug rash 05/13/2011    ? Fixed rash  From diflucan  ?   Marland Kitchen Exposure to mold 07/30/2012  . Asthmatic bronchitis 07/30/2012    Family History  Problem Relation Age of Onset  . Gallstones Mother   . Gallstones Sister     History   Social History  . Marital Status: Married    Spouse Name: N/A    Number of Children: N/A  . Years of Education: N/A   Social History Main Topics  . Smoking status: Never Smoker   . Smokeless tobacco: None  . Alcohol Use: No  . Drug Use: No  . Sexual Activity:    Other Topics Concern  . None   Social History Narrative   Occupation: [employer]  going to [employer]. To finishing   Married   Regular  exercise- no some   Has grandchildren                Outpatient Encounter Prescriptions as of 02/24/2013  Medication Sig Dispense Refill  . ALPRAZolam (XANAX) 0.5 MG tablet 1.5 hs and bid prn. Anxiety  60 tablet  2  . atenolol (TENORMIN) 25 MG tablet One po bid  60 tablet  11  . cyclobenzaprine (FLEXERIL) 10 MG tablet Take 10 mg by mouth 3 (three) times daily as needed.        Marland Kitchen estradiol (VIVELLE-DOT) 0.1 MG/24HR patch PLACE 1 PATCH ONTO THE SKIN TWICE WEEKLY  24 patch  3  . ibuprofen (ADVIL,MOTRIN) 800 MG tablet TAKE 1 TABLET BY MOUTH TWICE DAILY AS NEEDED FOR PAIN  60 tablet  3  . ketoprofen (ORUDIS) 50 MG capsule Take 1 capsule (50 mg total) by mouth 4 (four) times daily as needed.  30 capsule  0  . lidocaine (LIDODERM) 5 % Place 1 patch onto the skin daily. Remove & Discard patch within 12 hours or as directed by MD       .  lidocaine (XYLOCAINE) 5 % ointment Apply topically as needed.  35.44 g  3  . rizatriptan (MAXALT) 10 MG tablet Take 1 tablet (10 mg total) by mouth as needed. May repeat in 2 hours if needed  10 tablet  0  . fluticasone (FLONASE) 50 MCG/ACT nasal spray 2 spray each nostril qd  16 g  3  . [DISCONTINUED] cefUROXime (CEFTIN) 500 MG tablet Take 1 tablet (500 mg total) by mouth 2 (two) times daily.  20 tablet  0   No facility-administered encounter medications on file as of 02/24/2013.    EXAM:  BP 124/78  Pulse 59  Temp(Src) 97.9 F (36.6 C) (Oral)  Wt 148 lb (67.132 kg)  BMI 25.39 kg/m2  SpO2 98%  Body mass index is 25.39 kg/(m^2).  GENERAL: vitals reviewed and listed above, alert, oriented, appears well hydrated and in no acute distress  HEENT: atraumatic, conjunctiva  clear, no obvious abnormalities on inspection of external nose and ears  NECK: no obvious masses on inspection palpation  LUNGS: clear to auscultation bilaterally, no wheezes, rales or rhonchi, good air movement CV: HRRR, no clubbing cyanosis or  peripheral edema nl cap refill  Abdomen:   Sof,t normal bowel sounds without hepatosplenomegaly, no guarding rebound or masses no CVA tenderness tender sore area mid epigastrium area  No ruq tenderness MS: moves all extremities without noticeable focal  abnormality PSYCH: pleasant and cooperative, no obvious depression or anxiety  ASSESSMENT AND PLAN:  Discussed the following assessment and plan:  Abdominal pain, acute, epigastric - r/o gallstones poss acid related - Plan: US Abdomen Complete  Nausea alone - Plan: US Abdomen Complete  Need for prophylactic vaccination and inoculation against influenza - Plan: Flu Vaccine QUAD 36+ mos PF IM (Fluarix) Family hx of gallstone in 2 --1 degree relative s Acid disease possible -Patient advised to return or notify health care team  if symptoms worsen or persist or new concerns arise.  Patient Instructions  This could be gall baldder disease  And or reflux acid related.   Ultrasound asap avoid fatty foods for now . Add prilosec generic otc once a day for 2 weeks and then ROV  To decoide next step IF  [place] normal .    Burna Mortimer K. Tallula Grindle M.D.

## 2013-02-24 NOTE — Patient Instructions (Signed)
This could be gall baldder disease  And or reflux acid related.   Ultrasound asap avoid fatty foods for now . Add prilosec generic otc once a day for 2 weeks and then ROV  To decoide next step IF  [place] normal .

## 2013-02-28 ENCOUNTER — Telehealth: Payer: Self-pay | Admitting: Internal Medicine

## 2013-02-28 NOTE — Telephone Encounter (Signed)
Pt would like abd ultrasound results °

## 2013-02-28 NOTE — Telephone Encounter (Signed)
Results given by telephone.

## 2013-03-14 ENCOUNTER — Encounter: Payer: Self-pay | Admitting: Internal Medicine

## 2013-03-14 ENCOUNTER — Ambulatory Visit (INDEPENDENT_AMBULATORY_CARE_PROVIDER_SITE_OTHER): Payer: PRIVATE HEALTH INSURANCE | Admitting: Internal Medicine

## 2013-03-14 VITALS — BP 146/84 | HR 74 | Temp 97.9°F | Wt 149.0 lb

## 2013-03-14 DIAGNOSIS — R52 Pain, unspecified: Secondary | ICD-10-CM

## 2013-03-14 DIAGNOSIS — R05 Cough: Secondary | ICD-10-CM

## 2013-03-14 DIAGNOSIS — R1013 Epigastric pain: Secondary | ICD-10-CM

## 2013-03-14 MED ORDER — BUDESONIDE-FORMOTEROL FUMARATE 160-4.5 MCG/ACT IN AERO: 2.0000 | INHALATION_SPRAY | Freq: Two times a day (BID) | RESPIRATORY_TRACT | Status: DC

## 2013-03-14 NOTE — Patient Instructions (Signed)
Stay on the omeprazole for a total of 4-6 weeks  And if well can take every other day and off . If sx recurr then contact us for plan.  Take the medication on days  You take ibuprofen if you can for stomach protection.  Otherwise take at least 30 minutes before reclining with enough water .  Can restart the inhaler controller  2 puffs twice a day and dec to once a day to control possible "winter allergy"

## 2013-03-14 NOTE — Progress Notes (Signed)
Chief Complaint  Patient presents with  . Follow-up    abd pain  cough    HPI:  Pt comesin for fu abd pain episodic  Much better after 4-5 days of prilosec  hasnt taken for last 2 days and noticing coming back .   Noted that she was talking ib hs if hip bothering her.   No vomiting   Had some coughing recently asks about going back on med for wheezing in case gets worse no fever mold exposure ROS: See pertinent positives and negatives per HPI.  Past Medical History  Diagnosis Date  . Migraines   . Bladder polyps     and stones dr Logan Bores   . Headache(784.0)   . 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 Allergic rhinitis   . MIGRAINE HEADACHE 03/29/2009    Qualifier: Diagnosis of  By: Fabian Sharp MD, Neta Mends   . Bronchospasm 08/20/2012    Much improved on steroid inhaler and removal from old the house. At this time hold on specialty referral consider PFTs in the future maintain on Symbicort for n   . Reaction to severe stress 12/31/2011  . Drug rash 05/13/2011    ? Fixed rash  From diflucan  ?   Marland Kitchen Exposure to mold 07/30/2012  . Asthmatic bronchitis 07/30/2012    Family History  Problem Relation Age of Onset  . Gallstones Mother   . Gallstones Sister     History   Social History  . Marital Status: Married    Spouse Name: N/A    Number of Children: N/A  . Years of Education: N/A   Social History Main Topics  . Smoking status: Never Smoker   . Smokeless tobacco: None  . Alcohol Use: No  . Drug Use: No  . Sexual Activity:    Other Topics Concern  . None   Social History Narrative   Occupation: [employer]  going to [employer]. To finishing   Married   Regular exercise- no some   Has grandchildren                Outpatient Encounter Prescriptions as of 03/14/2013  Medication Sig  . ALPRAZolam (XANAX) 0.5 MG tablet 1.5 hs and bid prn. Anxiety  . atenolol (TENORMIN) 25 MG tablet One po  bid  . cyclobenzaprine (FLEXERIL) 10 MG tablet Take 10 mg by mouth 3 (three) times daily as needed.    Marland Kitchen estradiol (VIVELLE-DOT) 0.1 MG/24HR patch PLACE 1 PATCH ONTO THE SKIN TWICE WEEKLY  . ibuprofen (ADVIL,MOTRIN) 800 MG tablet TAKE 1 TABLET BY MOUTH TWICE DAILY AS NEEDED FOR PAIN  . ketoprofen (ORUDIS) 50 MG capsule Take 1 capsule (50 mg total) by mouth 4 (four) times daily as needed.  . lidocaine (LIDODERM) 5 % Place 1 patch onto the skin daily. Remove & Discard patch within 12 hours or as directed by MD   . lidocaine (XYLOCAINE) 5 % ointment Apply topically as needed.  . rizatriptan (MAXALT) 10 MG tablet Take 1 tablet (10 mg total) by mouth as needed. May repeat in 2 hours if needed  . budesonide-formoterol (SYMBICORT) 160-4.5 MCG/ACT inhaler Inhale 2 puffs into the lungs 2 (two) times daily.  . fluticasone (FLONASE) 50 MCG/ACT nasal spray 2 spray each nostril qd    EXAM:  BP 146/84  Pulse 74  Temp(Src) 97.9 F (36.6 C) (Oral)  Wt 149 lb (67.586 kg)  SpO2 98%  Body mass index is 25.56 kg/(m^2).  GENERAL: vitals reviewed and listed above, alert, oriented, appears well hydrated and in no acute distress  HEENT: atraumatic, conjunctiva  clear, no obvious abnormalities on inspection of external nose and ears OP : no lesion edema or exudate   NECK: no obvious masses on inspection palpation  LUNGS: clear to auscultation bilaterally, no wheezes, rales or rhonchi, good air movement CV: HRRR, no clubbing cyanosis or  peripheral edema nl cap refill  Abdomen:  Sof,t normal bowel sounds without hepatosplenomegaly, no guarding rebound or masses no CVA tenderness  PSYCH: pleasant and cooperative, no obvious depression or anxiety See [place] report  ASSESSMENT AND PLAN:  Discussed the following assessment and plan:  Abdominal pain, acute, epigastric - better on ppi finishe med gi porotection with ibuprofen   Cough - hx of prob rad disease can restart symbicort for the winter   Counseled.  -Patient advised to return or notify health care team  if symptoms worsen or persist or new concerns arise.  Patient Instructions  Stay on the omeprazole for a total of 4-6 weeks  And if well can take every other day and off . If sx recurr then contact us for plan.  Take the medication on days  You take ibuprofen if you can for stomach protection.  Otherwise take at least 30 minutes before reclining with enough water .  Can restart the inhaler controller  2 puffs twice a day and dec to once a day to control possible "winter allergy"   Neta Mends. Panosh M.D.

## 2013-04-01 ENCOUNTER — Other Ambulatory Visit: Payer: Self-pay | Admitting: Internal Medicine

## 2013-04-05 NOTE — Telephone Encounter (Signed)
Last filled on 12/31/12 #60 with 2 additional refills Has no future appointment scheduled Last seen on 03/14/13 (acute) Please advise. Thanks!

## 2013-04-06 NOTE — Telephone Encounter (Signed)
Ok to refill  X   3  

## 2013-04-26 ENCOUNTER — Encounter: Payer: Self-pay | Admitting: *Deleted

## 2013-04-27 ENCOUNTER — Encounter: Payer: Self-pay | Admitting: Family

## 2013-04-27 ENCOUNTER — Ambulatory Visit (INDEPENDENT_AMBULATORY_CARE_PROVIDER_SITE_OTHER): Payer: PRIVATE HEALTH INSURANCE | Admitting: Family

## 2013-04-27 VITALS — BP 120/82 | HR 64 | Temp 98.0°F | Wt 153.0 lb

## 2013-04-27 DIAGNOSIS — I10 Essential (primary) hypertension: Secondary | ICD-10-CM

## 2013-04-27 DIAGNOSIS — J019 Acute sinusitis, unspecified: Secondary | ICD-10-CM

## 2013-04-27 MED ORDER — DOXYCYCLINE HYCLATE 100 MG PO TABS: 100.0000 mg | ORAL_TABLET | Freq: Two times a day (BID) | ORAL | Status: DC

## 2013-04-27 MED ORDER — ATENOLOL 25 MG PO TABS: ORAL_TABLET | ORAL | Status: DC

## 2013-04-27 MED ORDER — IBUPROFEN 800 MG PO TABS: ORAL_TABLET | ORAL | Status: DC

## 2013-04-27 MED ORDER — FLUTICASONE PROPIONATE 50 MCG/ACT NA SUSP: NASAL | Status: DC

## 2013-04-27 NOTE — Progress Notes (Signed)
Subjective:    Patient ID: Karen Spears, female    DOB: [birth year], 56 y.o.   MRN: [id number]  HPI  57 year old white female, nonsmoker, patient of Dr. Fabian Sharp is in today with complaints of sore throat, ear pain, nasal and chest congestion ongoing x3 days. She has not been taking her medication over-the-counter. Reports a history of sinus infections in the past and is requesting a prescription for Ceftin.  Review of Systems  Constitutional: Negative.   HENT: Positive for congestion, sinus pressure and sore throat.   Respiratory: Positive for cough. Negative for shortness of breath and wheezing.   Cardiovascular: Negative.   Musculoskeletal: Negative.   Allergic/Immunologic: Negative.   Psychiatric/Behavioral: Negative.    Past Medical History  Diagnosis Date  . Migraines   . Bladder polyps     and stones dr Logan Bores   . Headache(784.0)   . 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 Allergic rhinitis   . MIGRAINE HEADACHE 03/29/2009    Qualifier: Diagnosis of  By: Fabian Sharp MD, Neta Mends   . Bronchospasm 08/20/2012    Much improved on steroid inhaler and removal from old the house. At this time hold on specialty referral consider PFTs in the future maintain on Symbicort for n   . Reaction to severe stress 12/31/2011  . Drug rash 05/13/2011    ? Fixed rash  From diflucan  ?   Marland Kitchen Exposure to mold 07/30/2012  . Asthmatic bronchitis 07/30/2012    History   Social History  . Marital Status: Married    Spouse Name: N/A    Number of Children: N/A  . Years of Education: N/A   Occupational History  . Not on file.   Social History Main Topics  . Smoking status: Never Smoker   . Smokeless tobacco: Not on file  . Alcohol Use: No  . Drug Use: No  . Sexual Activity:    Other Topics Concern  . Not on file   Social History Narrative   Occupation: [employer]  going to [employer]. To finishing   Married    Regular exercise- no some   Has grandchildren                Past Surgical History  Procedure Laterality Date  . Abdominal hysterectomy    . Vocal cord surgery  1987    Family History  Problem Relation Age of Onset  . Gallstones Mother   . Gallstones Sister     Allergies  Allergen Reactions  . Azithromycin   . Cefdinir     REACTION: ? if headache  see Nov 9th Visit  . Fexofenadine   . Fluconazole     REACTION: Raw spot under nose and chin  . Penicillins     Current Outpatient Prescriptions on File Prior to Visit  Medication Sig Dispense Refill  . budesonide-formoterol (SYMBICORT) 160-4.5 MCG/ACT inhaler Inhale 2 puffs into the lungs 2 (two) times daily.  1 Inhaler  2  . cyclobenzaprine (FLEXERIL) 10 MG tablet Take 10 mg by mouth 3 (three) times daily as needed.        Marland Kitchen estradiol (VIVELLE-DOT) 0.1 MG/24HR patch PLACE 1 PATCH ONTO THE SKIN TWICE WEEKLY  24 patch  3  . ketoprofen (ORUDIS) 50 MG capsule Take 1 capsule (50 mg total) by mouth 4 (four) times daily as needed.  30 capsule  0  .  lidocaine (LIDODERM) 5 % Place 1 patch onto the skin daily. Remove & Discard patch within 12 hours or as directed by MD       . lidocaine (XYLOCAINE) 5 % ointment Apply topically as needed.  35.44 g  3  . rizatriptan (MAXALT) 10 MG tablet Take 1 tablet (10 mg total) by mouth as needed. May repeat in 2 hours if needed  10 tablet  0  . XANAX 0.5 MG tablet TAKE 1 1/2 TABLETS EVERY NIGHT AT BEDTIME, AND 1 TABLETS TWICE DAILY AS NEEDED.  60 tablet  2   No current facility-administered medications on file prior to visit.    BP 120/82  Pulse 64  Temp(Src) 98 F (36.7 C) (Oral)  Wt 153 lb (69.4 kg)chart     Objective:   Physical Exam  Constitutional: She is oriented to person, place, and time. She appears well-developed and well-nourished.  HENT:  Right Ear: External ear normal.  Left Ear: External ear normal.  Nose: Nose normal.  Mouth/Throat: Oropharynx is clear and moist.    Sinus tenderness to palpation  Neck: Normal range of motion. Neck supple.  Cardiovascular: Normal rate, regular rhythm and normal heart sounds.   Pulmonary/Chest: Effort normal and breath sounds normal.  Musculoskeletal: Normal range of motion.  Neurological: She is alert and oriented to person, place, and time.  Skin: Skin is warm and dry.  Psychiatric: She has a normal mood and affect.          Assessment & Plan:  Assessment: 1. Early sinusitis 2. Allergic rhinitis  Plan: Flonase 2 sprays in each once a day. Due to allergy, I did not prescribe Ceftin. Doxycycline 100 mg twice a day x10 days. Drink clear fluids. Patient called the office if symptoms worsen or persist. Recheck as scheduled, and as needed.

## 2013-04-27 NOTE — Patient Instructions (Signed)

## 2013-06-20 ENCOUNTER — Telehealth: Payer: Self-pay | Admitting: Family Medicine

## 2013-06-20 NOTE — Telephone Encounter (Signed)
The patient called and left a message on my machine.  She states that the fungus under her nails is getting much worse.  She says Dr. Regis Bill has given her medication for this in the past and she would like some called in.  She also wanted to report that she continues to take the Prilosec.

## 2013-06-21 NOTE — Telephone Encounter (Signed)
Not sure what we used   Please ask her what we used in the past.

## 2013-06-22 NOTE — Telephone Encounter (Signed)
Left message at the below listed number for the pt to return my call. 

## 2013-06-22 NOTE — Telephone Encounter (Signed)
Spoke to the pt.  She does not remember the same of the medication.  She will call her pharmacy to see if they have it on file and will call me back.

## 2013-06-22 NOTE — Telephone Encounter (Signed)
Pt called back to report that the pharmacy does not have the information.  All she remembers is that is was a pill prescribed by Dr. Sarajane Jews that she took for 30 days.

## 2013-06-22 NOTE — Telephone Encounter (Signed)
It may have been lamisil   or diflucan lamisil  250 mg 1 po qd disp 30 no refills . ROV to  rescheck but may take months to get better. Take a picture at baseline  And after treatment.

## 2013-06-23 MED ORDER — TERBINAFINE HCL 250 MG PO TABS: 250.0000 mg | ORAL_TABLET | Freq: Every day | ORAL | Status: DC

## 2013-06-23 NOTE — Telephone Encounter (Signed)
Patient notified that rx was sent to the pharmacy. She will call back to make an appointment. Notified her she will need to be seen in 30 days.

## 2013-08-03 ENCOUNTER — Encounter: Payer: Self-pay | Admitting: Internal Medicine

## 2013-08-03 ENCOUNTER — Ambulatory Visit (INDEPENDENT_AMBULATORY_CARE_PROVIDER_SITE_OTHER): Payer: PRIVATE HEALTH INSURANCE | Admitting: Internal Medicine

## 2013-08-03 VITALS — BP 148/88 | Temp 97.9°F | Ht 64.0 in | Wt 152.0 lb

## 2013-08-03 DIAGNOSIS — N949 Unspecified condition associated with female genital organs and menstrual cycle: Secondary | ICD-10-CM

## 2013-08-03 DIAGNOSIS — R102 Pelvic and perineal pain: Secondary | ICD-10-CM

## 2013-08-03 DIAGNOSIS — L989 Disorder of the skin and subcutaneous tissue, unspecified: Secondary | ICD-10-CM

## 2013-08-03 DIAGNOSIS — B351 Tinea unguium: Secondary | ICD-10-CM

## 2013-08-03 DIAGNOSIS — J019 Acute sinusitis, unspecified: Secondary | ICD-10-CM

## 2013-08-03 DIAGNOSIS — J069 Acute upper respiratory infection, unspecified: Secondary | ICD-10-CM

## 2013-08-03 MED ORDER — TERBINAFINE HCL 250 MG PO TABS: 250.0000 mg | ORAL_TABLET | Freq: Every day | ORAL | Status: DC

## 2013-08-03 MED ORDER — CEFUROXIME AXETIL 500 MG PO TABS: 500.0000 mg | ORAL_TABLET | Freq: Two times a day (BID) | ORAL | Status: DC

## 2013-08-03 MED ORDER — LIDOCAINE 5 % EX PTCH: 1.0000 | MEDICATED_PATCH | CUTANEOUS | Status: DC

## 2013-08-03 NOTE — Progress Notes (Signed)
Pre visit review using our clinic review tool, if applicable. No additional management support is needed unless otherwise documented below in the visit note.   Chief Complaint  Patient presents with  . Follow-up    Has a list toe nail fungus pain patch area on scalp early sinus infection    HPI: Patient comes in today for follow up of  multiple medical problems.   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  toenaiols and fingernails see phone note Lamisil was begun and she is here for followup although she wasn't told to take a picture as advised uncertain if it's getting any better no side effects of the medicine except it made her smell bad. She just ran out a few days ago. The problems began after she had a manicure pedicure great toes and some lifting of nails and hands  Lidoderm patch helps when she needs it needs a refill.  Not coughing now is off of the Symbicort that she needed in the past  ROS: See pertinent positives and negatives per HPI.  Past Medical History  Diagnosis Date  . Migraines   . Bladder polyps     and stones dr Amalia Hailey   . Headache(784.0)   . 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 Allergic rhinitis   . MIGRAINE HEADACHE 03/29/2009    Qualifier: Diagnosis of  By: Regis Bill MD, Standley Brooking   . Bronchospasm 08/20/2012    Much improved on steroid inhaler and removal from old the house. At this time hold on specialty referral consider PFTs in the future maintain on Symbicort for n   . Reaction to severe stress 12/31/2011  . Drug rash 05/13/2011    ? Fixed rash  From diflucan  ?   Marland Kitchen Exposure to  mold 07/30/2012  . Asthmatic bronchitis 07/30/2012    Family History  Problem Relation Age of Onset  . Gallstones Mother   . Gallstones Sister     History   Social History  . Marital Status: Married    Spouse Name: N/A    Number of Children: N/A  . Years of Education: N/A   Social History Main Topics  . Smoking status: Never Smoker   . Smokeless tobacco: Not on file  . Alcohol Use: No  . Drug Use: No  . Sexual Activity:    Other Topics Concern  . Not on file   Social History Narrative   Occupation: [employer]  going to [employer]. To finishing   Married   Regular exercise- no some   Has grandchildren                Outpatient Encounter Prescriptions as of 08/03/2013  Medication Sig  . atenolol (TENORMIN) 25 MG tablet One po bid  . estradiol (VIVELLE-DOT) 0.1 MG/24HR patch PLACE 1 PATCH ONTO THE SKIN TWICE WEEKLY  . fluticasone (FLONASE) 50 MCG/ACT nasal spray 2 spray each nostril qd  . ibuprofen (ADVIL,MOTRIN) 800 MG tablet TAKE 1 TABLET BY MOUTH TWICE DAILY AS NEEDED FOR  PAIN  . lidocaine (LIDODERM) 5 % Place 1 patch onto the skin daily. Remove & Discard patch within 12 hours or as directed by MD  . lidocaine (XYLOCAINE) 5 % ointment Apply topically as needed.  Marland Kitchen XANAX 0.5 MG tablet TAKE 1 1/2 TABLETS EVERY NIGHT AT BEDTIME, AND 1 TABLETS TWICE DAILY AS NEEDED.  . [DISCONTINUED] lidocaine (LIDODERM) 5 % Place 1 patch onto the skin daily. Remove & Discard patch within 12 hours or as directed by MD   . cefUROXime (CEFTIN) 500 MG tablet Take 1 tablet (500 mg total) by mouth 2 (two) times daily. For sinusitis  . cyclobenzaprine (FLEXERIL) 10 MG tablet Take 10 mg by mouth 3 (three) times daily as needed.    Marland Kitchen ketoprofen (ORUDIS) 50 MG capsule Take 1 capsule (50 mg total) by mouth 4 (four) times daily as needed.  . rizatriptan (MAXALT) 10 MG tablet Take 1 tablet (10 mg total) by mouth as needed. May repeat in 2 hours if needed  . terbinafine  (LAMISIL) 250 MG tablet Take 1 tablet (250 mg total) by mouth daily.  . [DISCONTINUED] budesonide-formoterol (SYMBICORT) 160-4.5 MCG/ACT inhaler Inhale 2 puffs into the lungs 2 (two) times daily.  . [DISCONTINUED] cefUROXime (CEFTIN) 500 MG tablet Take 1 tablet (500 mg total) by mouth 2 (two) times daily. For sinusitis  . [DISCONTINUED] doxycycline (VIBRA-TABS) 100 MG tablet Take 1 tablet (100 mg total) by mouth 2 (two) times daily.  . [DISCONTINUED] terbinafine (LAMISIL) 250 MG tablet Take 1 tablet (250 mg total) by mouth daily.    EXAM:  BP 148/88  Temp(Src) 97.9 F (36.6 C) (Oral)  Ht 5\' 4"  (1.626 m)  Wt 152 lb (68.947 kg)  BMI 26.08 kg/m2  Body mass index is 26.08 kg/(m^2).  GENERAL: vitals reviewed and listed above, alert, oriented, appears well hydrated and in no acute distress HEENT: atraumatic, conjunctiva  clear, no obvious abnormalities on inspection of external nose and ears mild congestion face minimally tender ears clear OP : no lesion edema or exudate  NECK: no obvious masses on inspection palpation tender a.c. area no adenopathy LUNGS: clear to auscultation bilaterally, no wheezes, rales or rhonchi, good air movement CV: HRRR, no clubbing cyanosis or  peripheral edema nl cap refill  MS: moves all extremities without noticeable focal  abnormality PSYCH: pleasant and cooperative, no obvious depression or anxiety Skin and nails scalp there is a 3 mm small verrucous-like lesion on the vertex of her scalp. No blood. Slightly irregular. Toenails with some thickening discoloration distally third great toe nails Fingernails are clear except for distal onycholysis  On most. No redness or thickening?   ASSESSMENT AND PLAN:  Discussed the following assessment and plan:  Onychomycosis prob - toenails ? if on fingernaisl or other process  only one month of rx expectant manamgnet refill x 2 months take pix  Acute URI - early sinusitis poss  continue flonase  can add antibiotic if  necessary but wiath linger   Acute sinusitis  Skin lesion - scalp  see derm  ? need for bx  Female pelvic pain - stable   patches prn. ok to refill   -Patient advised to return or notify health care team  if symptoms worsen ,persist or new concerns arise.  Patient Instructions  It may take 6-9 months to grow out toenails  Less for fingernails. Continue 2 more months of medication and take pics to assess progress.   Can see derm about scalp lesion ? Need ro removal  bx of removal looks warty .  Continue Flonase   Can add antibiotic if needed but may resolve on its own.  Pain med as needed    Standley Brooking. Panosh M.D.

## 2013-08-03 NOTE — Patient Instructions (Signed)
It may take 6-9 months to grow out toenails  Less for fingernails. Continue 2 more months of medication and take pics to assess progress.   Can see derm about scalp lesion ? Need ro removal bx of removal looks warty .  Continue Flonase   Can add antibiotic if needed but may resolve on its own.  Pain med as needed

## 2013-08-29 ENCOUNTER — Other Ambulatory Visit: Payer: Self-pay | Admitting: Internal Medicine

## 2013-08-30 NOTE — Telephone Encounter (Signed)
Ok to refill x 2  

## 2013-08-31 NOTE — Telephone Encounter (Signed)
Called and left on voicemail.

## 2013-09-29 ENCOUNTER — Other Ambulatory Visit: Payer: Self-pay | Admitting: Family

## 2013-09-29 NOTE — Telephone Encounter (Signed)
Sent to the pharmacy by e-scribe. 

## 2013-09-29 NOTE — Telephone Encounter (Signed)
Ok x 3  

## 2013-11-21 ENCOUNTER — Other Ambulatory Visit: Payer: Self-pay | Admitting: Internal Medicine

## 2013-11-22 NOTE — Telephone Encounter (Signed)
Ok x 1

## 2013-12-27 ENCOUNTER — Telehealth: Payer: Self-pay | Admitting: Family Medicine

## 2013-12-27 NOTE — Telephone Encounter (Signed)
Ok to refill x 2  Have her schedule   cpx  Before runs out in the fall.

## 2013-12-27 NOTE — Telephone Encounter (Signed)
Last filled on 11/23/13 #60 with 0 additional refills Has no future appointment scheduled Last seen on 08/03/13 Please advise. Thanks!

## 2013-12-28 ENCOUNTER — Other Ambulatory Visit: Payer: Self-pay | Admitting: Family Medicine

## 2013-12-28 DIAGNOSIS — Z0000 Encounter for general adult medical examination without abnormal findings: Secondary | ICD-10-CM

## 2013-12-28 MED ORDER — ALPRAZOLAM 0.5 MG PO TABS: ORAL_TABLET | ORAL | Status: DC

## 2013-12-28 NOTE — Telephone Encounter (Signed)
Misty pt is sch for cpx 03-22-14. Pt will need an additional refil

## 2013-12-28 NOTE — Telephone Encounter (Signed)
Called to the pharmacy and left on voicemail.  Please schedule the pt for CPE.  I have placed the lab orders in the system.  Thanks!

## 2014-02-07 ENCOUNTER — Other Ambulatory Visit: Payer: Self-pay | Admitting: Internal Medicine

## 2014-02-07 NOTE — Telephone Encounter (Signed)
Patient has upcoming CPX in Nov 2015.  Filled for 90 days.

## 2014-02-24 ENCOUNTER — Other Ambulatory Visit: Payer: Self-pay

## 2014-03-08 ENCOUNTER — Other Ambulatory Visit: Payer: Self-pay | Admitting: Internal Medicine

## 2014-03-09 NOTE — Telephone Encounter (Signed)
Refill x1 

## 2014-03-10 NOTE — Telephone Encounter (Signed)
Called to the pharmacy and left on voicemail. 

## 2014-03-15 ENCOUNTER — Other Ambulatory Visit (INDEPENDENT_AMBULATORY_CARE_PROVIDER_SITE_OTHER): Payer: PRIVATE HEALTH INSURANCE

## 2014-03-15 ENCOUNTER — Telehealth: Payer: Self-pay | Admitting: Internal Medicine

## 2014-03-15 DIAGNOSIS — Z0000 Encounter for general adult medical examination without abnormal findings: Secondary | ICD-10-CM

## 2014-03-15 LAB — CBC WITH DIFFERENTIAL/PLATELET
BASOS ABS: 0 10*3/uL (ref 0.0–0.1)
Basophils Relative: 0.6 % (ref 0.0–3.0)
EOS ABS: 0.1 10*3/uL (ref 0.0–0.7)
Eosinophils Relative: 2.1 % (ref 0.0–5.0)
HCT: 38.5 % (ref 36.0–46.0)
Hemoglobin: 12.9 g/dL (ref 12.0–15.0)
LYMPHS PCT: 27.5 % (ref 12.0–46.0)
Lymphs Abs: 1.4 10*3/uL (ref 0.7–4.0)
MCHC: 33.4 g/dL (ref 30.0–36.0)
MCV: 86.9 fl (ref 78.0–100.0)
MONOS PCT: 6.9 % (ref 3.0–12.0)
Monocytes Absolute: 0.4 10*3/uL (ref 0.1–1.0)
NEUTROS PCT: 62.9 % (ref 43.0–77.0)
Neutro Abs: 3.3 10*3/uL (ref 1.4–7.7)
PLATELETS: 220 10*3/uL (ref 150.0–400.0)
RBC: 4.43 Mil/uL (ref 3.87–5.11)
RDW: 13.2 % (ref 11.5–15.5)
WBC: 5.3 10*3/uL (ref 4.0–10.5)

## 2014-03-15 LAB — LIPID PANEL
CHOL/HDL RATIO: 4
CHOLESTEROL: 150 mg/dL (ref 0–200)
HDL: 39.3 mg/dL (ref >39.00)
LDL Cholesterol: 104 mg/dL — ABNORMAL HIGH (ref 0–99)
NonHDL: 110.7
TRIGLYCERIDES: 33 mg/dL (ref 0.0–149.0)
VLDL: 6.6 mg/dL (ref 0.0–40.0)

## 2014-03-15 LAB — BASIC METABOLIC PANEL
BUN: 18 mg/dL (ref 6–23)
CALCIUM: 8.9 mg/dL (ref 8.4–10.5)
CO2: 25 mEq/L (ref 19–32)
Chloride: 108 mEq/L (ref 96–112)
Creatinine, Ser: 0.7 mg/dL (ref 0.4–1.2)
GFR: 94.77 mL/min (ref >60.00)
GLUCOSE: 91 mg/dL (ref 70–99)
Potassium: 4 mEq/L (ref 3.5–5.1)
Sodium: 137 mEq/L (ref 135–145)

## 2014-03-15 LAB — TSH: TSH: 1.55 u[IU]/mL (ref 0.35–4.50)

## 2014-03-15 LAB — HEPATIC FUNCTION PANEL
ALK PHOS: 49 U/L (ref 39–117)
ALT: 20 U/L (ref 0–35)
AST: 22 U/L (ref 0–37)
Albumin: 3.4 g/dL — ABNORMAL LOW (ref 3.5–5.2)
BILIRUBIN TOTAL: 0.3 mg/dL (ref 0.2–1.2)
Bilirubin, Direct: 0 mg/dL (ref 0.0–0.3)
Total Protein: 6.4 g/dL (ref 6.0–8.3)

## 2014-03-15 NOTE — Telephone Encounter (Signed)
Spoke to the pharmacy and the prescription has been ready for pick up since 03/10/14.  Pt notified to pick up at the pharmacy.

## 2014-03-15 NOTE — Telephone Encounter (Signed)
Pt request refill of the following: XANAX 0.5 MG tablet  Pt said she called pharmacy and they don't have rx above rx. She will be in for labs this morning at 10am and is asking if she pick up the script     Phamacy:

## 2014-03-22 ENCOUNTER — Encounter: Payer: Self-pay | Admitting: Internal Medicine

## 2014-03-22 ENCOUNTER — Ambulatory Visit (INDEPENDENT_AMBULATORY_CARE_PROVIDER_SITE_OTHER): Payer: PRIVATE HEALTH INSURANCE | Admitting: Internal Medicine

## 2014-03-22 VITALS — BP 130/80 | Temp 98.0°F | Ht 63.25 in | Wt 142.5 lb

## 2014-03-22 DIAGNOSIS — L609 Nail disorder, unspecified: Secondary | ICD-10-CM

## 2014-03-22 DIAGNOSIS — R030 Elevated blood-pressure reading, without diagnosis of hypertension: Secondary | ICD-10-CM

## 2014-03-22 DIAGNOSIS — Z23 Encounter for immunization: Secondary | ICD-10-CM

## 2014-03-22 DIAGNOSIS — Z79899 Other long term (current) drug therapy: Secondary | ICD-10-CM

## 2014-03-22 DIAGNOSIS — Z0000 Encounter for general adult medical examination without abnormal findings: Secondary | ICD-10-CM

## 2014-03-22 NOTE — Patient Instructions (Signed)
Check bp  ocassionally  Continue lifestyle intervention healthy eating and exercise . Healthy lifestyle includes : At least 150 minutes of exercise weeks  , weight at healthy levels, which is usually   BMI 19-25. Avoid trans fats and processed foods;  Increase fresh fruits and veges to 5 servings per day. And avoid sweet beverages including tea and juice. Mediterranean diet with olive oil and nuts have been noted to be heart and brain healthy . Avoid tobacco products . Limit  alcohol to  7 per week for women and 14 servings for men.  Get adequate sleep . Wear seat belts . Don't text and drive .

## 2014-03-22 NOTE — Progress Notes (Signed)
Pre visit review using our clinic review tool, if applicable. No additional management support is needed unless otherwise documented below in the visit note.  Chief Complaint  Patient presents with  . Annual Exam    HPI: Patient comes in today for [place] visit  She has done well since her last checkup. BP 120 /80.  Easily at home when she checks it  Anxiety  Still counseling.    Acquired by her job anyway Xanax  Usually at night   Sometimes day .  Ambien used to give her headache. 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 Says nails have some whitening at the and lose hasn't used anything on them for 2-3 months. Had some self nails but no other irritation. Wonders if she should be on Lamisil  Health Maintenance  Topic Date Due  . PAP SMEAR  02/28/1975  . COLONOSCOPY  02/28/2007  . INFLUENZA VACCINE  12/11/2014  . MAMMOGRAM  02/12/2015  . TETANUS/TDAP  09/20/2018   Health Maintenance Review LIFESTYLE:  Exercise:  yes Tobacco/ETS:no Alcohol: per day no Sugar beverages:no Sleep: if takes alprax well Drug use: no Colonoscopy: utd ROS:  GEN/ HEENT: No fever, significant weight changes sweats headaches vision problems hearing changes, CV/ PULM; No chest pain shortness of breath cough, syncope,edema  change in exercise tolerance. GI /GU: No adominal pain, vomiting, change in bowel habits. No blood in the stool. No significant GU symptoms. SKIN/HEME: ,no acute skin rashes suspicious lesions or bleeding. No lymphadenopathy, nodules, masses.  NEURO/ PSYCH:  No neurologic signs such as weakness numbness. No depression anxiety.x above IMM/ Allergy: No unusual infections.  Allergy .   REST  of 12 system review negative except as per HPI   Past Medical History  Diagnosis Date  . Migraines   . Bladder polyps     and stones dr Amalia Hailey   . Headache(784.0)   . 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 Allergic rhinitis   . MIGRAINE HEADACHE 03/29/2009    Qualifier: Diagnosis of  By: Regis Bill MD, Standley Brooking   . Bronchospasm 08/20/2012    Much improved on steroid inhaler and removal from old the house. At this time hold on specialty referral consider PFTs in the future maintain on Symbicort for n   . Reaction to severe stress 12/31/2011  . Drug rash 05/13/2011    ? Fixed rash  From diflucan  ?   Marland Kitchen Exposure to mold 07/30/2012  . Asthmatic bronchitis 07/30/2012    Family History  Problem Relation Age of Onset  . Gallstones Mother   . Gallstones Sister     History   Social History  . Marital Status: Married    Spouse Name: N/A    Number of Children: N/A  . Years of Education: N/A   Social History Main Topics  . Smoking status: Never Smoker   . Smokeless tobacco: None  . Alcohol Use: No  . Drug Use: No  . Sexual Activity: None   Other Topics Concern  . 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 atenolol (TENORMIN) 25 MG tablet One po bid  . estradiol (VIVELLE-DOT) 0.1 MG/24HR patch PLACE 1 PATCH ONTO THE SKIN TWICE WEEKLY.  Marland Kitchen ibuprofen (ADVIL,MOTRIN) 800 MG tablet TAKE 1 TABLET BY MOUTH TWICE DAILY AS NEEDED FOR PAIN  . XANAX 0.5 MG tablet TAKE 1 TABLET BY MOUTH TWICE DAILY AND 1&1/2 TABLET BY MOUTH AT BEDTIME AS NEEDED  . lidocaine (LIDODERM) 5 % Place 1 patch onto the skin daily. Remove & Discard patch within 12 hours or as directed by MD  . terbinafine (LAMISIL) 250 MG tablet Take 1 tablet  (250 mg total) by mouth daily.  . [DISCONTINUED] cefUROXime (CEFTIN) 500 MG tablet Take 1 tablet (500 mg total) by mouth 2 (two) times daily. For sinusitis  . [DISCONTINUED] cyclobenzaprine (FLEXERIL) 10 MG tablet Take 10 mg by mouth 3 (three) times daily as needed.    . [DISCONTINUED] fluticasone (FLONASE) 50 MCG/ACT nasal spray 2 spray each nostril qd  . [DISCONTINUED] ketoprofen (ORUDIS) 50 MG capsule Take 1 capsule (50 mg total) by mouth 4 (four) times daily as needed.  . [DISCONTINUED] lidocaine (XYLOCAINE) 5 % ointment Apply topically as needed.  . [DISCONTINUED] rizatriptan (MAXALT) 10 MG tablet Take 1 tablet (10 mg total) by mouth as needed. May repeat in 2 hours if needed    EXAM:  BP 130/80 mmHg  Temp(Src) 98 F (36.7 C) (Oral)  Ht 5' 3.25" (1.607 m)  Wt 142 lb 8 oz (64.638 kg)  BMI 25.03 kg/m2  Body mass index is 25.03 kg/(m^2).  Physical Exam: Vital signs reviewed LYY:TKPT is a well-developed well-nourished alert cooperative    who appearsr stated age in no acute distress.  HEENT: normocephalic atraumatic , Eyes: PERRL EOM's full, conjunctiva clear, Nares: paten,t no deformity discharge or tenderness., Ears: no deformity EAC's clear TMs with normal landmarks. Mouth: clear OP, no lesions, edema.  Moist mucous membranes. Dentition in adequate repair. NECK: supple without masses, thyromegaly or bruits. CHEST/PULM:  Clear to auscultation and percussion breath sounds equal no wheeze , rales or rhonchi. No chest wall deformities or tenderness. Breast: normal by inspection . No dimpling, discharge, masses, tenderness or discharge . CV: PMI is nondisplaced, S1 S2 no gallops, murmurs, rubs. Peripheral pulses are full without delay.No JVD.  ABDOMEN: Bowel sounds normal nontender  No guard or rebound, no hepato splenomegal no CVA tenderness.  No hernia. Extremtities:  No clubbing cyanosis or edema, no acute joint swelling or redness no focal atrophy NEURO:  Oriented x3, cranial nerves  3-12 appear to be intact, no obvious focal weakness,gait within normal limits no abnormal reflexes or asymmetrical SKIN: No acute rashes normal turgor, color, no bruising or petechiae. Nails hands few withexrtreme  distal oonycholysis white  Nail beds clear  And no thickening  PSYCH: Oriented, good eye contact, no obvious depression anxiety, cognition and judgment appear normal. LN: no cervical axillary inguinal adenopathy  Lab Results  Component Value Date   WBC 5.3 03/15/2014   HGB 12.9 03/15/2014   HCT 38.5 03/15/2014   PLT 220.0 03/15/2014   GLUCOSE 91 03/15/2014   CHOL 150 03/15/2014   TRIG 33.0 03/15/2014   HDL 39.30 03/15/2014   LDLCALC 104* 03/15/2014   ALT 20 03/15/2014  AST 22 03/15/2014   NA 137 03/15/2014   K 4.0 03/15/2014   CL 108 03/15/2014   CREATININE 0.7 03/15/2014   BUN 18 03/15/2014   CO2 25 03/15/2014   TSH 1.55 03/15/2014    ASSESSMENT AND PLAN:  Discussed the following assessment and plan:  Visit for preventive health examination  Elevated blood pressure reading - better out of office and on repeat   Nail abnormalities - ? from prev nail observe pix over other months fu if needed if doesnt grow out.   Medication management - At this time benefit more than risk of the alprazolam as of the other alternatives having more side effects we'll continue at this time using caution  Need for prophylactic vaccination and inoculation against influenza - Plan: Flu Vaccine QUAD 36+ mos PF IM (Fluarix Quad PF) Anxiety using  Med before bed no sig se or rebound ok for now to continue  hdl dropped ? Change from hamburger to lean cuts  Exercise as tolerated and continue avoid processed foods. Patient Care Team: Burnis Medin, MD as PCP - General Patient Instructions  Check bp  ocassionally  Continue lifestyle intervention healthy eating and exercise . Healthy lifestyle includes : At least 150 minutes of exercise weeks  , weight at healthy levels, which is  usually   BMI 19-25. Avoid trans fats and processed foods;  Increase fresh fruits and veges to 5 servings per day. And avoid sweet beverages including tea and juice. Mediterranean diet with olive oil and nuts have been noted to be heart and brain healthy . Avoid tobacco products . Limit  alcohol to  7 per week for women and 14 servings for men.  Get adequate sleep . Wear seat belts . Don't text and drive .      Standley Brooking. Tamiya Colello M.D.

## 2014-04-12 ENCOUNTER — Other Ambulatory Visit: Payer: Self-pay | Admitting: Family

## 2014-04-12 ENCOUNTER — Other Ambulatory Visit: Payer: Self-pay | Admitting: Internal Medicine

## 2014-04-13 NOTE — Telephone Encounter (Signed)
Sent to the pharmacy by e-scribe. 

## 2014-04-13 NOTE — Telephone Encounter (Signed)
Called to the pharmacy and left on voicemail. 

## 2014-04-13 NOTE — Telephone Encounter (Signed)
Refill x 3 

## 2014-04-13 NOTE — Telephone Encounter (Signed)
Can refill x 3  

## 2014-04-24 ENCOUNTER — Other Ambulatory Visit: Payer: Self-pay | Admitting: Internal Medicine

## 2014-04-24 NOTE — Telephone Encounter (Signed)
Sent to the pharmacy by e-scribe. 

## 2014-06-16 ENCOUNTER — Encounter: Payer: Self-pay | Admitting: Internal Medicine

## 2014-06-16 ENCOUNTER — Ambulatory Visit (INDEPENDENT_AMBULATORY_CARE_PROVIDER_SITE_OTHER): Payer: PRIVATE HEALTH INSURANCE | Admitting: Internal Medicine

## 2014-06-16 VITALS — BP 140/78 | Temp 98.1°F | Ht 63.25 in | Wt 140.9 lb

## 2014-06-16 DIAGNOSIS — J040 Acute laryngitis: Secondary | ICD-10-CM

## 2014-06-16 DIAGNOSIS — J069 Acute upper respiratory infection, unspecified: Secondary | ICD-10-CM

## 2014-06-16 DIAGNOSIS — J0100 Acute maxillary sinusitis, unspecified: Secondary | ICD-10-CM

## 2014-06-16 DIAGNOSIS — R8290 Unspecified abnormal findings in urine: Secondary | ICD-10-CM

## 2014-06-16 LAB — POCT URINALYSIS DIP (MANUAL ENTRY)
Bilirubin, UA: NEGATIVE
Blood, UA: NEGATIVE
Glucose, UA: NEGATIVE
Ketones, POC UA: NEGATIVE
Leukocytes, UA: NEGATIVE
Nitrite, UA: NEGATIVE
Protein Ur, POC: NEGATIVE
Spec Grav, UA: 1.01
Urobilinogen, UA: 0.2
pH, UA: 5.5

## 2014-06-16 MED ORDER — CEFUROXIME AXETIL 500 MG PO TABS: 500.0000 mg | ORAL_TABLET | Freq: Two times a day (BID) | ORAL | Status: DC

## 2014-06-16 NOTE — Progress Notes (Signed)
Pre visit review using our clinic review tool, if applicable. No additional management support is needed unless otherwise documented below in the visit note.  Chief Complaint  Patient presents with  . Nasal Congestion    Started after Christmas.  . Sinus Pressure/Pain    HPI: Patient Karen Spears  comes in today for SDA for  new problem evaluation. Problem since  Before x mas  otc and never got completely away.   Burning and chest and and  thick post  nasal drainage .   Better and then never went way.    ? Low grade fever   Hard to blow.  Not response to decongestants  Cough but minor no wheezing   Facial pain and throbbing and jaw teeth area  Husband seen x 2 dfor sinusinfection in last month prilosec every day .  Odor to urine .   ROS: See pertinent positives and negatives per HPI. Using alpraz at night on ly rarely in day  No wheezing exposures  Bad oder of urine ? From prilosec?  No uti sx   Past Medical History  Diagnosis Date  . Migraines   . Bladder polyps     and stones dr Amalia Hailey   . Headache(784.0)   . 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 Allergic rhinitis   . MIGRAINE HEADACHE 03/29/2009    Qualifier: Diagnosis of  By: Regis Bill MD, Standley Brooking   . Bronchospasm 08/20/2012    Much improved on steroid inhaler and removal from old the house. At this time hold on specialty referral consider PFTs in the future maintain on Symbicort for n   . Reaction to severe stress 12/31/2011  . Drug rash 05/13/2011    ? Fixed rash  From diflucan  ?   Marland Kitchen Exposure to mold 07/30/2012  . Asthmatic bronchitis 07/30/2012    Family History  Problem Relation Age of Onset  . Gallstones Mother   . Gallstones Sister     History   Social History  . Marital Status: Married    Spouse Name: N/A    Number of Children: N/A  . Years of Education: N/A   Social History Main Topics  . Smoking status: Never Smoker   . Smokeless tobacco:  Not on file  . Alcohol Use: No  . Drug Use: No  . Sexual Activity: Not on file   Other Topics Concern  . 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    Has parish church    Married   Regular exercise- no some   Has grandchildren   Hs farm property [place] house             Outpatient Encounter Prescriptions as of 06/16/2014  Medication Sig  . atenolol (TENORMIN) 25 MG tablet TAKE 1 TABLET BY MOUTH TWICE DAILY  . ibuprofen (ADVIL,MOTRIN) 800 MG tablet TAKE 1 TABLET BY MOUTH TWICE DAILY AS NEEDED FOR PAIN.  Marland Kitchen lidocaine (LIDODERM) 5 % Place 1 patch onto the skin daily. Remove & Discard patch within 12 hours or as directed by MD  . VIVELLE-DOT 0.1 MG/24HR patch PLACE 1 PATCH ONTO THE SKIN TWICE WEEKLY.  Duanne Moron 0.5 MG tablet TAKE 1 TABLET TWICE DAILY, AND 1 1/2 TABLETS AT BEDTIME AS NEEDED.  . cefUROXime (CEFTIN) 500 MG tablet Take 1 tablet (500 mg total) by  mouth 2 (two) times daily.  . [DISCONTINUED] terbinafine (LAMISIL) 250 MG tablet Take 1 tablet (250 mg total) by mouth daily. (Patient not taking: Reported on 06/16/2014)    EXAM:  BP 140/78 mmHg  Temp(Src) 98.1 F (36.7 C) (Oral)  Ht 5' 3.25" (1.607 m)  Wt 140 lb 14.4 oz (63.912 kg)  BMI 24.75 kg/m2  Body mass index is 24.75 kg/(m^2). WDWN in NAD  quiet respirations;  congested  somewhat hoarse. Non toxic . HEENT: Normocephalic ;atraumatic , Eyes;  PERRL, EOMs  Full, lids and conjunctiva clear,,Ears: no deformities, canals nl, TM landmarks normal, Nose: no deformity mucoid discharge very  congested;facemaxillary tender Mouth : OP clear without lesion or edema . Neck: Supple without adenopathy or masses or bruits Chest:  Clear to A&P without wheezes rales or rhonchi CV:  S1-S2 no gallops or murmurs peripheral perfusion is normal Skin :nl perfusion and no acute rashes   ASSESSMENT AND PLAN:  Discussed the following assessment and  plan:  Acute maxillary sinusitis, recurrence not specified  Protracted URI  Bad odor of urine - Plan: POCT urinalysis dipstick  Laryngitis In regard to alprazolam   Using Brand only  Has one refill left will be calling soon . alos ibuprofen  Stable  -Patient advised to return or notify health care team  if symptoms worsen ,persist or new concerns arise.  Patient Instructions  Sinusitis   treatment  Saline  Nose   Sprays.  i advise  nasacort  Also every day  Saline nose   Spray  To moisturize the nose    Damarious Holtsclaw K. Cielle Aguila M.D.

## 2014-06-16 NOTE — Patient Instructions (Addendum)
Sinusitis   treatment  Saline  Nose   Sprays.  i advise  nasacort  Also every day  Saline nose   Spray  To moisturize the nose

## 2014-07-18 ENCOUNTER — Other Ambulatory Visit: Payer: Self-pay | Admitting: Internal Medicine

## 2014-07-18 NOTE — Telephone Encounter (Signed)
Sent to the pharmacy by e-scribe. 

## 2014-07-18 NOTE — Telephone Encounter (Signed)
Ok to refill x 3 

## 2014-08-21 ENCOUNTER — Other Ambulatory Visit: Payer: Self-pay | Admitting: Family Medicine

## 2014-08-22 ENCOUNTER — Other Ambulatory Visit: Payer: Self-pay | Admitting: Internal Medicine

## 2014-08-22 MED ORDER — XANAX 0.5 MG PO TABS: ORAL_TABLET | ORAL | Status: DC

## 2014-08-22 NOTE — Telephone Encounter (Signed)
Called to the pharmacy and left on machine. 

## 2014-08-22 NOTE — Telephone Encounter (Signed)
Ok to refill x 3 

## 2014-09-26 ENCOUNTER — Encounter: Payer: Self-pay | Admitting: Internal Medicine

## 2014-09-26 ENCOUNTER — Ambulatory Visit (INDEPENDENT_AMBULATORY_CARE_PROVIDER_SITE_OTHER): Payer: PRIVATE HEALTH INSURANCE | Admitting: Internal Medicine

## 2014-09-26 VITALS — BP 124/76 | HR 63 | Temp 97.9°F | Wt 147.0 lb

## 2014-09-26 DIAGNOSIS — G4700 Insomnia, unspecified: Secondary | ICD-10-CM

## 2014-09-26 DIAGNOSIS — Z79890 Hormone replacement therapy (postmenopausal): Secondary | ICD-10-CM

## 2014-09-26 DIAGNOSIS — Z79899 Other long term (current) drug therapy: Secondary | ICD-10-CM

## 2014-09-26 DIAGNOSIS — R12 Heartburn: Secondary | ICD-10-CM

## 2014-09-26 DIAGNOSIS — N951 Menopausal and female climacteric states: Secondary | ICD-10-CM

## 2014-09-26 DIAGNOSIS — R102 Pelvic and perineal pain: Secondary | ICD-10-CM

## 2014-09-26 DIAGNOSIS — R030 Elevated blood-pressure reading, without diagnosis of hypertension: Secondary | ICD-10-CM

## 2014-09-26 NOTE — Progress Notes (Signed)
Chief Complaint  Patient presents with  . Follow-up    HPI: Karen Spears  59 y.o.  Her efor fu med management Chronic disease management    bp: good with pain such  And headaches .  Med helps pelvic hip pain!  Hormones :   Take patch   Since surgery  2008   Since then g=does get   hot flushes   2 x per week.  Disc   Sleep   Dream.    Over and over.   Less stress doing better  But can be irreg  Because of unpredictability of schedule .Child bipolar. Directed to pace of   Work.  Is stress but doing well at this time  Still sees counselor monthly but counselor thinks she is ok.  Just alprazolam using at night .  For now.  Still has a refill  ocass  Congestion  No sob  ocass panic not much   Taking prilosec  For heart butn  Gets  Rebound sx if stops  hasnt tried wean   ROS: See pertinent positives and negatives per HPI.  Past Medical History  Diagnosis Date  . Migraines   . Bladder polyps     and stones dr Amalia Hailey   . Headache(784.0)   . 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 Allergic rhinitis   . MIGRAINE HEADACHE 03/29/2009    Qualifier: Diagnosis of  By: Regis Bill MD, Standley Brooking   . Bronchospasm 08/20/2012    Much improved on steroid inhaler and removal from old the house. At this time hold on specialty referral consider PFTs in the future maintain on Symbicort for n   . Reaction to severe stress 12/31/2011  . Drug rash 05/13/2011    ? Fixed rash  From diflucan  ?   Marland Kitchen Exposure to mold 07/30/2012  . Asthmatic bronchitis 07/30/2012    Family History  Problem Relation Age of Onset  . Gallstones Mother   . Gallstones Sister   . Bipolar disorder      child    History   Social History  . Marital Status: Married    Spouse Name: N/A  . Number of Children: N/A  . Years of Education: N/A   Social History Main Topics  . Smoking status: Never Smoker   . Smokeless tobacco: Not on file  . Alcohol Use: No  . Drug  Use: No  . Sexual Activity: Not on file   Other Topics Concern  . 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 atenolol (TENORMIN) 25 MG tablet TAKE 1 TABLET BY MOUTH TWICE DAILY 60 tablet 11  . ibuprofen (ADVIL,MOTRIN) 800 MG tablet TAKE 1 TABLET BY MOUTH TWICE DAILY AS NEEDED FOR PAIN 60 tablet 2  . lidocaine (LIDODERM) 5 % Place 1 patch onto the skin daily. Remove & Discard patch within 12 hours or as directed by MD 30 patch 3  . VIVELLE-DOT 0.1 MG/24HR patch PLACE 1 PATCH ONTO THE SKIN TWICE WEEKLY. 24 patch 2  .  XANAX 0.5 MG tablet TAKE 1 TABLET TWICE DAILY, AND 1 1/2 TABLETS AT BEDTIME AS NEEDED. 60 tablet 2  . cefUROXime (CEFTIN) 500 MG tablet Take 1 tablet (500 mg total) by mouth 2 (two) times daily. (Patient not taking: Reported on 09/26/2014) 20 tablet 0   No facility-administered medications prior to visit.     EXAM:  BP 124/76 mmHg  Pulse 63  Temp(Src) 97.9 F (36.6 C) (Oral)  Wt 147 lb (66.679 kg)  Body mass index is 25.82 kg/(m^2).  GENERAL: vitals reviewed and listed above, alert, oriented, appears well hydrated and in no acute distress HEENT: atraumatic, conjunctiva  clear, no obvious abnormalities on inspection of external nose and ears PSYCH: pleasant and cooperative, no obvious depression or anxiety  ASSESSMENT AND PLAN:  Discussed the following assessment and plan:  Insomnia - continue med benefit nmore than risk  Medication management  Female pelvic pain  MENOPAUSE-RELATED VASOMOTOR SYMPTOMS - stay on current regimen readress yearly  benefiot more than risk   Postmenopausal HRT (hormone replacement therapy)  Elevated blood pressure reading -  better  Heartburn - dsic use of ppi try alt with h2 blocker if poss  to avoid long term nutritional effects  disc bone health  Total visit 80mins > 50% spent counseling and coordinating care as indicated in above note and in instructions to patient .     -Patient advised to return or notify health care team  if symptoms worsen ,persist or new concerns arise.  Patient Instructions  For now stay on  HRT estrogen patch .   No  change in meds for now . Try every day prilosec alt with ranitidine  Or pepcid .  To help wean.  Wellness visit in 6 months  Or as needed    Bone Health Our bones do many things. They provide structure, protect organs, anchor muscles, and store calcium. Adequate calcium in your diet and weight-bearing physical activity help build strong bones, improve bone amounts, and may reduce the risk of weakening of bones (osteoporosis) later in life. PEAK BONE MASS By age 61, the average woman has acquired most of her skeletal bone mass. A large decline occurs in older adults which increases the risk of osteoporosis. In women this occurs around the time of menopause. It is important for young girls to reach their peak bone mass in order to maintain bone health throughout life. A person with high bone mass as a young adult will be more likely to have a higher bone mass later in life. Not enough calcium consumption and physical activity early on could result in a failure to achieve optimum bone mass in adulthood. OSTEOPOROSIS Osteoporosis is a disease of the bones. It is defined as low bone mass with deterioration of bone structure. Osteoporosis leads to an increase risk of fractures with falls. These fractures commonly happen in the wrist, hip, and spine. While men and women of all ages and background can develop osteoporosis, some of the risk factors for osteoporosis are:  Female.  White.  Postmenopausal.  Older adults.  Small in body size.  Eating a diet low in  calcium.  Physically inactive.  Smoking.  Use of some medications.  Family history. CALCIUM Calcium is a mineral needed by the body for healthy bones, teeth, and proper function of the heart, muscles, and nerves. The body cannot produce calcium so it must be absorbed through food. Good sources of calcium include:  Dairy products (low fat or nonfat milk, cheese, and  yogurt).  Dark green leafy vegetables (bok choy and broccoli).  Calcium fortified foods (orange juice, cereal, bread, soy beverages, and tofu products).  Nuts (almonds). Recommended amounts of calcium vary for individuals. RECOMMENDED CALCIUM INTAKES Age and Amount in mg per day  Children 1 to 3 years / 700 mg  Children 4 to 8 years / 1,000 mg  Children 9 to 13 years / 1,300 mg  Teens 14 to 18 years / 1,300 mg  Adults 19 to 50 years / 1,000 mg  Adult women 51 to 70 years / 1,200 mg  Adults 71 years and older / 1,200 mg  Pregnant and breastfeeding teens / 1,300 mg  Pregnant and breastfeeding adults / 1,000 mg Vitamin D also plays an important role in healthy bone development. Vitamin D helps in the absorption of calcium. WEIGHT-BEARING PHYSICAL ACTIVITY Regular physical activity has many positive health benefits. Benefits include strong bones. Weight-bearing physical activity early in life is important in reaching peak bone mass. Weight-bearing physical activities cause muscles and bones to work against gravity. Some examples of weight bearing physical activities include:  Walking, jogging, or running.  [employer].  Jumping rope.  Dancing.  Soccer.  Tennis or Racquetball.  Stair climbing.  Basketball.  Hiking.  Weight lifting.  Aerobic fitness classes. Including weight-bearing physical activity into an exercise plan is a great way to keep bones healthy. Adults: Engage in at least 30 minutes of moderate physical activity on most, preferably all, days of the week. Children: Engage in at  least 60 minutes of moderate physical activity on most, preferably all, days of the week. FOR MORE INFORMATION [place] Web designer, Soil scientist for [employer] and Promotion: www.cnpp.usda.[place]: EquipmentWeekly.com.ee Document Released: 07/19/2003 Document Revised: 08/23/2012 Document Reviewed: 10/18/2008 [facility] Patient Information 2015 [place], [place]. This information is not intended to replace advice given to you by your health care provider. Make sure you discuss any questions you have with your health care provider.       Standley Brooking. Tylin Stradley M.D.

## 2014-09-26 NOTE — Progress Notes (Signed)
Pre visit review using our clinic review tool, if applicable. No additional management support is needed unless otherwise documented below in the visit note. 

## 2014-09-26 NOTE — Patient Instructions (Addendum)
For now stay on  HRT estrogen patch .   No  change in meds for now . Try every day prilosec alt with ranitidine  Or pepcid .  To help wean.  Wellness visit in 6 months  Or as needed    Bone Health Our bones do many things. They provide structure, protect organs, anchor muscles, and store calcium. Adequate calcium in your diet and weight-bearing physical activity help build strong bones, improve bone amounts, and may reduce the risk of weakening of bones (osteoporosis) later in life. PEAK BONE MASS By age 73, the average woman has acquired most of her skeletal bone mass. A large decline occurs in older adults which increases the risk of osteoporosis. In women this occurs around the time of menopause. It is important for young girls to reach their peak bone mass in order to maintain bone health throughout life. A person with high bone mass as a young adult will be more likely to have a higher bone mass later in life. Not enough calcium consumption and physical activity early on could result in a failure to achieve optimum bone mass in adulthood. OSTEOPOROSIS Osteoporosis is a disease of the bones. It is defined as low bone mass with deterioration of bone structure. Osteoporosis leads to an increase risk of fractures with falls. These fractures commonly happen in the wrist, hip, and spine. While men and women of all ages and background can develop osteoporosis, some of the risk factors for osteoporosis are:  Female.  White.  Postmenopausal.  Older adults.  Small in body size.  Eating a diet low in calcium.  Physically inactive.  Smoking.  Use of some medications.  Family history. CALCIUM Calcium is a mineral needed by the body for healthy bones, teeth, and proper function of the heart, muscles, and nerves. The body cannot produce calcium so it must be absorbed through food. Good sources of calcium include:  Dairy products (low fat or nonfat milk, cheese, and yogurt).  Dark green  leafy vegetables (bok choy and broccoli).  Calcium fortified foods (orange juice, cereal, bread, soy beverages, and tofu products).  Nuts (almonds). Recommended amounts of calcium vary for individuals. RECOMMENDED CALCIUM INTAKES Age and Amount in mg per day  Children 1 to 3 years / 700 mg  Children 4 to 8 years / 1,000 mg  Children 9 to 13 years / 1,300 mg  Teens 14 to 18 years / 1,300 mg  Adults 19 to 50 years / 1,000 mg  Adult women 51 to 70 years / 1,200 mg  Adults 71 years and older / 1,200 mg  Pregnant and breastfeeding teens / 1,300 mg  Pregnant and breastfeeding adults / 1,000 mg Vitamin D also plays an important role in healthy bone development. Vitamin D helps in the absorption of calcium. WEIGHT-BEARING PHYSICAL ACTIVITY Regular physical activity has many positive health benefits. Benefits include strong bones. Weight-bearing physical activity early in life is important in reaching peak bone mass. Weight-bearing physical activities cause muscles and bones to work against gravity. Some examples of weight bearing physical activities include:  Walking, jogging, or running.  [employer].  Jumping rope.  Dancing.  Soccer.  Tennis or Racquetball.  Stair climbing.  Basketball.  Hiking.  Weight lifting.  Aerobic fitness classes. Including weight-bearing physical activity into an exercise plan is a great way to keep bones healthy. Adults: Engage in at least 30 minutes of moderate physical activity on most, preferably all, days of the week. Children: Engage in  at least 60 minutes of moderate physical activity on most, preferably all, days of the week. FOR MORE INFORMATION [place] Web designer, Soil scientist for [employer] and Promotion: www.cnpp.usda.[place]: EquipmentWeekly.com.ee Document Released: 07/19/2003 Document Revised: 08/23/2012 Document Reviewed: 10/18/2008 [facility] Patient Information 2015 [place], [place].  This information is not intended to replace advice given to you by your health care provider. Make sure you discuss any questions you have with your health care provider.

## 2014-12-13 ENCOUNTER — Other Ambulatory Visit: Payer: Self-pay | Admitting: Internal Medicine

## 2014-12-13 NOTE — Telephone Encounter (Signed)
Ok x 2  

## 2014-12-14 NOTE — Telephone Encounter (Signed)
Called to the pharmacy by e-scribe. 

## 2014-12-15 ENCOUNTER — Other Ambulatory Visit: Payer: Self-pay | Admitting: Internal Medicine

## 2014-12-18 NOTE — Telephone Encounter (Signed)
Sent to the pharmacy by e-scribe.  Pt should return in Nov.

## 2014-12-19 LAB — HM MAMMOGRAPHY

## 2014-12-22 ENCOUNTER — Encounter: Payer: Self-pay | Admitting: Family Medicine

## 2015-01-09 ENCOUNTER — Other Ambulatory Visit: Payer: Self-pay | Admitting: Internal Medicine

## 2015-01-10 ENCOUNTER — Telehealth: Payer: Self-pay | Admitting: Family Medicine

## 2015-01-10 ENCOUNTER — Other Ambulatory Visit: Payer: Self-pay | Admitting: Family Medicine

## 2015-01-10 DIAGNOSIS — Z0000 Encounter for general adult medical examination without abnormal findings: Secondary | ICD-10-CM

## 2015-01-10 NOTE — Telephone Encounter (Signed)
This patient is due for a cpx and lab work in Nov.  Please help her to make both appointments. I have ordered the lab work.

## 2015-01-10 NOTE — Telephone Encounter (Signed)
Sent to the pharmacy by e-scribe. 

## 2015-01-11 NOTE — Telephone Encounter (Signed)
lmom for pt to call back

## 2015-01-11 NOTE — Telephone Encounter (Signed)
Pt is driving and will callback to sch physical

## 2015-02-06 ENCOUNTER — Other Ambulatory Visit: Payer: Self-pay | Admitting: Internal Medicine

## 2015-02-06 NOTE — Telephone Encounter (Signed)
Ok to refill ibuprofen x 3  alprazolam x 2

## 2015-02-06 NOTE — Telephone Encounter (Signed)
Last seen 09/26/14, Ibuprofen last filled 12/18/14, Xanax last filled 12/14/14

## 2015-02-07 NOTE — Telephone Encounter (Signed)
Could you please send and print this RX for Panosh.

## 2015-03-10 ENCOUNTER — Other Ambulatory Visit: Payer: Self-pay | Admitting: Internal Medicine

## 2015-03-29 ENCOUNTER — Other Ambulatory Visit: Payer: Self-pay | Admitting: Internal Medicine

## 2015-03-30 NOTE — Telephone Encounter (Signed)
Sent to the pharmacy by e-scribe. 

## 2015-04-04 ENCOUNTER — Other Ambulatory Visit: Payer: Self-pay | Admitting: Internal Medicine

## 2015-04-04 NOTE — Telephone Encounter (Signed)
Sent to the pharmacy by e-scribe. 

## 2015-04-30 ENCOUNTER — Encounter: Payer: Self-pay | Admitting: Family Medicine

## 2015-04-30 ENCOUNTER — Ambulatory Visit (INDEPENDENT_AMBULATORY_CARE_PROVIDER_SITE_OTHER): Payer: PRIVATE HEALTH INSURANCE | Admitting: Family Medicine

## 2015-04-30 VITALS — BP 148/71 | HR 61 | Temp 97.9°F | Ht 63.25 in | Wt 150.0 lb

## 2015-04-30 DIAGNOSIS — B351 Tinea unguium: Secondary | ICD-10-CM

## 2015-04-30 DIAGNOSIS — J019 Acute sinusitis, unspecified: Secondary | ICD-10-CM | POA: Diagnosis not present

## 2015-04-30 MED ORDER — LEVOFLOXACIN 500 MG PO TABS: 500.0000 mg | ORAL_TABLET | Freq: Every day | ORAL | Status: AC

## 2015-04-30 MED ORDER — ESTRADIOL 0.1 MG/24HR TD PTTW: MEDICATED_PATCH | TRANSDERMAL | Status: DC

## 2015-04-30 MED ORDER — TERBINAFINE HCL 250 MG PO TABS: 250.0000 mg | ORAL_TABLET | Freq: Every day | ORAL | Status: DC

## 2015-04-30 NOTE — Progress Notes (Signed)
   Subjective:    Patient ID: Karen Spears, female    DOB: May 06, 1956, 59 y.o.   MRN: [id number]  HPI Here for several tings. First for one week she has had sinus pressure, PND, and a HA. No fever or cough. Also she has had yellow thickened toenails for the past year. Also she needs Vivelle dot refilled.    Review of Systems  Constitutional: Negative.   HENT: Positive for congestion, postnasal drip and sinus pressure. Negative for ear pain and sore throat.   Eyes: Negative.   Respiratory: Negative.        Objective:   Physical Exam  Constitutional: She appears well-developed and well-nourished.  HENT:  Right Ear: External ear normal.  Left Ear: External ear normal.  Nose: Nose normal.  Mouth/Throat: Oropharynx is clear and moist.  Eyes: Conjunctivae are normal.  Neck: No thyromegaly present.  Pulmonary/Chest: Effort normal and breath sounds normal.  Lymphadenopathy:    She has no cervical adenopathy.  Skin:  All toenails show fungal involvement          Assessment & Plan:  Treat the sinusitis with Levaquin. Treat the nails with Terbinafine. Refilled Vivelle dot.

## 2015-04-30 NOTE — Progress Notes (Signed)
Pre visit review using our clinic review tool, if applicable. No additional management support is needed unless otherwise documented below in the visit note. 

## 2015-06-21 ENCOUNTER — Other Ambulatory Visit: Payer: Self-pay | Admitting: Family Medicine

## 2015-06-22 ENCOUNTER — Other Ambulatory Visit: Payer: PRIVATE HEALTH INSURANCE

## 2015-06-22 MED ORDER — LIDOCAINE 5 % EX PTCH
1.0000 | MEDICATED_PATCH | CUTANEOUS | Status: DC
Start: 2015-06-22 — End: 2015-06-25

## 2015-06-25 MED ORDER — LIDOCAINE 5 % EX PTCH: 1.0000 | MEDICATED_PATCH | CUTANEOUS | Status: DC

## 2015-06-25 NOTE — Addendum Note (Signed)
Addended by: Miles Costain T on: 06/25/2015 10:18 AM   Modules accepted: Orders

## 2015-06-28 ENCOUNTER — Other Ambulatory Visit (INDEPENDENT_AMBULATORY_CARE_PROVIDER_SITE_OTHER): Payer: PRIVATE HEALTH INSURANCE

## 2015-06-28 DIAGNOSIS — Z0000 Encounter for general adult medical examination without abnormal findings: Secondary | ICD-10-CM

## 2015-06-28 LAB — HEPATIC FUNCTION PANEL
ALT: 12 U/L (ref 0–35)
AST: 15 U/L (ref 0–37)
Albumin: 4.2 g/dL (ref 3.5–5.2)
Alkaline Phosphatase: 46 U/L (ref 39–117)
BILIRUBIN DIRECT: 0.1 mg/dL (ref 0.0–0.3)
BILIRUBIN TOTAL: 0.4 mg/dL (ref 0.2–1.2)
Total Protein: 6.2 g/dL (ref 6.0–8.3)

## 2015-06-28 LAB — CBC WITH DIFFERENTIAL/PLATELET
BASOS PCT: 0.6 % (ref 0.0–3.0)
Basophils Absolute: 0 10*3/uL (ref 0.0–0.1)
EOS ABS: 0.1 10*3/uL (ref 0.0–0.7)
Eosinophils Relative: 2.1 % (ref 0.0–5.0)
HCT: 37.8 % (ref 36.0–46.0)
HEMOGLOBIN: 13 g/dL (ref 12.0–15.0)
Lymphocytes Relative: 31.2 % (ref 12.0–46.0)
Lymphs Abs: 1.6 10*3/uL (ref 0.7–4.0)
MCHC: 34.3 g/dL (ref 30.0–36.0)
MCV: 85.3 fl (ref 78.0–100.0)
MONO ABS: 0.3 10*3/uL (ref 0.1–1.0)
Monocytes Relative: 6.5 % (ref 3.0–12.0)
NEUTROS ABS: 3.1 10*3/uL (ref 1.4–7.7)
Neutrophils Relative %: 59.6 % (ref 43.0–77.0)
PLATELETS: 225 10*3/uL (ref 150.0–400.0)
RBC: 4.44 Mil/uL (ref 3.87–5.11)
RDW: 12.6 % (ref 11.5–15.5)
WBC: 5.2 10*3/uL (ref 4.0–10.5)

## 2015-06-28 LAB — BASIC METABOLIC PANEL
BUN: 20 mg/dL (ref 6–23)
CALCIUM: 8.8 mg/dL (ref 8.4–10.5)
CHLORIDE: 106 meq/L (ref 96–112)
CO2: 25 mEq/L (ref 19–32)
CREATININE: 0.66 mg/dL (ref 0.40–1.20)
GFR: 97.65 mL/min (ref >60.00)
Glucose, Bld: 90 mg/dL (ref 70–99)
Potassium: 4.2 mEq/L (ref 3.5–5.1)
Sodium: 139 mEq/L (ref 135–145)

## 2015-06-28 LAB — LIPID PANEL
CHOLESTEROL: 164 mg/dL (ref 0–200)
HDL: 53.7 mg/dL (ref >39.00)
LDL Cholesterol: 101 mg/dL — ABNORMAL HIGH (ref 0–99)
NonHDL: 109.81
Total CHOL/HDL Ratio: 3
Triglycerides: 46 mg/dL (ref 0.0–149.0)
VLDL: 9.2 mg/dL (ref 0.0–40.0)

## 2015-06-28 LAB — TSH: TSH: 2.29 u[IU]/mL (ref 0.35–4.50)

## 2015-07-02 ENCOUNTER — Telehealth: Payer: Self-pay | Admitting: Family Medicine

## 2015-07-02 ENCOUNTER — Encounter: Payer: PRIVATE HEALTH INSURANCE | Admitting: Internal Medicine

## 2015-07-02 NOTE — Telephone Encounter (Signed)
Pt missed her appt today with WP.  Tried to reach her on home number but received a recording that the number or code I dialed was incorrect.  Tried cell phone but received a message that pt is not accepting calls at this time.  Voicemail was not an option.

## 2015-07-02 NOTE — Progress Notes (Signed)
Document opened and reviewed for wellness visit . No showed . Forgot  Came in later that week

## 2015-07-04 ENCOUNTER — Encounter: Payer: Self-pay | Admitting: Internal Medicine

## 2015-07-04 ENCOUNTER — Ambulatory Visit (INDEPENDENT_AMBULATORY_CARE_PROVIDER_SITE_OTHER): Payer: PRIVATE HEALTH INSURANCE | Admitting: Internal Medicine

## 2015-07-04 VITALS — BP 128/80 | HR 69 | Temp 98.1°F | Wt 149.8 lb

## 2015-07-04 DIAGNOSIS — Z79899 Other long term (current) drug therapy: Secondary | ICD-10-CM | POA: Diagnosis not present

## 2015-07-04 DIAGNOSIS — T887XXA Unspecified adverse effect of drug or medicament, initial encounter: Secondary | ICD-10-CM

## 2015-07-04 DIAGNOSIS — G4700 Insomnia, unspecified: Secondary | ICD-10-CM | POA: Diagnosis not present

## 2015-07-04 DIAGNOSIS — R102 Pelvic and perineal pain: Secondary | ICD-10-CM | POA: Diagnosis not present

## 2015-07-04 DIAGNOSIS — N951 Menopausal and female climacteric states: Secondary | ICD-10-CM

## 2015-07-04 DIAGNOSIS — B351 Tinea unguium: Secondary | ICD-10-CM

## 2015-07-04 DIAGNOSIS — T50905A Adverse effect of unspecified drugs, medicaments and biological substances, initial encounter: Secondary | ICD-10-CM

## 2015-07-04 NOTE — Progress Notes (Signed)
Chief Complaint  Patient presents with  . Medication Refill    HPI: Patient Karen Spears  comes in today  for education evaluation.  She missed her checkup appointment this week because she had lost her calendar. But needs to review medications to help with refills for future. She is generally stable. Problem #1 taking alprazolam one and half at night and occasional corder before Sunday or events were she has to perform. Works well. Her insurance will no longer cover brand-name alprazolam without a form of evaluation prior off.. In the past she has used generic alprazolam and has what she calls were me dreams medicine with your head. Does not occur on the brand name. In January she tried the generic again when the insurance changed and she got the same side effect and doesn't think it was anticipatory.  Headaches are stable Uses ibuprofen 800 mg at night and then as needed for her pelvic pain. Her toenails are still very thickened disappointed that she got a side effect from Lamisil oral that she calls burning nose and in her private parts similar to what she got from fluconazole. Is planning on using a topical as per pharmacy but asked advice. This problem is been going on about a year she did have a trauma to her left toenail and then improved and then got thickened toenails bilaterally. Denies depression or panic attacks ROS: See pertinent positives and negatives per HPI. No significant respiratory symptoms or recurrence of her reactive airway no blood in her urine or stool. Hasn't had a colonoscopy think she will do it this summer negative family history.  Past Medical History  Diagnosis Date  . Migraines   . Bladder polyps     and stones dr Amalia Hailey   . Headache(784.0)   . 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 Allergic rhinitis   . MIGRAINE HEADACHE 03/29/2009    Qualifier: Diagnosis of  By: Regis Bill MD, Standley Brooking   .  Bronchospasm 08/20/2012    Much improved on steroid inhaler and removal from old the house. At this time hold on specialty referral consider PFTs in the future maintain on Symbicort for n   . Reaction to severe stress 12/31/2011  . Drug rash 05/13/2011    ? Fixed rash  From diflucan  ?   Marland Kitchen Exposure to mold 07/30/2012  . Asthmatic bronchitis 07/30/2012    Family History  Problem Relation Age of Onset  . Gallstones Mother   . Gallstones Sister   . Bipolar disorder      child    Social History   Social History  . Marital Status: Married    Spouse Name: N/A  . Number of Children: N/A  . Years of Education: N/A   Social History Main Topics  . Smoking status: Never Smoker   . Smokeless tobacco: None  . Alcohol Use: No  . Drug Use: No  . Sexual Activity: Not Asked   Other Topics Concern  . 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  atenolol (TENORMIN) 25 MG tablet TAKE 1 TABLET BY MOUTH TWICE DAILY 60 tablet 2  . estradiol (VIVELLE-DOT) 0.1 MG/24HR patch PLACE 1 PATCH ONTO THE SKIN TWICE WEEKLY 24 patch 0  . ibuprofen (ADVIL,MOTRIN) 800 MG tablet TAKE 1 TABLET BY MOUTH TWICE DAILY AS NEEDED FOR PAIN 60 tablet 0  . lidocaine (LIDODERM) 5 % Place 1 patch onto the skin daily. Remove & Discard patch within 12 hours or as directed by MD 30 patch 3  . XANAX 0.5 MG tablet TAKE 1 TABLET BY MOUTH TWICE DAILY. MAY TAKE 1.5 TABLETS EVERY NIGHT AT BEDTIME 60 tablet 2  . ibuprofen (ADVIL,MOTRIN) 800 MG tablet TAKE 1 TABLET BY MOUTH TWICE DAILY AS NEEDED FOR PAIN 60 tablet 3  . terbinafine (LAMISIL) 250 MG tablet Take 1 tablet (250 mg total) by mouth daily. (Patient not taking: Reported on  07/04/2015) 90 tablet 0   No facility-administered medications prior to visit.     EXAM:  BP 128/80 mmHg  Pulse 69  Temp(Src) 98.1 F (36.7 C) (Oral)  Wt 149 lb 12.8 oz (67.949 kg)  Body mass index is 26.31 kg/(m^2).  GENERAL: vitals reviewed and listed above, alert, oriented, appears well hydrated and in no acute distress looks well and her blood pressure is good. HEENT: atraumatic, conjunctiva  clear, no obvious abnormalities on inspection of external nose and earsNECK: no obvious masses on inspection palpation  LUNGS: clear to auscultation bilaterally, no wheezes, rales or rhonchi, good air movement CV: HRRR, no clubbing cyanosis or  peripheral edema nl cap refill  MS: moves all extremities without noticeable focal  Abnormality Skin clear to great toenails with distal mycotic looking thickened nail. PSYCH: pleasant and cooperative, no obvious depression or anxiety  ASSESSMENT AND PLAN:  Discussed the following assessment and plan:  Insomnia  Medication management - worming adverse dreams on generic alpraz (  x 2 trials) no on brand med form to complete   MENOPAUSE-RELATED VASOMOTOR SYMPTOMS - better on patch   Female pelvic pain - ibu 800 hs and prn patch  lidoderm at times  Onychomycosis - se of flucon and terben  see derm about toe nails   Medication side effect, initial encounter - had nose burnignmucosal and ? vaginal burning  with lamisil oral as with diflucan? She missed her preventive visit but we did review some preventive parameters. Discussed flu vaccine declined it today. His latencies in. Cautious use of the alprazolam with no ramp-up mostly at night with a small amount occasionally before having to speak /perform at events. Plan follow-up visit in about 6 months med check about how she is doing but she can contact us for referrals in the meantime. Form to be completed for her insurance company it appears that the generic alprazolam causes significant nocturnal's  dream side effects noted on 2 trials. Benefit more than risk of current medication regimen. To continue  -Patient advised to return or notify health care team  if symptoms worsen ,persist or new concerns arise.  Patient Instructions   dermatologist    Opinion.about the nail issues and side effects .  don't know why you have reaction to these meds  ... Will send in information    PA  For the alprazolam  And the hx of side effects with generic.   Lab Results  Component Value Date   WBC 5.2 06/28/2015   HGB 13.0 06/28/2015   HCT 37.8 06/28/2015   PLT 225.0 06/28/2015   GLUCOSE 90 06/28/2015  CHOL 164 06/28/2015   TRIG 46.0 06/28/2015   HDL 53.70 06/28/2015   LDLCALC 101* 06/28/2015   ALT 12 06/28/2015   AST 15 06/28/2015   NA 139 06/28/2015   K 4.2 06/28/2015   CL 106 06/28/2015   CREATININE 0.66 06/28/2015   BUN 20 06/28/2015   CO2 25 06/28/2015   TSH 2.29 06/28/2015    Let us know when want to do the colonoscopy .     Standley Brooking. Genevieve Ritzel M.D.

## 2015-07-04 NOTE — Progress Notes (Signed)
Pre visit review using our clinic review tool, if applicable. No additional management support is needed unless otherwise documented below in the visit note. 

## 2015-07-04 NOTE — Patient Instructions (Addendum)
dermatologist    Opinion.about the nail issues and side effects .  don't know why you have reaction to these meds  ... Will send in information    PA  For the alprazolam  And the hx of side effects with generic.   Lab Results  Component Value Date   WBC 5.2 06/28/2015   HGB 13.0 06/28/2015   HCT 37.8 06/28/2015   PLT 225.0 06/28/2015   GLUCOSE 90 06/28/2015   CHOL 164 06/28/2015   TRIG 46.0 06/28/2015   HDL 53.70 06/28/2015   LDLCALC 101* 06/28/2015   ALT 12 06/28/2015   AST 15 06/28/2015   NA 139 06/28/2015   K 4.2 06/28/2015   CL 106 06/28/2015   CREATININE 0.66 06/28/2015   BUN 20 06/28/2015   CO2 25 06/28/2015   TSH 2.29 06/28/2015    Let us know when want to do the colonoscopy .

## 2015-07-11 ENCOUNTER — Encounter: Payer: Self-pay | Admitting: Internal Medicine

## 2015-07-11 ENCOUNTER — Other Ambulatory Visit: Payer: Self-pay | Admitting: Internal Medicine

## 2015-07-11 NOTE — Telephone Encounter (Signed)
Ok x   6 months for atenolol  Refill x  3 for the alprazolam

## 2015-07-12 ENCOUNTER — Telehealth: Payer: Self-pay | Admitting: Internal Medicine

## 2015-07-12 NOTE — Telephone Encounter (Signed)
Zurna from [place] called she need clarification  directions on medication Xanax// please advise  [phone]

## 2015-07-12 NOTE — Telephone Encounter (Signed)
I contacted Zurna regarding the question about Xanax - she was a little confused because the directions were : TAKE ONE TABLET DURING THE DAY. TAKE 1/2 TO 1 TABLET AT BEDTIME. I went and got clarification from Dr. Regis Bill - she states that the patient's instructions should be: TAKE 1/2 TO 1 TABLET AT BEDTIME TIME. MAY TAKE ADDITIONAL 1 TABLET DURING THE DAY IF NEEDED. Zurna corrected Rx. Thanks.

## 2015-07-12 NOTE — Telephone Encounter (Signed)
Called to the pharmacy and left on machine (Xanax) Atenolol sent by e-scribe.

## 2015-07-16 NOTE — Telephone Encounter (Signed)
[facility] called for pt. Pt would like alprazolam, not brand name XANAX 0.5 MG tablet. Please call in new RX with generic

## 2015-07-16 NOTE — Telephone Encounter (Signed)
We could  Try belsomra  For sleep with coupon    10 mg disp 10  Hs  psn and if needed can go up to 15 mg   Or 20 mg  Still scheduled med but  Lower risk than ambien . Prefer  Not to use ambien and xanax .

## 2015-07-17 MED ORDER — ALPRAZOLAM 0.5 MG PO TABS: ORAL_TABLET | ORAL | Status: DC

## 2015-07-17 NOTE — Telephone Encounter (Signed)
Sounds  Like a good plan  Misty  Please renew alprazolam as a generic  ( #   60 2 refills) or contact pharmacy and ask to fill as  A generic

## 2015-07-17 NOTE — Telephone Encounter (Signed)
Pharm called back again, pt wants the generic alprazolam. Has decided Xanax is too expensive [facility]

## 2015-08-16 ENCOUNTER — Other Ambulatory Visit: Payer: Self-pay | Admitting: Family Medicine

## 2015-08-16 NOTE — Telephone Encounter (Signed)
Sent to the pharmacy by e-scribe. 

## 2015-09-10 ENCOUNTER — Other Ambulatory Visit: Payer: Self-pay | Admitting: Internal Medicine

## 2015-09-11 NOTE — Telephone Encounter (Signed)
FILLED ON 08/16/15 FOR 6 MONTHS.  REQUEST IS EARLY

## 2015-09-18 ENCOUNTER — Other Ambulatory Visit: Payer: Self-pay | Admitting: Internal Medicine

## 2015-09-19 NOTE — Telephone Encounter (Signed)
Can refill the patch x 6 months  Refill alprazolam with one additional refill

## 2015-09-19 NOTE — Telephone Encounter (Signed)
Patches sent by e-scribe Alprazolam called to the pharmacy and left on machine.

## 2015-10-22 ENCOUNTER — Telehealth: Payer: Self-pay | Admitting: Internal Medicine

## 2015-10-22 NOTE — Telephone Encounter (Signed)
FYI; Pt did not want to see another doctor for her sickness,she said I am going to let Dr Regis Bill know I had to wait to see her.

## 2015-10-22 NOTE — Telephone Encounter (Signed)
Spoke to the pt.  She complains of st, ear pain, bodyaches, chills and possible fever.  Has been traveling with her husband.  Pt rescheduled from 10/29/15 to 10/23/15.

## 2015-10-23 ENCOUNTER — Encounter: Payer: Self-pay | Admitting: Internal Medicine

## 2015-10-23 ENCOUNTER — Ambulatory Visit (INDEPENDENT_AMBULATORY_CARE_PROVIDER_SITE_OTHER): Payer: PRIVATE HEALTH INSURANCE | Admitting: Internal Medicine

## 2015-10-23 VITALS — BP 150/80 | Temp 98.0°F | Wt 154.8 lb

## 2015-10-23 DIAGNOSIS — J019 Acute sinusitis, unspecified: Secondary | ICD-10-CM

## 2015-10-23 DIAGNOSIS — R509 Fever, unspecified: Secondary | ICD-10-CM | POA: Diagnosis not present

## 2015-10-23 DIAGNOSIS — R599 Enlarged lymph nodes, unspecified: Secondary | ICD-10-CM | POA: Diagnosis not present

## 2015-10-23 DIAGNOSIS — R590 Localized enlarged lymph nodes: Secondary | ICD-10-CM

## 2015-10-23 MED ORDER — DOXYCYCLINE HYCLATE 100 MG PO TABS: 100.0000 mg | ORAL_TABLET | Freq: Two times a day (BID) | ORAL | Status: DC

## 2015-10-23 NOTE — Patient Instructions (Signed)
This illness could be viral and antibiotics will not help you. However would consider treatment for sinusitis and even tick related diseases that cause fever. Both of these can be treated with doxycycline. Take the doxycycline 7-10 days. Make sure taking it with plenty of water to avoid stomach upset. If you have continued fever and no improvement in the next 3-5 days contact us for plan follow-up for a few get unusual rash. You may want to have reduced work hours over the next 48 hours. Energy level may take 1-2 weeks to get back to normal.

## 2015-10-23 NOTE — Progress Notes (Signed)
Pre visit review using our clinic review tool, if applicable. No additional management support is needed unless otherwise documented below in the visit note.  Chief Complaint  Patient presents with  . Generalized Body Aches  . Sore Throat  . Chills    HPI: Karen Spears 60 y.o.  Here for acute visitFeels is having a relapsing illness or new illness.  Sx for weeks  of upper respiratory symptoms as she has had a history of sinus problems.  m in law traveled and had PNA  Grand son had "sinus infection"   And shared room thgea when recently traveled on a trip to [place]. He then developed severe myalgias mostly upper body and hips shaking chills. Then whole body nurt so bad she states she was eating Advil.  Jerking  And was burning up   3 days ago .  Had to go home  To bed.  . Teeth are trobbing  But then better  And now  Glans in neck hurt   No strep exposures  No st   Feels like when had mono in past. midl upper chest congestino  No severe cough  No rashes vomiting UTI symptoms upper mid back hurts but no pleurisy. She thinks her blood pressure is up she feels so bad. ROS: See pertinent positives and negatives per HPI.  Past Medical History  Diagnosis Date  . Migraines   . Bladder polyps     and stones dr Amalia Hailey   . Headache(784.0)   . 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 Allergic rhinitis   . MIGRAINE HEADACHE 03/29/2009    Qualifier: Diagnosis of  By: Regis Bill MD, Standley Brooking   . Bronchospasm 08/20/2012    Much improved on steroid inhaler and removal from old the house. At this time hold on specialty referral consider PFTs in the future maintain on Symbicort for n   . Reaction to severe stress 12/31/2011  . Drug rash 05/13/2011    ? Fixed rash  From diflucan  ?   Marland Kitchen Exposure to mold 07/30/2012  . Asthmatic bronchitis 07/30/2012    Family History  Problem Relation Age of Onset  . Gallstones Mother   . Gallstones Sister     . Bipolar disorder      child    Social History   Social History  . Marital Status: Married    Spouse Name: N/A  . Number of Children: N/A  . Years of Education: N/A   Social History Main Topics  . Smoking status: Never Smoker   . Smokeless tobacco: None  . Alcohol Use: No  . Drug Use: No  . Sexual Activity: Not Asked   Other Topics Concern  . 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 ALPRAZolam (XANAX) 0.5 MG tablet TAKE 1 TABLET TWICE DAILY. MAY TAKE 1&1/2 TABLET AT BEDTIME AS NEEDED. 60 tablet 1  . atenolol (TENORMIN) 25 MG tablet TAKE 1 TABLET BY MOUTH TWICE DAILY 60 tablet 5  . estradiol (VIVELLE-DOT) 0.1 MG/24HR patch  PLACE 1 PATCH ONTO THE SKIN TWICE WEEKLY 24 patch 1  . ibuprofen (ADVIL,MOTRIN) 800 MG tablet TAKE 1 TABLET BY MOUTH TWICE DAILY AS NEEDED FOR PAIN 60 tablet 0  . lidocaine (LIDODERM) 5 % Place 1 patch onto the skin daily. Remove & Discard patch within 12 hours or as directed by MD 30 patch 3  . VIVELLE-DOT 0.1 MG/24HR patch APPLY 1 PATCH EXTERNALLY TO THE SKIN 2 TIMES A WEEK 24 patch 1  . XANAX 0.5 MG tablet TAKE 1 TABLET BY MOUTH TWICE DAILY. MAY TAKE 1 AND 1/2 TABLETS AT BEDTIME 60 tablet 2  . terbinafine (LAMISIL) 250 MG tablet Take 1 tablet (250 mg total) by mouth daily. (Patient not taking: Reported on 07/04/2015) 90 tablet 0   No facility-administered medications prior to visit.     EXAM:  BP 150/80 mmHg  Temp(Src) 98 F (36.7 C) (Oral)  Wt 154 lb 12.8 oz (70.217 kg)  Body mass index is 27.19 kg/(m^2).  GENERAL: vitals reviewed and listed above, alert, oriented, appears well hydrated and in no acute distressNontoxic but doesn't feel  well. HEENT: atraumatic, conjunctiva  clear, no obvious abnormalities on inspection of external nose and ears nares mildly congested minimal face tenderness. OP : no lesion edema or exudate  NECK: no obvious masses on inspection tender ac nodes +1 right more than left JDG palpation  LUNGS: clear to auscultation bilaterally, no wheezes, rales or rhonchi,  CV: HRRR, no clubbing cyanosis or  peripheral edema nl cap refill  MS: moves all extremities without noticeable focal  Abnormality Abdomen:  Sof,t normal bowel sounds without hepatosplenomegaly, no guarding rebound or masses no CVA tenderness  Skin: normal capillary refill ,turgor , color: No acute rashes ,petechiae or bruising  ASSESSMENT AND PLAN:  Discussed the following assessment and plan:  Fever, unspecified - Plan: Culture, Group A Strep  Anterior cervical adenopathy - Appears to be reactive in mild  Acute sinusitis, recurrence not specified, unspecified location  Uncertain cause question relapsing illness sinusitis versus new viral illness mono-like flulike. Discussed getting blood tests laboratory tests patient would rather be treated for sinus infection just in case. She does have a history of a bite and has been exposed [place]. Risk benefit of medication discussed. Expectant management. Close follow-up is appropriate strep culture done today despite no sore throat but just mild cervical adenopathy because Doxy will not cover strep infection very well. -Patient advised to return or notify health care team  if symptoms worsen ,persist or new concerns arise.  Patient Instructions  This illness could be viral and antibiotics will not help you. However would consider treatment for sinusitis and even tick related diseases that cause fever. Both of these can be treated with doxycycline. Take the doxycycline 7-10 days. Make sure taking it with plenty of water to avoid stomach upset. If you have continued fever and no  improvement in the next 3-5 days contact us for plan follow-up for a few get unusual rash. You may want to have reduced work hours over the next 48 hours. Energy level may take 1-2 weeks to get back to normal.    Fu app due in august September med check Standley Brooking. Panosh M.D.

## 2015-10-25 ENCOUNTER — Encounter: Payer: Self-pay | Admitting: Internal Medicine

## 2015-10-25 LAB — CULTURE, GROUP A STREP

## 2015-10-25 MED ORDER — CEFUROXIME AXETIL 500 MG PO TABS: 500.0000 mg | ORAL_TABLET | Freq: Two times a day (BID) | ORAL | Status: DC

## 2015-10-25 NOTE — Telephone Encounter (Signed)
Okay per Dr. Regis Bill to send in Karen Spears 500 mg, see lab result and I sent script e-scribe.

## 2015-10-29 ENCOUNTER — Ambulatory Visit: Payer: PRIVATE HEALTH INSURANCE | Admitting: Internal Medicine

## 2015-11-15 ENCOUNTER — Other Ambulatory Visit: Payer: Self-pay | Admitting: Internal Medicine

## 2015-12-19 ENCOUNTER — Other Ambulatory Visit: Payer: Self-pay | Admitting: Internal Medicine

## 2016-01-04 ENCOUNTER — Other Ambulatory Visit: Payer: Self-pay | Admitting: Internal Medicine

## 2016-01-08 NOTE — Telephone Encounter (Signed)
FILLED FOR 6 MONTHS ON 09/19/2015.  SHOULD BE ON FILE.

## 2016-01-09 ENCOUNTER — Other Ambulatory Visit: Payer: Self-pay | Admitting: Internal Medicine

## 2016-01-09 ENCOUNTER — Telehealth: Payer: Self-pay | Admitting: Family Medicine

## 2016-01-09 NOTE — Telephone Encounter (Signed)
Received a phone call from [facility].  Atenolol is currently on back order and has been for quite some time.  Unsure when they will receive a shipment.  Would like to change Atenolol.  Please advise.  Thanks!!

## 2016-01-09 NOTE — Telephone Encounter (Signed)
Stop the Atenolol and switch to Metoprolol succinate 25 mg daily. Call in #30 with no rf.

## 2016-01-09 NOTE — Telephone Encounter (Signed)
Call in #60 with no rf 

## 2016-01-10 MED ORDER — METOPROLOL SUCCINATE ER 25 MG PO TB24: 25.0000 mg | ORAL_TABLET | Freq: Every day | ORAL | 0 refills | Status: DC

## 2016-01-10 NOTE — Telephone Encounter (Signed)
Called and spoke to the pt.  Informed her that Dr. Sarajane Jews has prescribed metoprolol 25 mg extended release to replace the atenolol.  Pt agreed and will pick up at the pharmacy.

## 2016-01-10 NOTE — Telephone Encounter (Signed)
Called to the pharmacy and left on machine. 

## 2016-01-18 ENCOUNTER — Encounter: Payer: Self-pay | Admitting: Internal Medicine

## 2016-01-18 ENCOUNTER — Ambulatory Visit (INDEPENDENT_AMBULATORY_CARE_PROVIDER_SITE_OTHER): Payer: PRIVATE HEALTH INSURANCE | Admitting: Internal Medicine

## 2016-01-18 VITALS — BP 122/80 | Temp 97.6°F | Wt 145.0 lb

## 2016-01-18 DIAGNOSIS — R531 Weakness: Secondary | ICD-10-CM | POA: Diagnosis not present

## 2016-01-18 DIAGNOSIS — R1011 Right upper quadrant pain: Secondary | ICD-10-CM

## 2016-01-18 DIAGNOSIS — R197 Diarrhea, unspecified: Secondary | ICD-10-CM

## 2016-01-18 LAB — COMPREHENSIVE METABOLIC PANEL
ALK PHOS: 49 U/L (ref 39–117)
ALT: 20 U/L (ref 0–35)
AST: 21 U/L (ref 0–37)
Albumin: 3.9 g/dL (ref 3.5–5.2)
BILIRUBIN TOTAL: 0.3 mg/dL (ref 0.2–1.2)
BUN: 17 mg/dL (ref 6–23)
CALCIUM: 8.4 mg/dL (ref 8.4–10.5)
CO2: 28 mEq/L (ref 19–32)
CREATININE: 0.6 mg/dL (ref 0.40–1.20)
Chloride: 107 mEq/L (ref 96–112)
GFR: 108.8 mL/min (ref >60.00)
Glucose, Bld: 90 mg/dL (ref 70–99)
POTASSIUM: 4 meq/L (ref 3.5–5.1)
Sodium: 137 mEq/L (ref 135–145)
Total Protein: 6.4 g/dL (ref 6.0–8.3)

## 2016-01-18 LAB — CBC WITH DIFFERENTIAL/PLATELET
BASOS ABS: 0 10*3/uL (ref 0.0–0.1)
BASOS PCT: 0.3 % (ref 0.0–3.0)
EOS ABS: 0.1 10*3/uL (ref 0.0–0.7)
Eosinophils Relative: 1.8 % (ref 0.0–5.0)
HCT: 36.8 % (ref 36.0–46.0)
Hemoglobin: 12.6 g/dL (ref 12.0–15.0)
LYMPHS PCT: 24.4 % (ref 12.0–46.0)
Lymphs Abs: 1.5 10*3/uL (ref 0.7–4.0)
MCHC: 34.2 g/dL (ref 30.0–36.0)
MCV: 86.1 fl (ref 78.0–100.0)
MONO ABS: 0.4 10*3/uL (ref 0.1–1.0)
Monocytes Relative: 6 % (ref 3.0–12.0)
NEUTROS ABS: 4 10*3/uL (ref 1.4–7.7)
NEUTROS PCT: 67.5 % (ref 43.0–77.0)
Platelets: 222 10*3/uL (ref 150.0–400.0)
RBC: 4.27 Mil/uL (ref 3.87–5.11)
RDW: 13.1 % (ref 11.5–15.5)
WBC: 6 10*3/uL (ref 4.0–10.5)

## 2016-01-18 LAB — SEDIMENTATION RATE: SED RATE: 8 mm/h (ref 0–30)

## 2016-01-18 NOTE — Progress Notes (Signed)
Chief Complaint  Patient presents with  . Multiple Medical Problems    Has has diarrhea with bright red blood (small amount).  RUQ pain X 2wks, Burning in both breasts that comes and goes.  Nasal congestion.    HPI: Karen Spears 60 y.o.  comes in today for a couple of acute problems. About 2 weeks ago insidious onset of right upper quadrant pain discomfort without associated symptoms or radiation and at that time no change in appetite. She has been losing weight on purpose on the lower carbohydrate diet and feels well with this and is taken in exercise raising her tolerance. And endurance. Then about days ago was out to dinner had lots of gas and then explosive diarrhea that she couldn't contain and had incontinence in her car that need to be cleaned out. She had other watery stool the next day and then 2 days later. She did not have constipation or normal stools in between and recently had some blood when she wiped. There is no fever serious abdominal pain in between anorexia or systemic symptoms related to this. Most recently she's having some what she calls burning discomfort in both breasts when she lays down at night with no associated symptoms lumps galactorrhea. When she made appointment for mammography they suggested she get a diagnostic area Has some minor stuffy nose her husband has a cold and a sinus infection. She has had some episodes of what she calls extreme weakness and can't move one in the middle the night she couldn't get out of bed and felt a bit nauseated lasted about 20 or 30 minutes. She's had 3 other episodes to mild 1 after doing excess exercise possibly in the heat with hydration not related to sleep. Doesn't feel it's related to medication either asked about diabetes. She's not taking new medications she will take Xanax for anxiety and sleep at night.   No recent travel does have well water at the [place] but no one else is sick. Had an episode in April she was  literally [place] type [place]. But didn't swallow any unusual water. ROS: See pertinent positives and negatives per HPI.  Past Medical History:  Diagnosis Date  . Allergic rhinitis   . Asthmatic bronchitis 07/30/2012  . Bladder polyps    and stones dr Amalia Hailey   . Bronchospasm 08/20/2012   Much improved on steroid inhaler and removal from old the house. At this time hold on specialty referral consider PFTs in the future maintain on Symbicort for n   . Drug rash 05/13/2011   ? Fixed rash  From diflucan  ?   Marland Kitchen Exposure to mold 07/30/2012  . 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 Headache(784.0)   . MIGRAINE HEADACHE 03/29/2009   Qualifier: Diagnosis of  By: Regis Bill MD, Standley Brooking   . Migraines   . Reaction to severe stress 12/31/2011    Family History  Problem Relation Age of Onset  . Gallstones Mother   . Gallstones Sister   . Bipolar disorder      child    Social History   Social History  . Marital status: Married    Spouse name: N/A  . Number of children: N/A  . Years of education: N/A   Social History Main Topics  . Smoking status: Never Smoker  . Smokeless tobacco: None  .  Alcohol use No  . Drug use: No  . Sexual activity: Not Asked   Other Topics Concern  . 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 ALPRAZolam (XANAX) 0.5 MG tablet TAKE 1 TABLET BY MOUTH TWICE DAILY. MAY TAKE 1 AND 1/2 TABLETS AT BEDTIME AS NEEDED. 60 tablet 0  . estradiol (VIVELLE-DOT) 0.1 MG/24HR patch PLACE 1 PATCH ONTO THE SKIN TWICE WEEKLY 24 patch 1  . ibuprofen (ADVIL,MOTRIN) 800 MG tablet TAKE 1  TABLET BY MOUTH TWICE DAILY AS NEEDED FOR PAIN. 60 tablet 0  . lidocaine (LIDODERM) 5 % Place 1 patch onto the skin daily. Remove & Discard patch within 12 hours or as directed by MD 30 patch 3  . metoprolol succinate (TOPROL-XL) 25 MG 24 hr tablet Take 1 tablet (25 mg total) by mouth daily. 30 tablet 0  . atenolol (TENORMIN) 25 MG tablet TAKE 1 TABLET BY MOUTH TWICE DAILY 60 tablet 5  . cefUROXime (CEFTIN) 500 MG tablet Take 1 tablet (500 mg total) by mouth 2 (two) times daily with a meal. 20 tablet 0  . doxycycline (VIBRA-TABS) 100 MG tablet Take 1 tablet (100 mg total) by mouth 2 (two) times daily. 20 tablet 0  . VIVELLE-DOT 0.1 MG/24HR patch APPLY 1 PATCH EXTERNALLY TO THE SKIN 2 TIMES A WEEK 24 patch 1  . XANAX 0.5 MG tablet TAKE 1 TABLET BY MOUTH TWICE DAILY. MAY TAKE 1 AND 1/2 TABLETS AT BEDTIME 60 tablet 2   No facility-administered medications prior to visit.      EXAM:  BP 122/80 (BP Location: Right Arm, Patient Position: Sitting, Cuff Size: Normal)   Temp 97.6 F (36.4 C) (Oral)   Wt 145 lb (65.8 kg)   BMI 25.48 kg/m   Body mass index is 25.48 kg/m.  GENERAL: vitals reviewed and listed above, alert, oriented, appears well hydrated and in no acute distressNontoxic looks generally well HEENT: atraumatic, conjunctiva  clear, no obvious abnormalities on inspection of external nose and ears tms clear nares min congestion  OP : no lesion edema or exudate  NECK: no obvious masses on inspection palpation  LUNGS: clear to auscultation bilaterally, no wheezes, rales or rhonchi, good air movement Abdomen:  Sof,t normal bowel sounds without hepatosplenomegaly, no guarding rebound or masses no CVA tenderness   Mild tenderness ruq  No boney tendereness  Chest breast no rash or point tenderness  CV: HRRR, no clubbing cyanosis or  peripheral edema nl cap refill  MS: moves all extremities without noticeable focal  abnormality PSYCH: pleasant and cooperative, no obvious depression or  anxiety Skin no acute findings  Wt Readings from Last 3 Encounters:  01/18/16 145 lb (65.8 kg)  10/23/15 154 lb 12.8 oz (70.2 kg)  07/04/15 149 lb 12.8 oz (67.9 kg)    ASSESSMENT AND PLAN:  Discussed the following assessment and plan:  Diarrhea, unspecified type - unusual lab  will need gi referral for colon screen or dx pt will let us know preference  rosee tests  - Plan: Stool culture, Fecal lactoferrin, quant, Giardia/cryptosporidium (EIA), CBC with Differential/Platelet, CMP, Sedimentation rate, US Abdomen  Complete, Stool culture, Fecal lactoferrin, quant, Giardia/cryptosporidium (EIA)  RUQ pain - abd [place]  - Plan: Stool culture, Fecal lactoferrin, quant, Giardia/cryptosporidium (EIA), CBC with Differential/Platelet, CMP, Sedimentation rate, US Abdomen Complete, Stool culture, Fecal lactoferrin, quant, Giardia/cryptosporidium (EIA)  Spell of generalized weakness - uncertain cause  look normal today - Plan: Stool culture, Fecal lactoferrin, quant, Giardia/cryptosporidium (EIA), CBC with Differential/Platelet, CMP, Sedimentation rate, Stool culture, Fecal lactoferrin, quant, Giardia/cryptosporidium (EIA) Not sure what to make of the complaint of burning feeling in both breasts but it sounds neurologic radiating and not related I don't think she needs a diagnostic mammogram. Minor stuffiness history of sinusitis not indicated for antibiotic. She does get recurrent sinusitis last antibiotic was about 6 months ago maybe. Uncertain what to make about the weakness spells one was associated with sleep question sleep paralysis but the others were not possibly related to exertion dehydration stress. Will check metabolic and follow. Get  Flu vaccin at [facility] .  -Patient advised to return or notify health care team  if symptoms worsen ,persist or new concerns arise.  Patient Instructions  Get stool tests and  Ultrasound of the gall bladder .  To better delineate problem  Uncertain cause of the  Spells  although overheating and hydration could cause this  There is also something called sleep paralysis  Upon wakening   But this occurs awakenings .    In the interim add  probiortic      fluorastor  culturelle .  For now .   ROV in  10 - 14 days  Or as needed  You weill need to see gi fro colonscopy routine bu also if continuing sx       Wanda K. Panosh M.D.

## 2016-01-18 NOTE — Patient Instructions (Addendum)
Get stool tests and  Ultrasound of the gall bladder .  To better delineate problem  Uncertain cause of the  Spells although overheating and hydration could cause this  There is also something called sleep paralysis  Upon wakening   But this occurs awakenings .    In the interim add  probiortic      fluorastor  culturelle .  For now .   ROV in  10 - 14 days  Or as needed  You weill need to see gi fro colonscopy routine bu also if continuing sx

## 2016-01-18 NOTE — Progress Notes (Signed)
Pre visit review using our clinic review tool, if applicable. No additional management support is needed unless otherwise documented below in the visit note. 

## 2016-01-21 ENCOUNTER — Encounter: Payer: Self-pay | Admitting: Internal Medicine

## 2016-01-22 LAB — FECAL LACTOFERRIN, QUANT: LACTOFERRIN: NEGATIVE

## 2016-01-24 LAB — HM MAMMOGRAPHY

## 2016-01-25 LAB — GIARDIA/CRYPTOSPORIDIUM (EIA)

## 2016-01-25 LAB — STOOL CULTURE

## 2016-01-28 ENCOUNTER — Encounter: Payer: Self-pay | Admitting: Family Medicine

## 2016-01-30 ENCOUNTER — Ambulatory Visit
Admission: RE | Admit: 2016-01-30 | Discharge: 2016-01-30 | Disposition: A | Discharge status: Home / Self Care | Payer: PRIVATE HEALTH INSURANCE | Source: Ambulatory Visit | Attending: Internal Medicine | Admitting: Internal Medicine

## 2016-01-30 DIAGNOSIS — R1011 Right upper quadrant pain: Secondary | ICD-10-CM

## 2016-01-30 DIAGNOSIS — R197 Diarrhea, unspecified: Secondary | ICD-10-CM

## 2016-02-01 ENCOUNTER — Other Ambulatory Visit: Payer: Self-pay | Admitting: Family Medicine

## 2016-02-01 DIAGNOSIS — R197 Diarrhea, unspecified: Secondary | ICD-10-CM

## 2016-02-04 ENCOUNTER — Encounter: Payer: Self-pay | Admitting: Gastroenterology

## 2016-02-11 ENCOUNTER — Other Ambulatory Visit: Payer: Self-pay | Admitting: Internal Medicine

## 2016-02-13 ENCOUNTER — Ambulatory Visit (INDEPENDENT_AMBULATORY_CARE_PROVIDER_SITE_OTHER): Payer: PRIVATE HEALTH INSURANCE | Admitting: Gastroenterology

## 2016-02-13 VITALS — BP 130/80 | Ht 64.0 in | Wt 142.9 lb

## 2016-02-13 DIAGNOSIS — R1011 Right upper quadrant pain: Secondary | ICD-10-CM | POA: Diagnosis not present

## 2016-02-13 DIAGNOSIS — R1013 Epigastric pain: Secondary | ICD-10-CM | POA: Diagnosis not present

## 2016-02-13 DIAGNOSIS — Z791 Long term (current) use of non-steroidal anti-inflammatories (NSAID): Secondary | ICD-10-CM | POA: Insufficient documentation

## 2016-02-13 DIAGNOSIS — K838 Other specified diseases of biliary tract: Secondary | ICD-10-CM | POA: Diagnosis not present

## 2016-02-13 DIAGNOSIS — Z1211 Encounter for screening for malignant neoplasm of colon: Secondary | ICD-10-CM | POA: Diagnosis not present

## 2016-02-13 MED ORDER — NA SULFATE-K SULFATE-MG SULF 17.5-3.13-1.6 GM/177ML PO SOLN: 1.0000 | Freq: Once | ORAL | 0 refills | Status: AC

## 2016-02-13 NOTE — Patient Instructions (Addendum)
You have been scheduled for an MRI/MRCP at [facility] on 03/07/2016. Your appointment time is 8:45am. Please arrive 15 minutes prior to your appointment time for registration purposes. Please make certain not to have anything to eat or drink 6 hours prior to your test. In addition, if you have any metal in your body, have a pacemaker or defibrillator, please be sure to let your ordering physician know. This test typically takes 45 minutes to 1 hour to complete.  Bring the Xanax with you to your test    You have been scheduled for a colonoscopy. Please follow written instructions given to you at your visit today.  Please pick up your prep supplies at the pharmacy within the next 1-3 days. If you use inhalers (even only as needed), please bring them with you on the day of your procedure. Your physician has requested that you go to www.startemmi.com and enter the access code given to you at your visit today. This web site gives a general overview about your procedure. However, you should still follow specific instructions given to you by our office regarding your preparation for the procedure.  Restart Prilosec OTC daily

## 2016-02-13 NOTE — Progress Notes (Addendum)
02/13/2016 Karen Spears [id number] October 20, 1955   HISTORY OF PRESENT ILLNESS:  This is a pleasant 60 year old female who is new to our practice. She has been referred here by her PCP, Dr. Regis Bill, for evaluation of abdominal pain.  She tells me that previously she had some epigastric and right upper quadrant abdominal pain about 3 years ago. She started taking Prilosec over-the-counter for that and it seemed to help. Then over the past 3 months she has stopped taking the medication and now she has had return of her pain.  She has not yet restarted the Prilosec. CBC, CMP, and sedimentation rate were within normal limits. She had an ultrasound performed by her PCP that showed fatty liver and also mild intrahepatic biliary dilatation could not be excluded, although common bile duct was normal in caliber without evidence of gallstones. MRI of the abdomen and MRCP was recommended.  She describes the pain as a constant discomfort, but sometimes more intense and almost quite severe.  She's never undergone colonoscopy in the past. In early September she had a acute onset of diarrhea that lasted for a couple weeks and she even had some incontinence associated with that. Stool studies performed by her PCP were unremarkable. She tells me that now her bowel movements are about back to normal.  Past Medical History:  Diagnosis Date  . Allergic rhinitis   . Asthmatic bronchitis 07/30/2012  . Bladder polyps    and stones dr Amalia Hailey   . Bronchospasm 08/20/2012   Much improved on steroid inhaler and removal from old the house. At this time hold on specialty referral consider PFTs in the future maintain on Symbicort for n   . Drug rash 05/13/2011   ? Fixed rash  From diflucan  ?   Marland Kitchen Exposure to mold 07/30/2012  . 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 Headache(784.0)   . MIGRAINE HEADACHE 03/29/2009   Qualifier: Diagnosis of  By: Regis Bill MD, Standley Brooking    . Migraines   . Reaction to severe stress 12/31/2011   Past Surgical History:  Procedure Laterality Date  . ABDOMINAL HYSTERECTOMY    . vocal cord surgery  1987    reports that she has never smoked. She does not have any smokeless tobacco history on file. She reports that she does not drink alcohol or use drugs. family history includes Gallstones in her mother and sister. Allergies  Allergen Reactions  . Azithromycin   . Cefdinir     REACTION: ? if headache  see Nov 9th Visit  . Fexofenadine   . Fluconazole     REACTION: Raw spot under nose and chin  . Penicillins   . Terbinafine And Related Other (See Comments)    Burning  Nose  Raw        Outpatient Encounter Prescriptions as of 02/13/2016  Medication Sig  . ALPRAZolam (XANAX) 0.5 MG tablet TAKE 1 TABLET BY MOUTH TWICE DAILY. MAY TAKE 1 AND 1/2 TABLETS AT BEDTIME AS NEEDED.  Marland Kitchen estradiol (VIVELLE-DOT) 0.1 MG/24HR patch PLACE 1 PATCH ONTO THE SKIN TWICE WEEKLY  . ibuprofen (ADVIL,MOTRIN) 800 MG tablet TAKE 1 TABLET BY MOUTH TWICE DAILY AS NEEDED FOR PAIN.  . metoprolol succinate (TOPROL-XL) 25 MG 24 hr tablet TAKE 1 TABLET(25 MG) BY MOUTH DAILY  . lidocaine (LIDODERM) 5 % Place 1 patch onto the skin daily. Remove & Discard patch within 12 hours or as  directed by MD   No facility-administered encounter medications on file as of 02/13/2016.      REVIEW OF SYSTEMS  : All other systems reviewed and negative except where noted in the History of Present Illness.   PHYSICAL EXAM: BP 130/80   Ht 5\' 4"  (1.626 m)   Wt 142 lb 14.4 oz (64.8 kg)   BMI 24.53 kg/m  General: Well developed white female in no acute distress Head: Normocephalic and atraumatic Eyes:  Sclerae anicteric, conjunctiva pink. Ears: Normal auditory acuity Lungs: Clear throughout to auscultation Heart: Regular rate and rhythm Abdomen: Soft, non-distended.  Non-distended.  BS present.  Non-tender. Rectal:  Will be done at the time of  colonoscopy. Musculoskeletal: Symmetrical with no gross deformities  Skin: No lesions on visible extremities Extremities: No edema  Neurological: Alert oriented x 4, grossly non-focal Psychological:  Alert and cooperative. Normal mood and affect  ASSESSMENT AND PLAN: -Screening colonoscopy:  Will schedule with Dr. Hilarie Fredrickson.  The risks, benefits, and alternatives to colonoscopy were discussed with the patient and she consents to proceed.  -Epigastric and RUQ abdominal pain:  Has history of similar pain for which she took Prilosec with good relief. Has now been off of Prilosec for the past few months and pain has returned. Question if this is acid related. She does also have possible mild intrahepatic biliary dilatation on ultrasound.  LFTs are normal, but in light of her pain we will proceed with the recommended MRCP. She will restart her omeprazole OTC to see if this again helps with her pain.  CC:  Panosh, Standley Brooking, MD  Addendum: Reviewed and agree with initial management. Jerene Bears, MD

## 2016-02-28 ENCOUNTER — Encounter: Payer: Self-pay | Admitting: Internal Medicine

## 2016-03-02 ENCOUNTER — Other Ambulatory Visit: Payer: Self-pay | Admitting: Internal Medicine

## 2016-03-04 ENCOUNTER — Other Ambulatory Visit: Payer: Self-pay | Admitting: Family Medicine

## 2016-03-04 NOTE — Telephone Encounter (Signed)
Last OV 02-13-2016 Last refill 01-10-2016 #60, 0rf Please advise

## 2016-03-04 NOTE — Telephone Encounter (Signed)
Ok to refill x 1  

## 2016-03-04 NOTE — Telephone Encounter (Signed)
Pt need new Rx for estradiol patch and ibuprofen    Pharm:  [facility] [address] [facility] [address]

## 2016-03-05 NOTE — Telephone Encounter (Signed)
Ok ibuprofen x 3   Estradiol patch x 6 months  Have her get on schedule for cpx before  runs out in 6 months

## 2016-03-05 NOTE — Telephone Encounter (Signed)
No cpx since 03/22/2014.  Has had recent labs. Please advise.

## 2016-03-05 NOTE — Telephone Encounter (Signed)
Called to the pharmacy and left on machine. 

## 2016-03-06 ENCOUNTER — Encounter: Payer: Self-pay | Admitting: Internal Medicine

## 2016-03-06 ENCOUNTER — Ambulatory Visit (AMBULATORY_SURGERY_CENTER): Payer: PRIVATE HEALTH INSURANCE | Admitting: Internal Medicine

## 2016-03-06 VITALS — BP 162/81 | HR 62 | Temp 97.8°F | Resp 10 | Ht 61.5 in | Wt 135.0 lb

## 2016-03-06 DIAGNOSIS — Z1212 Encounter for screening for malignant neoplasm of rectum: Secondary | ICD-10-CM

## 2016-03-06 DIAGNOSIS — Z1211 Encounter for screening for malignant neoplasm of colon: Secondary | ICD-10-CM | POA: Diagnosis present

## 2016-03-06 MED ORDER — SODIUM CHLORIDE 0.9 % IV SOLN: 500.0000 mL | INTRAVENOUS | Status: DC

## 2016-03-06 NOTE — Op Note (Signed)
[place] Patient Name: Karen Spears Procedure Date: 03/06/2016 9:30 AM MRN: [id number] Endoscopist: Jerene Bears , MD Age: 60 Referring MD:  Date of Birth: 09-Aug-1955 Gender: Female Account #: [id number] Procedure:                Colonoscopy Indications:              Screening for colorectal malignant neoplasm, This                            is the patient's first colonoscopy Medicines:                Monitored Anesthesia Care Procedure:                Pre-Anesthesia Assessment:                           - Prior to the procedure, a History and Physical                            was performed, and patient medications and                            allergies were reviewed. The patient's tolerance of                            previous anesthesia was also reviewed. The risks                            and benefits of the procedure and the sedation                            options and risks were discussed with the patient.                            All questions were answered, and informed consent                            was obtained. Prior Anticoagulants: The patient has                            taken no previous anticoagulant or antiplatelet                            agents. ASA Grade Assessment: II - A patient with                            mild systemic disease. After reviewing the risks                            and benefits, the patient was deemed in                            satisfactory condition to undergo the procedure.  After obtaining informed consent, the colonoscope                            was passed under direct vision. Throughout the                            procedure, the patient's blood pressure, pulse, and                            oxygen saturations were monitored continuously. The                            Model PCF-[id number]) scope was introduced                            through the anus and  advanced to the the cecum,                            identified by appendiceal orifice and ileocecal                            valve. The colonoscopy was performed without                            difficulty. The patient tolerated the procedure                            well. The quality of the bowel preparation was                            good. The ileocecal valve, appendiceal orifice, and                            rectum were photographed. Scope In: 9:36:00 AM Scope Out: 9:47:35 AM Scope Withdrawal Time: 0 hours 8 minutes 25 seconds  Total Procedure Duration: 0 hours 11 minutes 35 seconds  Findings:                 The perianal and digital rectal examinations were                            normal.                           The entire examined colon appeared normal on direct                            and retroflexion views. Complications:            No immediate complications. Estimated Blood Loss:     Estimated blood loss: none. Impression:               - The entire examined colon is normal on direct and                            retroflexion views.                           -  No specimens collected. Recommendation:           - Patient has a contact number available for                            emergencies. The signs and symptoms of potential                            delayed complications were discussed with the                            patient. Return to normal activities tomorrow.                            Written discharge instructions were provided to the                            patient.                           - Resume previous diet.                           - Continue present medications.                           - Await pathology results.                           - Repeat colonoscopy in 10 years for screening                            purposes. Jerene Bears, MD 03/06/2016 9:50:22 AM This report has been signed electronically.

## 2016-03-06 NOTE — Patient Instructions (Signed)
YOU HAD AN ENDOSCOPIC PROCEDURE TODAY AT THE [facility]:   Refer to the procedure report that was given to you for any specific questions about what was found during the examination.  If the procedure report does not answer your questions, please call your gastroenterologist to clarify.  If you requested that your care partner not be given the details of your procedure findings, then the procedure report has been included in a sealed envelope for you to review at your convenience later.  YOU SHOULD EXPECT: Some feelings of bloating in the abdomen. Passage of more gas than usual.  Walking can help get rid of the air that was put into your GI tract during the procedure and reduce the bloating. If you had a lower endoscopy (such as a colonoscopy or flexible sigmoidoscopy) you may notice spotting of blood in your stool or on the toilet paper. If you underwent a bowel prep for your procedure, you may not have a normal bowel movement for a few days.  Please Note:  You might notice some irritation and congestion in your nose or some drainage.  This is from the oxygen used during your procedure.  There is no need for concern and it should clear up in a day or so.  SYMPTOMS TO REPORT IMMEDIATELY:   Following lower endoscopy (colonoscopy or flexible sigmoidoscopy):  Excessive amounts of blood in the stool  Significant tenderness or worsening of abdominal pains  Swelling of the abdomen that is new, acute  Fever of 100F or higher  For urgent or emergent issues, a gastroenterologist can be reached at any hour by calling [phone].   DIET:  We do recommend a small meal at first, but then you may proceed to your regular diet.  Drink plenty of fluids but you should avoid alcoholic beverages for 24 hours.  ACTIVITY:  You should plan to take it easy for the rest of today and you should NOT DRIVE or use heavy machinery until tomorrow (because of the sedation medicines used during the test).     FOLLOW UP: Our staff will call the number listed on your records the next business day following your procedure to check on you and address any questions or concerns that you may have regarding the information given to you following your procedure. If we do not reach you, we will leave a message.  However, if you are feeling well and you are not experiencing any problems, there is no need to return our call.  We will assume that you have returned to your regular daily activities without incident.  If any biopsies were taken you will be contacted by phone or by letter within the next 1-3 weeks.  Please call us at [phone] if you have not heard about the biopsies in 3 weeks.    SIGNATURES/CONFIDENTIALITY: You and/or your care partner have signed paperwork which will be entered into your electronic medical record.  These signatures attest to the fact that that the information above on your After Visit Summary has been reviewed and is understood.  Full responsibility of the confidentiality of this discharge information lies with you and/or your care-partner. 

## 2016-03-06 NOTE — Progress Notes (Signed)
A and O x3. Report to RN. Tolerated MAC anesthesia well. 

## 2016-03-07 ENCOUNTER — Telehealth: Payer: Self-pay | Admitting: Internal Medicine

## 2016-03-07 ENCOUNTER — Telehealth: Payer: Self-pay

## 2016-03-07 ENCOUNTER — Other Ambulatory Visit: Payer: Self-pay | Admitting: Gastroenterology

## 2016-03-07 ENCOUNTER — Ambulatory Visit (HOSPITAL_COMMUNITY)
Admission: RE | Admit: 2016-03-07 | Discharge: 2016-03-07 | Disposition: A | Discharge status: Home / Self Care | Payer: PRIVATE HEALTH INSURANCE | Source: Ambulatory Visit | Attending: Gastroenterology | Admitting: Gastroenterology

## 2016-03-07 DIAGNOSIS — R1011 Right upper quadrant pain: Secondary | ICD-10-CM

## 2016-03-07 DIAGNOSIS — K838 Other specified diseases of biliary tract: Secondary | ICD-10-CM | POA: Diagnosis not present

## 2016-03-07 DIAGNOSIS — R1013 Epigastric pain: Secondary | ICD-10-CM

## 2016-03-07 DIAGNOSIS — N281 Cyst of kidney, acquired: Secondary | ICD-10-CM | POA: Diagnosis not present

## 2016-03-07 MED ORDER — IBUPROFEN 800 MG PO TABS: 800.0000 mg | ORAL_TABLET | Freq: Two times a day (BID) | ORAL | 2 refills | Status: DC | PRN

## 2016-03-07 MED ORDER — GADOBENATE DIMEGLUMINE 529 MG/ML IV SOLN
12.0000 mL | Freq: Once | INTRAVENOUS | Status: AC | PRN
  Administered 2016-03-07: 12 mL via INTRAVENOUS

## 2016-03-07 NOTE — Telephone Encounter (Signed)
Misty I already answered this with other refills for patch please send in if not already done

## 2016-03-07 NOTE — Telephone Encounter (Signed)
Pharmacy called for pt to request a refill of  ibuprofen (ADVIL,MOTRIN) 800 MG tablet  [facility]

## 2016-03-07 NOTE — Telephone Encounter (Signed)
  Follow up Call-  Call back number 03/06/2016  Post procedure Call Back phone  # [phone]  Permission to leave phone message Yes  Some recent data might be hidden     Patient questions:  Do you have a fever, pain , or abdominal swelling? No. Pain Score  0 *  Have you tolerated food without any problems? Yes.    Have you been able to return to your normal activities? Yes.    Do you have any questions about your discharge instructions: Diet   No. Medications  No. Follow up visit  No.  Do you have questions or concerns about your Care? No.  Actions: * If pain score is 4 or above: No action needed, pain <4.

## 2016-03-07 NOTE — Telephone Encounter (Signed)
Sent to the pharmacy by e-scribe. 

## 2016-03-17 ENCOUNTER — Other Ambulatory Visit: Payer: Self-pay | Admitting: Internal Medicine

## 2016-03-18 NOTE — Telephone Encounter (Signed)
Ok to refill x 6 months   Will need ov for med check or preventive visit

## 2016-03-19 ENCOUNTER — Other Ambulatory Visit: Payer: Self-pay | Admitting: Family Medicine

## 2016-03-19 ENCOUNTER — Telehealth: Payer: Self-pay | Admitting: Family Medicine

## 2016-03-19 DIAGNOSIS — Z0000 Encounter for general adult medical examination without abnormal findings: Secondary | ICD-10-CM

## 2016-03-19 NOTE — Telephone Encounter (Signed)
Sent to the pharmacy by e-scribe per WP's instructions.  Message sent to scheduling.

## 2016-03-19 NOTE — Telephone Encounter (Signed)
Pt has been schedule.

## 2016-03-19 NOTE — Telephone Encounter (Signed)
Pt past due for cpx and lab work.  I have placed the lab orders.  Please help the pt to make both appointments.  Thanks!!

## 2016-03-24 ENCOUNTER — Ambulatory Visit (INDEPENDENT_AMBULATORY_CARE_PROVIDER_SITE_OTHER): Payer: PRIVATE HEALTH INSURANCE | Admitting: Internal Medicine

## 2016-03-24 DIAGNOSIS — Z23 Encounter for immunization: Secondary | ICD-10-CM | POA: Diagnosis not present

## 2016-04-09 ENCOUNTER — Encounter: Payer: Self-pay | Admitting: Internal Medicine

## 2016-04-09 ENCOUNTER — Other Ambulatory Visit: Payer: Self-pay | Admitting: Internal Medicine

## 2016-04-12 NOTE — Telephone Encounter (Signed)
Ok to refill x 1  

## 2016-04-14 ENCOUNTER — Other Ambulatory Visit: Payer: Self-pay | Admitting: Internal Medicine

## 2016-04-14 MED ORDER — ALPRAZOLAM 0.5 MG PO TABS: ORAL_TABLET | ORAL | 0 refills | Status: DC

## 2016-04-14 NOTE — Telephone Encounter (Signed)
Called to the pharmacy and left on machine. 

## 2016-04-14 NOTE — Telephone Encounter (Signed)
Declined.  Duplicate message.  Sent a message to the pharmacy informing them that I called this in.

## 2016-04-17 ENCOUNTER — Encounter: Payer: Self-pay | Admitting: Internal Medicine

## 2016-04-17 MED ORDER — PROMETHAZINE HCL 25 MG RE SUPP: 25.0000 mg | Freq: Four times a day (QID) | RECTAL | 0 refills | Status: DC | PRN

## 2016-04-17 MED ORDER — KETOPROFEN 50 MG PO CAPS: 50.0000 mg | ORAL_CAPSULE | Freq: Four times a day (QID) | ORAL | 0 refills | Status: DC | PRN

## 2016-04-17 MED ORDER — RIZATRIPTAN BENZOATE 10 MG PO TABS: 10.0000 mg | ORAL_TABLET | ORAL | 0 refills | Status: DC | PRN

## 2016-04-21 ENCOUNTER — Other Ambulatory Visit: Payer: Self-pay | Admitting: Internal Medicine

## 2016-04-22 NOTE — Telephone Encounter (Signed)
Duplicate message.  Filled on 04/17/16 for #30 per [facility].  Message sent to the pharmacy to check file.  This request was denied.

## 2016-05-14 NOTE — Progress Notes (Signed)
Pre visit review using our clinic review tool, if applicable. No additional management support is needed unless otherwise documented below in the visit note.  Chief Complaint  Patient presents with  . Cough    X2weeks  . Nasal Congestion  . Ear Pain  . Scratchy Throat  . Sinus Pressure  . Generalized Body Aches    HPI: Karen Spears 61 y.o.  sda  3 days pre x mas  Clear uri drip and airs nasal saline and robitussin.   Then after x mas  Sever food poisoning acute vomiting diarrhea . Had taken some cold medicine and one leftover Omnicef in addition to a different female an hour later felt hot and swollen and vomiting diarrhea and then got better.   Then got worse . feels bad all over sinus ears and throat and coughing .     Green phelgm .   Although can breathe through her nose and she is probably having low-grade fevers. Some cough no shortness of breath.     Needs to visit people in the hospital but she has been sick. bp  Med change    From pharmacy backlog   Atenolol was better she hasn't been checking her blood pressure readings wonders if it's up because of the medicine. She has had some migraines in the last 6 months. ROS: See pertinent positives and negatives per HPI.  Past Medical History:  Diagnosis Date  . Allergic rhinitis   . Allergy   . Anxiety   . Arthritis    hips  . Asthmatic bronchitis 07/30/2012  . Bladder polyps    and stones dr Amalia Hailey   . Bronchospasm 08/20/2012   Much improved on steroid inhaler and removal from old the house. At this time hold on specialty referral consider PFTs in the future maintain on Symbicort for n   . Chronic hip pain    bursitis and arthritis  . Drug rash 05/13/2011   ? Fixed rash  From diflucan  ?   Marland Kitchen Exposure to mold 07/30/2012  . 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 GERD (gastroesophageal reflux disease)    prilosec prn only  . Headache(784.0)   . Hypertension   .  MIGRAINE HEADACHE 03/29/2009   Qualifier: Diagnosis of  By: Regis Bill MD, Standley Brooking   . Migraines   . Nerve damage    pelvic floor  . Neuromuscular disorder (HCC)    hips, pelvic floor   . Reaction to severe stress 12/31/2011    Family History  Problem Relation Age of Onset  . Gallstones Mother   . Colon polyps Mother   . Gallstones Sister   . Bipolar disorder      child  . Colon polyps Father   . Colon cancer Neg Hx   . Esophageal cancer Neg Hx   . Rectal cancer Neg Hx   . Stomach cancer Neg Hx     Social History   Social History  . Marital status: Married    Spouse name: N/A  . Number of children: N/A  . Years of education: N/A   Social History Main Topics  . Smoking status: Never Smoker  . Smokeless tobacco: Never Used  . Alcohol use No  . Drug use: No  . Sexual activity: Not Asked   Other Topics Concern  . 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 ALPRAZolam (XANAX) 0.5 MG tablet TAKE 1 TABLET BY MOUTH TWICE DAILY MAY TAKE 1& 1/2 TABLETS AT BEDTIME IF NEEDED 60 tablet 0  . estradiol (VIVELLE-DOT) 0.1 MG/24HR patch PLACE 1 PATCH ONTO THE SKIN TWICE WEEKLY 24 patch 0  . ibuprofen (ADVIL,MOTRIN) 800 MG tablet Take 1 tablet (800 mg total) by mouth 2 (two) times daily as needed. for pain 60 tablet 2  . ketoprofen (ORUDIS) 50 MG capsule Take 1 capsule (50 mg total) by mouth 4 (four) times daily as needed. 30 capsule 0  . metoprolol succinate (TOPROL-XL) 25 MG 24 hr tablet TAKE 1 TABLET(25 MG) BY MOUTH DAILY 30 tablet 5  . rizatriptan (MAXALT) 10 MG tablet Take 1 tablet (10 mg total) by mouth as needed. May repeat in 2 hours if needed 10 tablet 0  . ALPRAZolam (XANAX) 0.5  MG tablet TAKE 1 TABLET BY MOUTH TWICE DAILY. MAY TAKE 1 AND 1/2 TABLETS AT BEDTIME AS NEEDED. 60 tablet 0  . promethazine (PHENERGAN) 25 MG suppository Place 1 suppository (25 mg total) rectally every 6 (six) hours as needed for nausea. 6 each 0  . 0.9 %  sodium chloride infusion      No facility-administered medications prior to visit.      EXAM:  BP (!) 160/88 (BP Location: Right Arm, Patient Position: Sitting, Cuff Size: Normal)   Temp 97.9 F (36.6 C) (Oral)   Wt 143 lb 11.2 oz (65.2 kg)   BMI 26.71 kg/m   Body mass index is 26.71 kg/m. WDWN in NAD  quiet respirations; mildly congested  somewhat hoarse. Non toxic .Looks congested and mildly ill. HEENT: Normocephalic ;atraumatic , Eyes;  PERRL, EOMs  Full, lids and conjunctiva clear,,Ears: no deformities, canals nl, TM landmarks normal, Nose: no deformity or discharge but congested;face minimally tender Mouth : OP clear without lesion or edema . Neck: Supple without adenopathy or masses or bruits Chest:  Clear to A&P without wheezes rales or rhonchi Abdomen soft without organomegaly guarding or rebound CV:  S1-S2 no gallops or murmurs peripheral perfusion is normal Skin :nl perfusion and no acute rashes   ASSESSMENT AND PLAN:  Discussed the following assessment and plan:  Protracted URI  Acute sinusitis, recurrence not specified, unspecified location  Medication management - Refill alprazolam last refill got sent to the wrong pharmacy.  Elevated blood pressure reading - Could be up today ROM illness get some reading send them in mind chart before changing medication. Uncertain if the episode of healing hot red nausea and vomiting was an allergic reaction to medication or acute food poisoning etc. but she has not had recurrence. At this time will use doxycycline even though causes some mild symptoms. Uncertain if she is allergic to [facility]. -Patient advised to return or notify health care team  if symptoms worsen ,persist or  new concerns arise.  Patient Instructions   Can add antiboitic for sinusitis   Take with food on stomoach .  Take blood pressure readings twice a day for 7- 10 days   To decide on bp med  And any changes    Readings can be up cause you are sick    .  Send in readings     My chart  .  And then can decide on treatment .        Standley Brooking. Prudy Candy M.D.

## 2016-05-15 ENCOUNTER — Ambulatory Visit (INDEPENDENT_AMBULATORY_CARE_PROVIDER_SITE_OTHER): Payer: PRIVATE HEALTH INSURANCE | Admitting: Internal Medicine

## 2016-05-15 ENCOUNTER — Encounter: Payer: Self-pay | Admitting: Internal Medicine

## 2016-05-15 VITALS — BP 160/88 | Temp 97.9°F | Wt 143.7 lb

## 2016-05-15 DIAGNOSIS — J019 Acute sinusitis, unspecified: Secondary | ICD-10-CM | POA: Diagnosis not present

## 2016-05-15 DIAGNOSIS — J069 Acute upper respiratory infection, unspecified: Secondary | ICD-10-CM

## 2016-05-15 DIAGNOSIS — R030 Elevated blood-pressure reading, without diagnosis of hypertension: Secondary | ICD-10-CM | POA: Diagnosis not present

## 2016-05-15 DIAGNOSIS — Z79899 Other long term (current) drug therapy: Secondary | ICD-10-CM | POA: Diagnosis not present

## 2016-05-15 MED ORDER — ALPRAZOLAM 0.5 MG PO TABS: ORAL_TABLET | ORAL | 0 refills | Status: DC

## 2016-05-15 MED ORDER — DOXYCYCLINE HYCLATE 100 MG PO TABS: 100.0000 mg | ORAL_TABLET | Freq: Two times a day (BID) | ORAL | 0 refills | Status: DC

## 2016-05-15 NOTE — Patient Instructions (Addendum)
  Can add antiboitic for sinusitis   Take with food on stomoach .  Take blood pressure readings twice a day for 7- 10 days   To decide on bp med  And any changes    Readings can be up cause you are sick    .Send in readings     My chart  .  And then can decide on treatment .

## 2016-05-22 ENCOUNTER — Other Ambulatory Visit: Payer: Self-pay | Admitting: Internal Medicine

## 2016-05-23 MED ORDER — PREDNISONE 20 MG PO TABS: ORAL_TABLET | ORAL | 0 refills | Status: DC

## 2016-05-23 MED ORDER — BUDESONIDE-FORMOTEROL FUMARATE 160-4.5 MCG/ACT IN AERO: 2.0000 | INHALATION_SPRAY | Freq: Two times a day (BID) | RESPIRATORY_TRACT | 3 refills | Status: DC

## 2016-05-23 MED ORDER — LEVOFLOXACIN 500 MG PO TABS: 500.0000 mg | ORAL_TABLET | Freq: Every day | ORAL | 0 refills | Status: DC

## 2016-06-19 ENCOUNTER — Encounter: Payer: Self-pay | Admitting: Internal Medicine

## 2016-06-20 ENCOUNTER — Encounter: Payer: Self-pay | Admitting: Internal Medicine

## 2016-06-20 ENCOUNTER — Ambulatory Visit (INDEPENDENT_AMBULATORY_CARE_PROVIDER_SITE_OTHER): Payer: PRIVATE HEALTH INSURANCE | Admitting: Internal Medicine

## 2016-06-20 VITALS — BP 140/82 | Temp 98.4°F | Wt 141.0 lb

## 2016-06-20 DIAGNOSIS — J101 Influenza due to other identified influenza virus with other respiratory manifestations: Secondary | ICD-10-CM

## 2016-06-20 DIAGNOSIS — J029 Acute pharyngitis, unspecified: Secondary | ICD-10-CM

## 2016-06-20 DIAGNOSIS — R6889 Other general symptoms and signs: Secondary | ICD-10-CM

## 2016-06-20 DIAGNOSIS — R509 Fever, unspecified: Secondary | ICD-10-CM | POA: Diagnosis not present

## 2016-06-20 DIAGNOSIS — J9801 Acute bronchospasm: Secondary | ICD-10-CM

## 2016-06-20 DIAGNOSIS — H9190 Unspecified hearing loss, unspecified ear: Secondary | ICD-10-CM

## 2016-06-20 LAB — POCT INFLUENZA A/B
INFLUENZA B, POC: NEGATIVE
Influenza A, POC: POSITIVE — AB

## 2016-06-20 LAB — POCT RAPID STREP A (OFFICE): RAPID STREP A SCREEN: NEGATIVE

## 2016-06-20 MED ORDER — ALBUTEROL SULFATE (2.5 MG/3ML) 0.083% IN NEBU
2.5000 mg | INHALATION_SOLUTION | Freq: Once | RESPIRATORY_TRACT | Status: AC
  Administered 2016-06-20: 2.5 mg via RESPIRATORY_TRACT

## 2016-06-20 MED ORDER — ALBUTEROL SULFATE HFA 108 (90 BASE) MCG/ACT IN AERS: 1.0000 | INHALATION_SPRAY | Freq: Four times a day (QID) | RESPIRATORY_TRACT | 2 refills | Status: DC | PRN

## 2016-06-20 MED ORDER — OSELTAMIVIR PHOSPHATE 75 MG PO CAPS: 75.0000 mg | ORAL_CAPSULE | Freq: Two times a day (BID) | ORAL | 0 refills | Status: DC

## 2016-06-20 NOTE — Patient Instructions (Addendum)
Your flu test is positive for group a flu your symptoms today are consistent with this. Your rapid strep is negative. Tamiflu might be helpful to decrease in activity and decrease the length of fever of your illness. Should be used in the first 24-48 hours to be helpful. Begin Tamiflu right away if however you feel you were getting side effects from it you can stop. You can use the nebulizer every 6 hours if needed but it may not work as well because of the flu. If you're getting more short of breath dehydrated serious pain contact emergency service for advice.   If hearing an issue after better call fo referral   To see ent if not getting better  Your ear exam  look normal today  But congestion could be causing  Temporary decrease hearing    Influenza, Adult Influenza, more commonly known as "the flu," is a viral infection that primarily affects the respiratory tract. The respiratory tract includes organs that help you breathe, such as the lungs, nose, and throat. The flu causes many common cold symptoms, as well as a high fever and body aches. The flu spreads easily from person to person (is contagious). Getting a flu shot (influenza vaccination) every year is the best way to prevent influenza. What are the causes? Influenza is caused by a virus. You can catch the virus by:  Breathing in droplets from an infected person's cough or sneeze.  Touching something that was recently contaminated with the virus and then touching your mouth, nose, or eyes. What increases the risk? The following factors may make you more likely to get the flu:  Not cleaning your hands frequently with soap and water or alcohol-based hand sanitizer.  Having close contact with many people during cold and flu season.  Touching your mouth, eyes, or nose without washing or sanitizing your hands first.  Not drinking enough fluids or not eating a healthy diet.  Not getting enough sleep or exercise.  Being under a  high amount of stress.  Not getting a yearly (annual) flu shot. You may be at a higher risk of complications from the flu, such as a severe lung infection (pneumonia), if you:  Are over the age of 53.  Are pregnant.  Have a weakened disease-fighting system (immune system). You may have a weakened immune system if you:  Have HIV or AIDS.  Are undergoing chemotherapy.  Aretaking medicines that reduce the activity of (suppress) the immune system.  Have a long-term (chronic) illness, such as heart disease, kidney disease, diabetes, or lung disease.  Have a liver disorder.  Are obese.  Have anemia. What are the signs or symptoms? Symptoms of this condition typically last 4-10 days and may include:  Fever.  Chills.  Headache, body aches, or muscle aches.  Sore throat.  Cough.  Runny or congested nose.  Chest discomfort and cough.  Poor appetite.  Weakness or tiredness (fatigue).  Dizziness.  Nausea or vomiting. How is this diagnosed? This condition may be diagnosed based on your medical history and a physical exam. Your health care provider may do a nose or throat swab test to confirm the diagnosis. How is this treated? If influenza is detected early, you can be treated with antiviral medicine that can reduce the length of your illness and the severity of your symptoms. This medicine may be given by mouth (orally) or through an IV tube that is inserted in one of your veins. The goal of treatment is to  relieve symptoms by taking care of yourself at home. This may include taking over-the-counter medicines, drinking plenty of fluids, and adding humidity to the air in your home. In some cases, influenza goes away on its own. Severe influenza or complications from influenza may be treated in a hospital. Follow these instructions at home:  Take over-the-counter and prescription medicines only as told by your health care provider.  Use a cool mist humidifier to add  humidity to the air in your home. This can make breathing easier.  Rest as needed.  Drink enough fluid to keep your urine clear or pale yellow.  Cover your mouth and nose when you cough or sneeze.  Wash your hands with soap and water often, especially after you cough or sneeze. If soap and water are not available, use hand sanitizer.  Stay home from work or school as told by your health care provider. Unless you are visiting your health care provider, try to avoid leaving home until your fever has been gone for 24 hours without the use of medicine.  Keep all follow-up visits as told by your health care provider. This is important. How is this prevented?  Getting an annual flu shot is the best way to avoid getting the flu. You may get the flu shot in late summer, fall, or winter. Ask your health care provider when you should get your flu shot.  Wash your hands often or use hand sanitizer often.  Avoid contact with people who are sick during cold and flu season.  Eat a healthy diet, drink plenty of fluids, get enough sleep, and exercise regularly. Contact a health care provider if:  You develop new symptoms.  You have:  Chest pain.  Diarrhea.  A fever.  Your cough gets worse.  You produce more mucus.  You feel nauseous or you vomit. Get help right away if:  You develop shortness of breath or difficulty breathing.  Your skin or nails turn a bluish color.  You have severe pain or stiffness in your neck.  You develop a sudden headache or sudden pain in your face or ear.  You cannot stop vomiting. This information is not intended to replace advice given to you by your health care provider. Make sure you discuss any questions you have with your health care provider. Document Released: 04/25/2000 Document Revised: 10/04/2015 Document Reviewed: 02/20/2015 Elsevier Interactive Patient Education  2017 [employer].

## 2016-06-20 NOTE — Telephone Encounter (Signed)
I was out of the office yesterday pm  I just saw the patient this afternoon Not sure why I got this message is a timely and relevant

## 2016-06-20 NOTE — Progress Notes (Signed)
Pre visit review using our clinic review tool, if applicable. No additional management support is needed unless otherwise documented below in the visit note.  Chief Complaint  Patient presents with  . Trouble Hearing  . Wheezing  . Fever  . Sore Throat  . Generalized Body Aches  . Nasal Congestion    HPI: Karen Spears 61 y.o.  sda Onset  One day fever  And now sob. Has a nebulizer but hasn't used it in 2 years. Thinks she is wheezing. No hemoptysis but fever and chills Rained on Tuesday and coudlnet  Breath   Exposed to flu at super bowl Sunday but also in the hospital with patient's) sugars. Inhaler .  No vomiting diarrhea hasn't been a reduction here well since she's been congested last month. No ear pain. Sinuses congested. Dry cough no hemoptysis mostly clear. ROS: See pertinent positives and negatives per HPI.  Past Medical History:  Diagnosis Date  . Allergic rhinitis   . Allergy   . Anxiety   . Arthritis    hips  . Asthmatic bronchitis 07/30/2012  . Bladder polyps    and stones dr Amalia Hailey   . Bronchospasm 08/20/2012   Much improved on steroid inhaler and removal from old the house. At this time hold on specialty referral consider PFTs in the future maintain on Symbicort for n   . Chronic hip pain    bursitis and arthritis  . Drug rash 05/13/2011   ? Fixed rash  From diflucan  ?   Marland Kitchen Exposure to mold 07/30/2012  . 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 GERD (gastroesophageal reflux disease)    prilosec prn only  . Headache(784.0)   . Hypertension   . MIGRAINE HEADACHE 03/29/2009   Qualifier: Diagnosis of  By: Regis Bill MD, Standley Brooking   . Migraines   . Nerve damage    pelvic floor  . Neuromuscular disorder (HCC)    hips, pelvic floor   . Reaction to severe stress 12/31/2011    Family History  Problem Relation Age of Onset  . Gallstones Mother   . Colon polyps Mother   . Gallstones Sister   . Bipolar  disorder      child  . Colon polyps Father   . Colon cancer Neg Hx   . Esophageal cancer Neg Hx   . Rectal cancer Neg Hx   . Stomach cancer Neg Hx     Social History   Social History  . Marital status: Married    Spouse name: N/A  . Number of children: N/A  . Years of education: N/A   Social History Main Topics  . Smoking status: Never Smoker  . Smokeless tobacco: Never Used  . Alcohol use No  . Drug use: No  . Sexual activity: Not Asked   Other Topics Concern  . 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 ALPRAZolam (XANAX) 0.5 MG tablet TAKE 1 TABLET BY MOUTH TWICE DAILY MAY TAKE 1& 1/2 TABLETS AT BEDTIME IF NEEDED 60 tablet 0  .  ALPRAZolam (XANAX) 0.5 MG tablet TAKE 1 TABLET BY MOUTH TWICE DAILY. MAY TAKE 1 AND 1/2 TABLETS AT BEDTIME AS NEEDED. 60 tablet 0  . budesonide-formoterol (SYMBICORT) 160-4.5 MCG/ACT inhaler Inhale 2 puffs into the lungs 2 (two) times daily. 1 Inhaler 3  . estradiol (VIVELLE-DOT) 0.1 MG/24HR patch PLACE 1 PATCH ONTO THE SKIN TWICE WEEKLY 24 patch 0  . ibuprofen (ADVIL,MOTRIN) 800 MG tablet Take 1 tablet (800 mg total) by mouth 2 (two) times daily as needed. for pain 60 tablet 2  . ketoprofen (ORUDIS) 50 MG capsule Take 1 capsule (50 mg total) by mouth 4 (four) times daily as needed. 30 capsule 0  . lidocaine (LIDODERM) 5 % Remove & Discard patch within 12 hours or as directed by MD    . metoprolol succinate (TOPROL-XL) 25 MG 24 hr tablet TAKE 1 TABLET(25 MG) BY MOUTH DAILY 30 tablet 5  . rizatriptan (MAXALT) 10 MG tablet Take 1 tablet (10 mg total) by mouth as needed. May repeat in 2 hours if needed 10 tablet 0  . promethazine (PHENERGAN) 25 MG  suppository Place 1 suppository (25 mg total) rectally every 6 (six) hours as needed for nausea. 6 each 0  . doxycycline (VIBRA-TABS) 100 MG tablet Take 1 tablet (100 mg total) by mouth 2 (two) times daily. 14 tablet 0  . levofloxacin (LEVAQUIN) 500 MG tablet Take 1 tablet (500 mg total) by mouth daily. 5 tablet 0  . predniSONE (DELTASONE) 20 MG tablet Take 1 tablet twice daily for five days 10 tablet 0   No facility-administered medications prior to visit.      EXAM:  BP 140/82 (BP Location: Right Arm, Patient Position: Sitting, Cuff Size: Normal)   Temp 98.4 F (36.9 C) (Oral)   Wt 141 lb (64 kg)   BMI 26.21 kg/m   Body mass index is 26.21 kg/m. WDWN in NAD  quiet respirations; mildly congested  somewhat hoarse. Non toxic . HEENT: Normocephalic ;atraumatic , Eyes;  PERRL, EOMs  Full, lids and conjunctiva clear,,Ears: no deformities, canals nl, TM landmarks normal, Nose: no deformity or discharge but congested;face minimally tender Mouth : OP clear without lesion or edema . Neck: Supple without adenopathy or masses or bruits Chest:  ClearA  but decreased air movement after nebulizer of albuterol increased breath sounds she states feels better no rales or rhonchi. CV:  S1-S2 no gallops or murmurs peripheral perfusion is normal Skin :nl perfusion and no acute rashes  Abdomen soft without organomegaly guarding or rebound negative CCE skin no acute rashes normal capillary refill. Point-of-care test positive for influenza A  ASSESSMENT AND PLAN:  Discussed the following assessment and plan:  Influenza A  Fever, unspecified fever cause - Plan: POC Influenza A/B, POC Rapid Strep A, Culture, Group A Strep, albuterol (PROVENTIL) (2.5 MG/3ML) 0.083% nebulizer solution 2.5 mg, CANCELED: Throat culture  Sore throat - Plan: POC Influenza A/B, POC Rapid Strep A, Culture, Group A Strep, CANCELED: Throat culture  Flu-like symptoms - Plan: albuterol (PROVENTIL) (2.5 MG/3ML) 0.083% nebulizer  solution 2.5 mg  Decreased hearing, unspecified laterality  Bronchospasm Has a history of bronchospasm under certain situations but no definite diagnosis of asthma. Pro-air inhaler given described Tamiflu risk-benefit gets too many side effects. She is a candidate at this time. Rest fluids expectant management and follow-up for alarm symptoms. She was also concerned about her husband who has a history of heart disease and exposure referred to Dr. Barbie Banner PCP for prophylactic Tamiflu -Patient advised to return or  notify health care team  if symptoms worsen ,persist or new concerns arise.  Patient Instructions  Your flu test is positive for group a flu your symptoms today are consistent with this. Your rapid strep is negative. Tamiflu might be helpful to decrease in activity and decrease the length of fever of your illness. Should be used in the first 24-48 hours to be helpful. Begin Tamiflu right away if however you feel you were getting side effects from it you can stop. You can use the nebulizer every 6 hours if needed but it may not work as well because of the flu. If you're getting more short of breath dehydrated serious pain contact emergency service for advice.   If hearing an issue after better call fo referral   To see ent if not getting better  Your ear exam  look normal today  But congestion could be causing  Temporary decrease hearing    Influenza, Adult Influenza, more commonly known as "the flu," is a viral infection that primarily affects the respiratory tract. The respiratory tract includes organs that help you breathe, such as the lungs, nose, and throat. The flu causes many common cold symptoms, as well as a high fever and body aches. The flu spreads easily from person to person (is contagious). Getting a flu shot (influenza vaccination) every year is the best way to prevent influenza. What are the causes? Influenza is caused by a virus. You can catch the virus by:  Breathing  in droplets from an infected person's cough or sneeze.  Touching something that was recently contaminated with the virus and then touching your mouth, nose, or eyes. What increases the risk? The following factors may make you more likely to get the flu:  Not cleaning your hands frequently with soap and water or alcohol-based hand sanitizer.  Having close contact with many people during cold and flu season.  Touching your mouth, eyes, or nose without washing or sanitizing your hands first.  Not drinking enough fluids or not eating a healthy diet.  Not getting enough sleep or exercise.  Being under a high amount of stress.  Not getting a yearly (annual) flu shot. You may be at a higher risk of complications from the flu, such as a severe lung infection (pneumonia), if you:  Are over the age of 40.  Are pregnant.  Have a weakened disease-fighting system (immune system). You may have a weakened immune system if you:  Have HIV or AIDS.  Are undergoing chemotherapy.  Aretaking medicines that reduce the activity of (suppress) the immune system.  Have a long-term (chronic) illness, such as heart disease, kidney disease, diabetes, or lung disease.  Have a liver disorder.  Are obese.  Have anemia. What are the signs or symptoms? Symptoms of this condition typically last 4-10 days and may include:  Fever.  Chills.  Headache, body aches, or muscle aches.  Sore throat.  Cough.  Runny or congested nose.  Chest discomfort and cough.  Poor appetite.  Weakness or tiredness (fatigue).  Dizziness.  Nausea or vomiting. How is this diagnosed? This condition may be diagnosed based on your medical history and a physical exam. Your health care provider may do a nose or throat swab test to confirm the diagnosis. How is this treated? If influenza is detected early, you can be treated with antiviral medicine that can reduce the length of your illness and the severity of your  symptoms. This medicine may be given by mouth (orally) or  through an IV tube that is inserted in one of your veins. The goal of treatment is to relieve symptoms by taking care of yourself at home. This may include taking over-the-counter medicines, drinking plenty of fluids, and adding humidity to the air in your home. In some cases, influenza goes away on its own. Severe influenza or complications from influenza may be treated in a hospital. Follow these instructions at home:  Take over-the-counter and prescription medicines only as told by your health care provider.  Use a cool mist humidifier to add humidity to the air in your home. This can make breathing easier.  Rest as needed.  Drink enough fluid to keep your urine clear or pale yellow.  Cover your mouth and nose when you cough or sneeze.  Wash your hands with soap and water often, especially after you cough or sneeze. If soap and water are not available, use hand sanitizer.  Stay home from work or school as told by your health care provider. Unless you are visiting your health care provider, try to avoid leaving home until your fever has been gone for 24 hours without the use of medicine.  Keep all follow-up visits as told by your health care provider. This is important. How is this prevented?  Getting an annual flu shot is the best way to avoid getting the flu. You may get the flu shot in late summer, fall, or winter. Ask your health care provider when you should get your flu shot.  Wash your hands often or use hand sanitizer often.  Avoid contact with people who are sick during cold and flu season.  Eat a healthy diet, drink plenty of fluids, get enough sleep, and exercise regularly. Contact a health care provider if:  You develop new symptoms.  You have:  Chest pain.  Diarrhea.  A fever.  Your cough gets worse.  You produce more mucus.  You feel nauseous or you vomit. Get help right away if:  You develop  shortness of breath or difficulty breathing.  Your skin or nails turn a bluish color.  You have severe pain or stiffness in your neck.  You develop a sudden headache or sudden pain in your face or ear.  You cannot stop vomiting. This information is not intended to replace advice given to you by your health care provider. Make sure you discuss any questions you have with your health care provider. Document Released: 04/25/2000 Document Revised: 10/04/2015 Document Reviewed: 02/20/2015 Elsevier Interactive Patient Education  2017 [place] K. Panosh M.D.

## 2016-06-22 LAB — CULTURE, GROUP A STREP

## 2016-06-23 ENCOUNTER — Encounter: Payer: Self-pay | Admitting: Internal Medicine

## 2016-06-23 NOTE — Telephone Encounter (Signed)
The cough may last a awhile    If fever going away  And no blood in sputum  Would give more time see how you do  Although will feel lousey for a while    The color of the phlegm dosent tell us  If there is a  Bacterial infection   At this point  But keep [place] informed how you are doing  If not getting better before the weekend.

## 2016-07-02 ENCOUNTER — Other Ambulatory Visit (INDEPENDENT_AMBULATORY_CARE_PROVIDER_SITE_OTHER): Payer: PRIVATE HEALTH INSURANCE

## 2016-07-02 DIAGNOSIS — Z0000 Encounter for general adult medical examination without abnormal findings: Secondary | ICD-10-CM

## 2016-07-02 LAB — CBC WITH DIFFERENTIAL/PLATELET
BASOS ABS: 0 10*3/uL (ref 0.0–0.1)
Basophils Relative: 0.6 % (ref 0.0–3.0)
EOS ABS: 0.1 10*3/uL (ref 0.0–0.7)
Eosinophils Relative: 1.5 % (ref 0.0–5.0)
HCT: 38.8 % (ref 36.0–46.0)
HEMOGLOBIN: 13 g/dL (ref 12.0–15.0)
LYMPHS ABS: 1.5 10*3/uL (ref 0.7–4.0)
Lymphocytes Relative: 29.8 % (ref 12.0–46.0)
MCHC: 33.5 g/dL (ref 30.0–36.0)
MCV: 86.8 fl (ref 78.0–100.0)
MONO ABS: 0.3 10*3/uL (ref 0.1–1.0)
Monocytes Relative: 6.8 % (ref 3.0–12.0)
NEUTROS PCT: 61.3 % (ref 43.0–77.0)
Neutro Abs: 3.1 10*3/uL (ref 1.4–7.7)
Platelets: 308 10*3/uL (ref 150.0–400.0)
RBC: 4.47 Mil/uL (ref 3.87–5.11)
RDW: 12.9 % (ref 11.5–15.5)
WBC: 5.1 10*3/uL (ref 4.0–10.5)

## 2016-07-02 LAB — HEPATIC FUNCTION PANEL
ALBUMIN: 3.9 g/dL (ref 3.5–5.2)
ALK PHOS: 61 U/L (ref 39–117)
ALT: 15 U/L (ref 0–35)
AST: 16 U/L (ref 0–37)
Bilirubin, Direct: 0.1 mg/dL (ref 0.0–0.3)
TOTAL PROTEIN: 6 g/dL (ref 6.0–8.3)
Total Bilirubin: 0.4 mg/dL (ref 0.2–1.2)

## 2016-07-02 LAB — LIPID PANEL
CHOLESTEROL: 152 mg/dL (ref 0–200)
HDL: 43.7 mg/dL (ref >39.00)
LDL CALC: 99 mg/dL (ref 0–99)
NonHDL: 108.77
TRIGLYCERIDES: 48 mg/dL (ref 0.0–149.0)
Total CHOL/HDL Ratio: 3
VLDL: 9.6 mg/dL (ref 0.0–40.0)

## 2016-07-02 LAB — BASIC METABOLIC PANEL
BUN: 16 mg/dL (ref 6–23)
CALCIUM: 8.6 mg/dL (ref 8.4–10.5)
CO2: 29 mEq/L (ref 19–32)
Chloride: 105 mEq/L (ref 96–112)
Creatinine, Ser: 0.63 mg/dL (ref 0.40–1.20)
GFR: 102.68 mL/min (ref >60.00)
GLUCOSE: 91 mg/dL (ref 70–99)
POTASSIUM: 4.3 meq/L (ref 3.5–5.1)
Sodium: 138 mEq/L (ref 135–145)

## 2016-07-02 LAB — TSH: TSH: 2.06 u[IU]/mL (ref 0.35–4.50)

## 2016-07-08 NOTE — Progress Notes (Signed)
]  Chief Complaint  Patient presents with  . Annual Exam    med  evaluations    HPI: Patient  Karen Spears  61 y.o. comes in today for Preventive Health Care visit  And Chronic disease management meds   Fu resp infection ..flu and   Med management   Got better  After a week or so .   Has:  No migraine  2 advil  For ocass ha   Xanax : Needs for sleep ocass use sun am  1/2 - 1/4 for  Work   RESP :stopped the inhaler    Humidifier .  Had helped but hard to clean   BP elevation :   Has readings    Only one up 150/105 but repeated and 135/72 rest area 117 126 range  On metoprolol,   HRT"I want to stya on patch till I die" helps her   Still in reg counseling about 8 x per year knows triggers   Health Maintenance  Topic Date Due  . Hepatitis C Screening  05/14/2017 (Originally November 06, 1955)  . HIV Screening  05/14/2017 (Originally 02/28/1971)  . MAMMOGRAM  01/23/2018  . TETANUS/TDAP  09/20/2018  . COLONOSCOPY  03/06/2026  . INFLUENZA VACCINE  Completed   Health Maintenance Review LIFESTYLE:  Exercise:  Stationary bike and weight 3 x per week  Tobacco/ETS: no Alcohol: no Sugar beverages: no  Sl sweet  Sleep: take  alpraz olm . Drug use: no HH of 2  Work:  40 - 50     ROS:  Recovering from flu .   Got  A hive rash on back  That felt from the flu and took  antihistamin for stomach and reolved with illness  GEN/ HEENT: No fever, significant weight changes sweats headaches vision problems  Still hard to hear conversation across the room  But not really cant hear  fam hx of sis with hearing aids  CV/ PULM; No chest pain shortness of breath cough, syncope,edema  change in exercise tolerance. GI /GU: No adominal pain, vomiting, change in bowel habits. No blood in the stool. No significant GU symptoms. SKIN/HEME: ,no acute skin rashes suspicious lesions or bleeding. No lymphadenopathy, nodules, masses.  NEURO/ PSYCH:  No neurologic signs such as weakness numbness. No depression  anxiety. IMM/ Allergy: No unusual infections.  Allergy .   REST of 12 system review negative except as per HPI   Past Medical History:  Diagnosis Date  . Allergic rhinitis   . Allergy   . Anxiety   . Arthritis    hips  . Asthmatic bronchitis 07/30/2012  . Bladder polyps    and stones dr Amalia Hailey   . Bronchospasm 08/20/2012   Much improved on steroid inhaler and removal from old the house. At this time hold on specialty referral consider PFTs in the future maintain on Symbicort for n   . Chronic hip pain    bursitis and arthritis  . Drug rash 05/13/2011   ? Fixed rash  From diflucan  ?   Marland Kitchen Exposure to mold 07/30/2012  . Female pelvic pain    after hysterectomy ileoinguinal nerve 2/08 saw Dr Brien [facility] in the past  . GERD (gastroesophageal reflux disease)    prilosec prn only  . Headache(784.0)   . Hypertension   . MIGRAINE HEADACHE 03/29/2009   Qualifier: Diagnosis of  By: Regis Bill MD, Standley Brooking   . Migraines   . Nerve damage    pelvic floor  .  Neuromuscular disorder (HCC)    hips, pelvic floor   . Reaction to severe stress 12/31/2011    Past Surgical History:  Procedure Laterality Date  . ABDOMINAL HYSTERECTOMY    . CESAREAN SECTION  1985  . vocal cord surgery  1987  . WISDOM TOOTH EXTRACTION      Family History  Problem Relation Age of Onset  . Gallstones Mother   . Colon polyps Mother   . Gallstones Sister   . Bipolar disorder      child  . Colon polyps Father   . Colon cancer Neg Hx   . Esophageal cancer Neg Hx   . Rectal cancer Neg Hx   . Stomach cancer Neg Hx     Social History   Social History  . Marital status: Married    Spouse name: N/A  . Number of children: N/A  . Years of education: N/A   Social History Main Topics  . Smoking status: Never Smoker  . Smokeless tobacco: Never Used  . Alcohol use No  . Drug use: No  . Sexual activity: Not Asked   Other Topics Concern  . 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 albuterol (PROVENTIL HFA;VENTOLIN HFA) 108 (90 Base) MCG/ACT inhaler Inhale 1-2 puffs into the lungs every 6 (six) hours as needed for wheezing or shortness of breath. 1 Inhaler 2  . ALPRAZolam (XANAX) 0.5 MG tablet TAKE 1 TABLET BY MOUTH TWICE DAILY MAY TAKE 1& 1/2 TABLETS AT BEDTIME IF NEEDED 60 tablet 0  . ALPRAZolam (XANAX) 0.5 MG tablet TAKE 1 TABLET BY MOUTH TWICE DAILY. MAY TAKE 1 AND 1/2 TABLETS AT BEDTIME AS NEEDED. 60 tablet 0  . budesonide-formoterol (SYMBICORT) 160-4.5 MCG/ACT inhaler Inhale 2 puffs into the lungs 2 (two) times daily. 1 Inhaler 3  . estradiol (VIVELLE-DOT) 0.1 MG/24HR patch PLACE 1 PATCH ONTO THE SKIN TWICE WEEKLY 24 patch 0  . ibuprofen (ADVIL,MOTRIN) 800 MG tablet Take 1 tablet (800 mg total) by mouth 2 (two) times daily as needed. for pain 60 tablet 2  . ketoprofen (ORUDIS) 50 MG capsule Take 1 capsule (50 mg total) by mouth 4 (four) times daily as needed. 30 capsule 0  . lidocaine (LIDODERM) 5 % Remove & Discard patch within 12 hours or as directed by MD    . metoprolol succinate (TOPROL-XL) 25 MG 24 hr tablet TAKE 1 TABLET(25 MG) BY MOUTH DAILY 30 tablet 5  . oseltamivir (TAMIFLU) 75 MG capsule Take 1 capsule (75 mg total) by mouth 2 (two) times daily. For 5 days 10 capsule 0  . rizatriptan (MAXALT) 10 MG tablet Take 1 tablet (10 mg total) by mouth as needed. May repeat in 2 hours if needed 10 tablet 0  . promethazine (PHENERGAN) 25 MG suppository Place 1 suppository (25 mg total) rectally every 6 (six) hours as needed for nausea. 6 each 0   No facility-administered medications prior to visit.      EXAM:  BP 134/74 (BP Location: Right Arm, Patient Position: Sitting, Cuff Size: Normal)    Pulse 72   Temp 97.8 F (36.6 C) (  Oral)   Ht 5' 0.15" (1.528 m)   Wt 145 lb 6.4 oz (66 kg)   BMI 28.26 kg/m   Body mass index is 28.26 kg/m. Wt Readings from Last 3 Encounters:  07/09/16 145 lb 6.4 oz (66 kg)  06/20/16 141 lb (64 kg)  05/15/16 143 lb 11.2 oz (65.2 kg)    Physical Exam: Vital signs reviewed BOF:BPZW is a well-developed well-nourished alert cooperative    who appearsr stated age in no acute distress.  HEENT: normocephalic atraumatic , Eyes: PERRL EOM's full, conjunctiva clear, Nares: paten,t no deformity discharge or tenderness., Ears: no deformity EAC's clear TMs with normal landmarks. Mouth: clear OP, no lesions, edema.  Moist mucous membranes. Dentition in adequate repair. NECK: supple without masses, thyromegaly or bruits. CHEST/PULM:  Clear to auscultation and percussion breath sounds equal no wheeze , rales or rhonchi. No chest wall deformities or tenderness.Breast: normal by inspection . No dimpling, discharge, masses, tenderness or discharge . CV: PMI is nondisplaced, S1 S2 no gallops, murmurs, rubs. Peripheral pulses are full without delay.No JVD .  ABDOMEN: Bowel sounds normal nontender  No guard or rebound, no hepato splenomegal no CVA tenderness. Extremtities:  No clubbing cyanosis or edema, no acute joint swelling or redness no focal atrophy NEURO:  Oriented x3, cranial nerves 3-12 appear to be intact, no obvious focal weakness,gait within normal limits no abnormal reflexes or asymmetrical SKIN: No acute rashes normal turgor, color, no bruising or petechiae. PSYCH: Oriented, good eye contact, no obvious depression anxiety, cognition and judgment appear normal. LN: no cervical axillary inguinal adenopathy  Lab Results  Component Value Date   WBC 5.1 07/02/2016   HGB 13.0 07/02/2016   HCT 38.8 07/02/2016   PLT 308.0 07/02/2016   GLUCOSE 91 07/02/2016   CHOL 152 07/02/2016   TRIG 48.0 07/02/2016   HDL 43.70 07/02/2016   LDLCALC 99 07/02/2016   ALT 15  07/02/2016   AST 16 07/02/2016   NA 138 07/02/2016   K 4.3 07/02/2016   CL 105 07/02/2016   CREATININE 0.63 07/02/2016   BUN 16 07/02/2016   CO2 29 07/02/2016   TSH 2.06 07/02/2016    BP Readings from Last 3 Encounters:  07/09/16 134/74  06/20/16 140/82  05/15/16 (!) 160/88  bp readings reviweed with patient   Lab results reviewed with patient   ASSESSMENT AND PLAN:  Discussed the following assessment and plan:  Visit for preventive health examination  Medication management  Insomnia, unspecified type - disc alpraz benzo as dependent prducing for sleep but for now ok to continue no need for  intenisfication  will follow and relate for later,.   Hearing difficulty, unspecified laterality - Plan: Ambulatory referral to ENT  Postmenopausal HRT (hormone replacement therapy)  Patient Care Team: Burnis Medin, MD as PCP - General Patient Instructions  Glad you are doing better. Stay up to date on her mammogram when you're due for colon cancer screening we can talk about cologuard. Check your blood pressure readings a couple days a month to make sure they are at goal. We'll do a referral to ENT audiology about the hearing processing difficulty. No change in medicines at this time. Follow-up visit in about 6 months med check. Or as needed.  Healthy lifestyle includes : At least 150 minutes of exercise weeks  , weight at healthy levels, which is usually   BMI 19-25. Avoid trans fats and processed foods;  Increase fresh fruits and veges to 5 servings per day. And  avoid sweet beverages including tea and juice. Mediterranean diet with olive oil and nuts have been noted to be heart and brain healthy . Avoid tobacco products . Limit  alcohol to  7 per week for women and 14 servings for men.  Get adequate sleep . Wear seat belts . Don't text and drive .      Health Maintenance, Female Adopting a healthy lifestyle and getting preventive care can go a long way to promote  health and wellness. Talk with your health care provider about what schedule of regular examinations is right for you. This is a good chance for you to check in with your provider about disease prevention and staying healthy. In between checkups, there are plenty of things you can do on your own. Experts have done a lot of research about which lifestyle changes and preventive measures are most likely to keep you healthy. Ask your health care provider for more information. Weight and diet Eat a healthy diet  Be sure to include plenty of vegetables, fruits, low-fat dairy products, and lean protein.  Do not eat a lot of foods high in solid fats, added sugars, or salt.  Get regular exercise. This is one of the most important things you can do for your health.  Most adults should exercise for at least 150 minutes each week. The exercise should increase your heart rate and make you sweat (moderate-intensity exercise).  Most adults should also do strengthening exercises at least twice a week. This is in addition to the moderate-intensity exercise. Maintain a healthy weight  Body mass index (BMI) is a measurement that can be used to identify possible weight problems. It estimates body fat based on height and weight. Your health care provider can help determine your BMI and help you achieve or maintain a healthy weight.  For females 41 years of age and older:  A BMI below 18.5 is considered underweight.  A BMI of 18.5 to 24.9 is normal.  A BMI of 25 to 29.9 is considered overweight.  A BMI of 30 and above is considered obese. Watch levels of cholesterol and blood lipids  You should start having your blood tested for lipids and cholesterol at 60 years of age, then have this test every 5 years.  You may need to have your cholesterol levels checked more often if:  Your lipid or cholesterol levels are high.  You are older than 60 years of age.  You are at high risk for heart disease. Cancer  screening Lung Cancer  Lung cancer screening is recommended for adults 89-71 years old who are at high risk for lung cancer because of a history of smoking.  A yearly low-dose CT scan of the lungs is recommended for people who:  Currently smoke.  Have quit within the past 15 years.  Have at least a 30-pack-year history of smoking. A pack year is smoking an average of one pack of cigarettes a day for 1 year.  Yearly screening should continue until it has been 15 years since you quit.  Yearly screening should stop if you develop a health problem that would prevent you from having lung cancer treatment. Breast Cancer  Practice breast self-awareness. This means understanding how your breasts normally appear and feel.  It also means doing regular breast self-exams. Let your health care provider know about any changes, no matter how small.  If you are in your 20s or 30s, you should have a clinical breast exam (CBE) by a health  care provider every 1-3 years as part of a regular health exam.  If you are 79 or older, have a CBE every year. Also consider having a breast X-ray (mammogram) every year.  If you have a family history of breast cancer, talk to your health care provider about genetic screening.  If you are at high risk for breast cancer, talk to your health care provider about having an MRI and a mammogram every year.  Breast cancer gene (BRCA) assessment is recommended for women who have family members with BRCA-related cancers. BRCA-related cancers include:  Breast.  Ovarian.  Tubal.  Peritoneal cancers.  Results of the assessment will determine the need for genetic counseling and BRCA1 and BRCA2 testing. Cervical Cancer  Your health care provider may recommend that you be screened regularly for cancer of the pelvic organs (ovaries, uterus, and vagina). This screening involves a pelvic examination, including checking for microscopic changes to the surface of your cervix  (Pap test). You may be encouraged to have this screening done every 3 years, beginning at age 28.  For women ages 10-65, health care providers may recommend pelvic exams and Pap testing every 3 years, or they may recommend the Pap and pelvic exam, combined with testing for human papilloma virus (HPV), every 5 years. Some types of HPV increase your risk of cervical cancer. Testing for HPV may also be done on women of any age with unclear Pap test results.  Other health care providers may not recommend any screening for nonpregnant women who are considered low risk for pelvic cancer and who do not have symptoms. Ask your health care provider if a screening pelvic exam is right for you.  If you have had past treatment for cervical cancer or a condition that could lead to cancer, you need Pap tests and screening for cancer for at least 20 years after your treatment. If Pap tests have been discontinued, your risk factors (such as having a new sexual partner) need to be reassessed to determine if screening should resume. Some women have medical problems that increase the chance of getting cervical cancer. In these cases, your health care provider may recommend more frequent screening and Pap tests. Colorectal Cancer  This type of cancer can be detected and often prevented.  Routine colorectal cancer screening usually begins at 60 years of age and continues through 60 years of age.  Your health care provider may recommend screening at an earlier age if you have risk factors for colon cancer.  Your health care provider may also recommend using home test kits to check for hidden blood in the stool.  A small camera at the end of a tube can be used to examine your colon directly (sigmoidoscopy or colonoscopy). This is done to check for the earliest forms of colorectal cancer.  Routine screening usually begins at age 2.  Direct examination of the colon should be repeated every 5-10 years through 60 years  of age. However, you may need to be screened more often if early forms of precancerous polyps or small growths are found. Skin Cancer  Check your skin from head to toe regularly.  Tell your health care provider about any new moles or changes in moles, especially if there is a change in a mole's shape or color.  Also tell your health care provider if you have a mole that is larger than the size of a pencil eraser.  Always use sunscreen. Apply sunscreen liberally and repeatedly throughout the day.  Protect yourself by wearing long sleeves, pants, a wide-brimmed hat, and sunglasses whenever you are outside. Heart disease, diabetes, and high blood pressure  High blood pressure causes heart disease and increases the risk of stroke. High blood pressure is more likely to develop in:  People who have blood pressure in the high end of the normal range (130-139/85-89 mm Hg).  People who are overweight or obese.  People who are African American.  If you are 28-2 years of age, have your blood pressure checked every 3-5 years. If you are 58 years of age or older, have your blood pressure checked every year. You should have your blood pressure measured twice-once when you are at a hospital or clinic, and once when you are not at a hospital or clinic. Record the average of the two measurements. To check your blood pressure when you are not at a hospital or clinic, you can use:  An automated blood pressure machine at a pharmacy.  A home blood pressure monitor.  If you are between 37 years and 50 years old, ask your health care provider if you should take aspirin to prevent strokes.  Have regular diabetes screenings. This involves taking a blood sample to check your fasting blood sugar level.  If you are at a normal weight and have a low risk for diabetes, have this test once every three years after 60 years of age.  If you are overweight and have a high risk for diabetes, consider being tested at  a younger age or more often. Preventing infection Hepatitis B  If you have a higher risk for hepatitis B, you should be screened for this virus. You are considered at high risk for hepatitis B if:  You were born in a country where hepatitis B is common. Ask your health care provider which countries are considered high risk.  Your parents were born in a high-risk country, and you have not been immunized against hepatitis B (hepatitis B vaccine).  You have HIV or AIDS.  You use needles to inject street drugs.  You live with someone who has hepatitis B.  You have had sex with someone who has hepatitis B.  You get hemodialysis treatment.  You take certain medicines for conditions, including cancer, organ transplantation, and autoimmune conditions. Hepatitis C  Blood testing is recommended for:  Everyone born from [birth year] through 1964.  Anyone with known risk factors for hepatitis C. Sexually transmitted infections (STIs)  You should be screened for sexually transmitted infections (STIs) including gonorrhea and chlamydia if:  You are sexually active and are younger than 60 years of age.  You are older than 60 years of age and your health care provider tells you that you are at risk for this type of infection.  Your sexual activity has changed since you were last screened and you are at an increased risk for chlamydia or gonorrhea. Ask your health care provider if you are at risk.  If you do not have HIV, but are at risk, it may be recommended that you take a prescription medicine daily to prevent HIV infection. This is called pre-exposure prophylaxis (PrEP). You are considered at risk if:  You are sexually active and do not regularly use condoms or know the HIV status of your partner(s).  You take drugs by injection.  You are sexually active with a partner who has HIV. Talk with your health care provider about whether you are at high risk of being infected with HIV. If  you choose  to begin PrEP, you should first be tested for HIV. You should then be tested every 3 months for as long as you are taking PrEP. Pregnancy  If you are premenopausal and you may become pregnant, ask your health care provider about preconception counseling.  If you may become pregnant, take 400 to 800 micrograms (mcg) of folic acid every day.  If you want to prevent pregnancy, talk to your health care provider about birth control (contraception). Osteoporosis and menopause  Osteoporosis is a disease in which the bones lose minerals and strength with aging. This can result in serious bone fractures. Your risk for osteoporosis can be identified using a bone density scan.  If you are 4 years of age or older, or if you are at risk for osteoporosis and fractures, ask your health care provider if you should be screened.  Ask your health care provider whether you should take a calcium or vitamin D supplement to lower your risk for osteoporosis.  Menopause may have certain physical symptoms and risks.  Hormone replacement therapy may reduce some of these symptoms and risks. Talk to your health care provider about whether hormone replacement therapy is right for you. Follow these instructions at home:  Schedule regular health, dental, and eye exams.  Stay current with your immunizations.  Do not use any tobacco products including cigarettes, chewing tobacco, or electronic cigarettes.  If you are pregnant, do not drink alcohol.  If you are breastfeeding, limit how much and how often you drink alcohol.  Limit alcohol intake to no more than 1 drink per day for nonpregnant women. One drink equals 12 ounces of beer, 5 ounces of wine, or 1 ounces of hard liquor.  Do not use street drugs.  Do not share needles.  Ask your health care provider for help if you need support or information about quitting drugs.  Tell your health care provider if you often feel depressed.  Tell your health care  provider if you have ever been abused or do not feel safe at home. This information is not intended to replace advice given to you by your health care provider. Make sure you discuss any questions you have with your health care provider. Document Released: 11/11/2010 Document Revised: 10/04/2015 Document Reviewed: 01/30/2015 Elsevier Interactive Patient Education  2017 [place]. Dontasia Miranda M.D.

## 2016-07-09 ENCOUNTER — Encounter: Payer: Self-pay | Admitting: Internal Medicine

## 2016-07-09 ENCOUNTER — Ambulatory Visit (INDEPENDENT_AMBULATORY_CARE_PROVIDER_SITE_OTHER): Payer: PRIVATE HEALTH INSURANCE | Admitting: Internal Medicine

## 2016-07-09 VITALS — BP 134/74 | HR 72 | Temp 97.8°F | Ht 60.15 in | Wt 145.4 lb

## 2016-07-09 DIAGNOSIS — G4700 Insomnia, unspecified: Secondary | ICD-10-CM | POA: Diagnosis not present

## 2016-07-09 DIAGNOSIS — Z79890 Hormone replacement therapy (postmenopausal): Secondary | ICD-10-CM

## 2016-07-09 DIAGNOSIS — Z79899 Other long term (current) drug therapy: Secondary | ICD-10-CM | POA: Diagnosis not present

## 2016-07-09 DIAGNOSIS — Z0000 Encounter for general adult medical examination without abnormal findings: Secondary | ICD-10-CM

## 2016-07-09 DIAGNOSIS — H9190 Unspecified hearing loss, unspecified ear: Secondary | ICD-10-CM

## 2016-07-09 NOTE — Patient Instructions (Addendum)
Glad you are doing better. Stay up to date on her mammogram when you're due for colon cancer screening we can talk about cologuard. Check your blood pressure readings a couple days a month to make sure they are at goal. We'll do a referral to ENT audiology about the hearing processing difficulty. No change in medicines at this time. Follow-up visit in about 6 months med check. Or as needed.  Healthy lifestyle includes : At least 150 minutes of exercise weeks  , weight at healthy levels, which is usually   BMI 19-25. Avoid trans fats and processed foods;  Increase fresh fruits and veges to 5 servings per day. And avoid sweet beverages including tea and juice. Mediterranean diet with olive oil and nuts have been noted to be heart and brain healthy . Avoid tobacco products . Limit  alcohol to  7 per week for women and 14 servings for men.  Get adequate sleep . Wear seat belts . Don't text and drive .      Health Maintenance, Female Adopting a healthy lifestyle and getting preventive care can go a long way to promote health and wellness. Talk with your health care provider about what schedule of regular examinations is right for you. This is a good chance for you to check in with your provider about disease prevention and staying healthy. In between checkups, there are plenty of things you can do on your own. Experts have done a lot of research about which lifestyle changes and preventive measures are most likely to keep you healthy. Ask your health care provider for more information. Weight and diet Eat a healthy diet  Be sure to include plenty of vegetables, fruits, low-fat dairy products, and lean protein.  Do not eat a lot of foods high in solid fats, added sugars, or salt.  Get regular exercise. This is one of the most important things you can do for your health.  Most adults should exercise for at least 150 minutes each week. The exercise should increase your heart rate and make you  sweat (moderate-intensity exercise).  Most adults should also do strengthening exercises at least twice a week. This is in addition to the moderate-intensity exercise. Maintain a healthy weight  Body mass index (BMI) is a measurement that can be used to identify possible weight problems. It estimates body fat based on height and weight. Your health care provider can help determine your BMI and help you achieve or maintain a healthy weight.  For females 53 years of age and older:  A BMI below 18.5 is considered underweight.  A BMI of 18.5 to 24.9 is normal.  A BMI of 25 to 29.9 is considered overweight.  A BMI of 30 and above is considered obese. Watch levels of cholesterol and blood lipids  You should start having your blood tested for lipids and cholesterol at 60 years of age, then have this test every 5 years.  You may need to have your cholesterol levels checked more often if:  Your lipid or cholesterol levels are high.  You are older than 60 years of age.  You are at high risk for heart disease. Cancer screening Lung Cancer  Lung cancer screening is recommended for adults 79-37 years old who are at high risk for lung cancer because of a history of smoking.  A yearly low-dose CT scan of the lungs is recommended for people who:  Currently smoke.  Have quit within the past 15 years.  Have at least  a 30-pack-year history of smoking. A pack year is smoking an average of one pack of cigarettes a day for 1 year.  Yearly screening should continue until it has been 15 years since you quit.  Yearly screening should stop if you develop a health problem that would prevent you from having lung cancer treatment. Breast Cancer  Practice breast self-awareness. This means understanding how your breasts normally appear and feel.  It also means doing regular breast self-exams. Let your health care provider know about any changes, no matter how small.  If you are in your 20s or 30s,  you should have a clinical breast exam (CBE) by a health care provider every 1-3 years as part of a regular health exam.  If you are 33 or older, have a CBE every year. Also consider having a breast X-ray (mammogram) every year.  If you have a family history of breast cancer, talk to your health care provider about genetic screening.  If you are at high risk for breast cancer, talk to your health care provider about having an MRI and a mammogram every year.  Breast cancer gene (BRCA) assessment is recommended for women who have family members with BRCA-related cancers. BRCA-related cancers include:  Breast.  Ovarian.  Tubal.  Peritoneal cancers.  Results of the assessment will determine the need for genetic counseling and BRCA1 and BRCA2 testing. Cervical Cancer  Your health care provider may recommend that you be screened regularly for cancer of the pelvic organs (ovaries, uterus, and vagina). This screening involves a pelvic examination, including checking for microscopic changes to the surface of your cervix (Pap test). You may be encouraged to have this screening done every 3 years, beginning at age 7.  For women ages 70-65, health care providers may recommend pelvic exams and Pap testing every 3 years, or they may recommend the Pap and pelvic exam, combined with testing for human papilloma virus (HPV), every 5 years. Some types of HPV increase your risk of cervical cancer. Testing for HPV may also be done on women of any age with unclear Pap test results.  Other health care providers may not recommend any screening for nonpregnant women who are considered low risk for pelvic cancer and who do not have symptoms. Ask your health care provider if a screening pelvic exam is right for you.  If you have had past treatment for cervical cancer or a condition that could lead to cancer, you need Pap tests and screening for cancer for at least 20 years after your treatment. If Pap tests have  been discontinued, your risk factors (such as having a new sexual partner) need to be reassessed to determine if screening should resume. Some women have medical problems that increase the chance of getting cervical cancer. In these cases, your health care provider may recommend more frequent screening and Pap tests. Colorectal Cancer  This type of cancer can be detected and often prevented.  Routine colorectal cancer screening usually begins at 60 years of age and continues through 60 years of age.  Your health care provider may recommend screening at an earlier age if you have risk factors for colon cancer.  Your health care provider may also recommend using home test kits to check for hidden blood in the stool.  A small camera at the end of a tube can be used to examine your colon directly (sigmoidoscopy or colonoscopy). This is done to check for the earliest forms of colorectal cancer.  Routine screening usually  begins at age 76.  Direct examination of the colon should be repeated every 5-10 years through 60 years of age. However, you may need to be screened more often if early forms of precancerous polyps or small growths are found. Skin Cancer  Check your skin from head to toe regularly.  Tell your health care provider about any new moles or changes in moles, especially if there is a change in a mole's shape or color.  Also tell your health care provider if you have a mole that is larger than the size of a pencil eraser.  Always use sunscreen. Apply sunscreen liberally and repeatedly throughout the day.  Protect yourself by wearing long sleeves, pants, a wide-brimmed hat, and sunglasses whenever you are outside. Heart disease, diabetes, and high blood pressure  High blood pressure causes heart disease and increases the risk of stroke. High blood pressure is more likely to develop in:  People who have blood pressure in the high end of the normal range (130-139/85-89 mm  Hg).  People who are overweight or obese.  People who are African American.  If you are 34-84 years of age, have your blood pressure checked every 3-5 years. If you are 64 years of age or older, have your blood pressure checked every year. You should have your blood pressure measured twice-once when you are at a hospital or clinic, and once when you are not at a hospital or clinic. Record the average of the two measurements. To check your blood pressure when you are not at a hospital or clinic, you can use:  An automated blood pressure machine at a pharmacy.  A home blood pressure monitor.  If you are between 64 years and 68 years old, ask your health care provider if you should take aspirin to prevent strokes.  Have regular diabetes screenings. This involves taking a blood sample to check your fasting blood sugar level.  If you are at a normal weight and have a low risk for diabetes, have this test once every three years after 60 years of age.  If you are overweight and have a high risk for diabetes, consider being tested at a younger age or more often. Preventing infection Hepatitis B  If you have a higher risk for hepatitis B, you should be screened for this virus. You are considered at high risk for hepatitis B if:  You were born in a country where hepatitis B is common. Ask your health care provider which countries are considered high risk.  Your parents were born in a high-risk country, and you have not been immunized against hepatitis B (hepatitis B vaccine).  You have HIV or AIDS.  You use needles to inject street drugs.  You live with someone who has hepatitis B.  You have had sex with someone who has hepatitis B.  You get hemodialysis treatment.  You take certain medicines for conditions, including cancer, organ transplantation, and autoimmune conditions. Hepatitis C  Blood testing is recommended for:  Everyone born from [birth year] through 1964.  Anyone with known  risk factors for hepatitis C. Sexually transmitted infections (STIs)  You should be screened for sexually transmitted infections (STIs) including gonorrhea and chlamydia if:  You are sexually active and are younger than 60 years of age.  You are older than 60 years of age and your health care provider tells you that you are at risk for this type of infection.  Your sexual activity has changed since you were last screened  and you are at an increased risk for chlamydia or gonorrhea. Ask your health care provider if you are at risk.  If you do not have HIV, but are at risk, it may be recommended that you take a prescription medicine daily to prevent HIV infection. This is called pre-exposure prophylaxis (PrEP). You are considered at risk if:  You are sexually active and do not regularly use condoms or know the HIV status of your partner(s).  You take drugs by injection.  You are sexually active with a partner who has HIV. Talk with your health care provider about whether you are at high risk of being infected with HIV. If you choose to begin PrEP, you should first be tested for HIV. You should then be tested every 3 months for as long as you are taking PrEP. Pregnancy  If you are premenopausal and you may become pregnant, ask your health care provider about preconception counseling.  If you may become pregnant, take 400 to 800 micrograms (mcg) of folic acid every day.  If you want to prevent pregnancy, talk to your health care provider about birth control (contraception). Osteoporosis and menopause  Osteoporosis is a disease in which the bones lose minerals and strength with aging. This can result in serious bone fractures. Your risk for osteoporosis can be identified using a bone density scan.  If you are 67 years of age or older, or if you are at risk for osteoporosis and fractures, ask your health care provider if you should be screened.  Ask your health care provider whether you  should take a calcium or vitamin D supplement to lower your risk for osteoporosis.  Menopause may have certain physical symptoms and risks.  Hormone replacement therapy may reduce some of these symptoms and risks. Talk to your health care provider about whether hormone replacement therapy is right for you. Follow these instructions at home:  Schedule regular health, dental, and eye exams.  Stay current with your immunizations.  Do not use any tobacco products including cigarettes, chewing tobacco, or electronic cigarettes.  If you are pregnant, do not drink alcohol.  If you are breastfeeding, limit how much and how often you drink alcohol.  Limit alcohol intake to no more than 1 drink per day for nonpregnant women. One drink equals 12 ounces of beer, 5 ounces of wine, or 1 ounces of hard liquor.  Do not use street drugs.  Do not share needles.  Ask your health care provider for help if you need support or information about quitting drugs.  Tell your health care provider if you often feel depressed.  Tell your health care provider if you have ever been abused or do not feel safe at home. This information is not intended to replace advice given to you by your health care provider. Make sure you discuss any questions you have with your health care provider. Document Released: 11/11/2010 Document Revised: 10/04/2015 Document Reviewed: 01/30/2015 Elsevier Interactive Patient Education  2017 [employer].

## 2016-07-23 ENCOUNTER — Other Ambulatory Visit: Payer: Self-pay | Admitting: Internal Medicine

## 2016-08-11 ENCOUNTER — Other Ambulatory Visit: Payer: Self-pay | Admitting: Internal Medicine

## 2016-08-11 NOTE — Telephone Encounter (Signed)
Ok to refill x 1  

## 2016-08-11 NOTE — Telephone Encounter (Signed)
Please advise 

## 2016-08-19 ENCOUNTER — Other Ambulatory Visit: Payer: Self-pay | Admitting: Internal Medicine

## 2016-08-19 DIAGNOSIS — H903 Sensorineural hearing loss, bilateral: Secondary | ICD-10-CM | POA: Insufficient documentation

## 2016-09-13 ENCOUNTER — Other Ambulatory Visit: Payer: Self-pay | Admitting: Internal Medicine

## 2016-09-16 ENCOUNTER — Other Ambulatory Visit: Payer: Self-pay | Admitting: Internal Medicine

## 2016-09-29 ENCOUNTER — Other Ambulatory Visit: Payer: Self-pay | Admitting: Internal Medicine

## 2016-09-30 NOTE — Telephone Encounter (Signed)
Ok to refill x 1  

## 2016-10-14 ENCOUNTER — Other Ambulatory Visit: Payer: Self-pay | Admitting: Internal Medicine

## 2016-10-17 ENCOUNTER — Other Ambulatory Visit: Payer: Self-pay | Admitting: Internal Medicine

## 2016-11-29 ENCOUNTER — Other Ambulatory Visit: Payer: Self-pay | Admitting: Family Medicine

## 2016-12-01 ENCOUNTER — Other Ambulatory Visit: Payer: Self-pay | Admitting: Internal Medicine

## 2016-12-01 NOTE — Telephone Encounter (Signed)
Ok to refill x 1  

## 2016-12-10 ENCOUNTER — Ambulatory Visit (INDEPENDENT_AMBULATORY_CARE_PROVIDER_SITE_OTHER): Payer: PRIVATE HEALTH INSURANCE | Admitting: Internal Medicine

## 2016-12-10 ENCOUNTER — Encounter: Payer: Self-pay | Admitting: Internal Medicine

## 2016-12-10 VITALS — BP 140/80 | HR 66 | Temp 97.9°F | Wt 147.3 lb

## 2016-12-10 DIAGNOSIS — H9190 Unspecified hearing loss, unspecified ear: Secondary | ICD-10-CM

## 2016-12-10 DIAGNOSIS — Z79899 Other long term (current) drug therapy: Secondary | ICD-10-CM

## 2016-12-10 DIAGNOSIS — B373 Candidiasis of vulva and vagina: Secondary | ICD-10-CM

## 2016-12-10 DIAGNOSIS — N760 Acute vaginitis: Secondary | ICD-10-CM

## 2016-12-10 DIAGNOSIS — J029 Acute pharyngitis, unspecified: Secondary | ICD-10-CM | POA: Diagnosis not present

## 2016-12-10 DIAGNOSIS — R102 Pelvic and perineal pain: Secondary | ICD-10-CM

## 2016-12-10 DIAGNOSIS — B3731 Acute candidiasis of vulva and vagina: Secondary | ICD-10-CM

## 2016-12-10 LAB — POCT RAPID STREP A (OFFICE): Rapid Strep A Screen: NEGATIVE

## 2016-12-10 MED ORDER — CEPHALEXIN 500 MG PO CAPS: 500.0000 mg | ORAL_CAPSULE | Freq: Two times a day (BID) | ORAL | 0 refills | Status: AC

## 2016-12-10 MED ORDER — TERCONAZOLE 0.8 % VA CREA: 1.0000 | TOPICAL_CREAM | Freq: Every day | VAGINAL | 2 refills | Status: DC

## 2016-12-10 MED ORDER — LIDOCAINE 5 % EX PTCH: 1.0000 | MEDICATED_PATCH | CUTANEOUS | 1 refills | Status: DC

## 2016-12-10 NOTE — Patient Instructions (Addendum)
Checking for strep . Can begin antibiotic   If needed pending results  This still could be viral  Ok to continue  Alprazolam as needed ewith cautions discussed .  Your urinalysis is normal  Will send in  Terconazole   .   Will refill also  For resistant yeast.  If bp  At goal 120/80 or therabouts continue medicaiton  Otherwise CPX with labs at visit in   Feb 2019 or therabouts

## 2016-12-10 NOTE — Progress Notes (Signed)
Chief Complaint  Patient presents with  . Sore Throat    HPI: Karen Spears 61 y.o.  sda   Plus a number  of other issues   Exposed to strep in  [place] .    Sore throat very bad .     advill tiny runny nose.   At most hard to swallow  No fever   Hx strep in past     Slipped in rain and fall aggravated her pelvic pain  Asks for refill of lidocaine patches    Used monistat x 2   For  Yeast sx although irritative   Still with some sx recur  ? Other options        Due for med check uses alpraz  At night to wind down and 1/2 or 1/4 pre serom on sat sundays  As needed   Got hearing aids and  Energy is improved   Can adjust with Bt and doing great with this    Bp seems ok but up when in pain today  ROS: See pertinent positives and negatives per HPI.  Past Medical History:  Diagnosis Date  . Allergic rhinitis   . Allergy   . Anxiety   . Arthritis    hips  . Asthmatic bronchitis 07/30/2012  . Bladder polyps    and stones dr Amalia Hailey   . Bronchospasm 08/20/2012   Much improved on steroid inhaler and removal from old the house. At this time hold on specialty referral consider PFTs in the future maintain on Symbicort for n   . Chronic hip pain    bursitis and arthritis  . Drug rash 05/13/2011   ? Fixed rash  From diflucan  ?   Marland Kitchen Exposure to mold 07/30/2012  . 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 GERD (gastroesophageal reflux disease)    prilosec prn only  . Headache(784.0)   . Hypertension   . MIGRAINE HEADACHE 03/29/2009   Qualifier: Diagnosis of  By: Regis Bill MD, Standley Brooking   . Migraines   . Nerve damage    pelvic floor  . Neuromuscular disorder (HCC)    hips, pelvic floor   . Reaction to severe stress 12/31/2011    Family History  Problem Relation Age of Onset  . Gallstones Mother   . Colon polyps Mother   . Gallstones Sister   . Bipolar disorder Unknown        child  . Colon polyps Father   . Colon  cancer Neg Hx   . Esophageal cancer Neg Hx   . Rectal cancer Neg Hx   . Stomach cancer Neg Hx     Social History   Social History  . Marital status: Married    Spouse name: N/A  . Number of children: N/A  . Years of education: N/A   Social History Main Topics  . Smoking status: Never Smoker  . Smokeless tobacco: Never Used  . Alcohol use No  . Drug use: No  . Sexual activity: Not Asked   Other Topics Concern  . 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    Has [employer]    Married   Regular exercise- no some   Has grandchildren   Hs farm property [place]   A child is bipolar  Outpatient Medications Prior to Visit  Medication Sig Dispense Refill  . albuterol (PROVENTIL HFA;VENTOLIN HFA) 108 (90 Base) MCG/ACT inhaler Inhale 1-2 puffs into the lungs every 6 (six) hours as needed for wheezing or shortness of breath. 1 Inhaler 2  . ALPRAZolam (XANAX) 0.5 MG tablet TAKE 1 TABLET BY MOUTH TWICE DAILY MAY TAKE 1& 1/2 TABLETS AT BEDTIME IF NEEDED 60 tablet 0  . ALPRAZolam (XANAX) 0.5 MG tablet TAKE 1 TABLET BY MOUTH TWICE DAILY AND 1 AND 1/2 EVERY NIGHT AT BEDTIME AS NEEDED 60 tablet 0  . budesonide-formoterol (SYMBICORT) 160-4.5 MCG/ACT inhaler Inhale 2 puffs into the lungs 2 (two) times daily. 1 Inhaler 3  . estradiol (VIVELLE-DOT) 0.1 MG/24HR patch PLACE 1 PATCH ONTO THE SKIN TWICE WEEKLY 24 patch 0  . ibuprofen (ADVIL,MOTRIN) 800 MG tablet TAKE 1 TABLET(800 MG) BY MOUTH TWICE DAILY AS NEEDED FOR PAIN 60 tablet 1  . ketoprofen (ORUDIS) 50 MG capsule Take 1 capsule (50 mg total) by mouth 4 (four) times daily as needed. 30 capsule 0  . metoprolol succinate (TOPROL-XL) 25 MG 24 hr tablet TAKE 1 TABLET(25 MG) BY MOUTH DAILY 30 tablet 1  . ALPRAZolam (XANAX) 0.5 MG tablet TAKE 1 TABLET BY MOUTH TWICE DAILY. MAY TAKE 1 AND 1/2 TABLETS AT BEDTIME AS NEEDED. 60 tablet 0  . ALPRAZolam  (XANAX) 0.5 MG tablet TAKE 1 TABLET BY MOUTH TWICE DAILY. MAY TAKE 1 AND 1/2 TABLETS AT BEDTIME AS NEEDED 60 tablet 0  . ALPRAZolam (XANAX) 0.5 MG tablet TAKE 1 TABLET TWICE DAILY AND MAY TAKE 1 AND 1/2 TABLETS AT BEDTIME AS NEEDED 60 tablet 0  . estradiol (VIVELLE-DOT) 0.1 MG/24HR patch UNWRAP AND APPLY 1 PATCH EXTERNALLY TO THE SKIN TWICE A WEEK 8 patch 1  . lidocaine (LIDODERM) 5 % Remove & Discard patch within 12 hours or as directed by MD    . oseltamivir (TAMIFLU) 75 MG capsule Take 1 capsule (75 mg total) by mouth 2 (two) times daily. For 5 days 10 capsule 0  . promethazine (PHENERGAN) 25 MG suppository Place 1 suppository (25 mg total) rectally every 6 (six) hours as needed for nausea. 6 each 0  . rizatriptan (MAXALT) 10 MG tablet Take 1 tablet (10 mg total) by mouth as needed. May repeat in 2 hours if needed (Patient not taking: Reported on 12/10/2016) 10 tablet 0   No facility-administered medications prior to visit.      EXAM:  BP 140/80 (BP Location: Right Arm, Patient Position: Sitting, Cuff Size: Normal)   Pulse 66   Temp 97.9 F (36.6 C) (Oral)   Wt 147 lb 4.8 oz (66.8 kg)   BMI 28.62 kg/m   Body mass index is 28.62 kg/m. WDWN in NAD  quiet respirations;  Nl respirations  Non toxic . HEENT: Normocephalic ;atraumatic , Eyes;  PERRL, EOMs  Full, lids and conjunctiva clear,,Ears: no deformities, canals nl, TM landmarks normal, Nose: no deformity or discharge ;face minimally to non tender Mouth : OP 2+ red  without lesion or edema . Neck: Supple tender right ac node   Chest:  Clear to A&P without wheezes rales or rhonchi CV:  S1-S2 no gallops or murmurs peripheral perfusion is normal Skin :nl perfusion and no acute rashes  PSYCH: pleasant and cooperative, no obvious depression or anxiety ASSESSMENT AND PLAN:  Discussed the following assessment and plan:  Pharyngitis, unspecified etiology - strep exposures  cx pending  rx sent in case  needed  - Plan: POCT rapid strep  A,  Culture, Group A Strep  Yeast vaginitis  Medication management - benefit more than risk at this time  Female pelvic pain - aggravated by slip and fall  refill lidoderm patch   Recurrent vaginitis - presumed yeast se diflucan  trial terazole   Hearing difficulty, unspecified laterality - hearing aids  helping her a good bit .   -Patient advised to return or notify health care team  if symptoms worsen ,persist or new concerns arise.  Patient Instructions  Checking for strep . Can begin antibiotic   If needed pending results  This still could be viral  Ok to continue  Alprazolam as needed ewith cautions discussed .  Your urinalysis is normal  Will send in  Terconazole   .   Will refill also  For resistant yeast.  If bp  At goal 120/80 or therabouts continue medicaiton  Otherwise CPX with labs at visit in   Feb 2019 or therabouts     Standley Brooking. Panosh M.D.

## 2016-12-12 ENCOUNTER — Other Ambulatory Visit: Payer: Self-pay | Admitting: Internal Medicine

## 2016-12-12 LAB — CULTURE, GROUP A STREP

## 2016-12-14 ENCOUNTER — Telehealth: Payer: Self-pay | Admitting: Internal Medicine

## 2016-12-15 NOTE — Telephone Encounter (Signed)
Patient is coming up for a medication check appointment this month. Sent in a 30 day supply for blood pressure medication. Please help patient schedule.

## 2016-12-16 NOTE — Telephone Encounter (Signed)
Pt will callback to schedule. Pt is driving. Pt needs a refill on estradiol send to [facility] [address] in [place]. Pt is out of med

## 2016-12-16 NOTE — Telephone Encounter (Signed)
Spoke with pharmacist about medication and patient pick up rx this morning. Nothing further needed

## 2017-01-04 ENCOUNTER — Other Ambulatory Visit: Payer: Self-pay | Admitting: Internal Medicine

## 2017-01-05 NOTE — Telephone Encounter (Signed)
Ok x 1 refill

## 2017-01-12 ENCOUNTER — Other Ambulatory Visit: Payer: Self-pay | Admitting: Internal Medicine

## 2017-01-28 ENCOUNTER — Encounter: Payer: Self-pay | Admitting: Internal Medicine

## 2017-01-28 ENCOUNTER — Ambulatory Visit (INDEPENDENT_AMBULATORY_CARE_PROVIDER_SITE_OTHER): Payer: PRIVATE HEALTH INSURANCE | Admitting: Internal Medicine

## 2017-01-28 VITALS — BP 128/70 | HR 61 | Temp 98.0°F | Wt 148.6 lb

## 2017-01-28 DIAGNOSIS — I10 Essential (primary) hypertension: Secondary | ICD-10-CM | POA: Diagnosis not present

## 2017-01-28 DIAGNOSIS — Z79899 Other long term (current) drug therapy: Secondary | ICD-10-CM

## 2017-01-28 MED ORDER — METOPROLOL SUCCINATE ER 50 MG PO TB24: ORAL_TABLET | ORAL | 3 refills | Status: DC

## 2017-01-28 NOTE — Patient Instructions (Addendum)
Try   50 mg  xr 1/2   Twice a day .   If  bp too low  Or puse below 55 on a regulat basis let us know .    Send in readings on mychart.

## 2017-01-28 NOTE — Progress Notes (Signed)
Chief Complaint  Patient presents with  . Follow-up    on BP : Elevated readings ranging 140s/70s to 160s/90s    HPI: Karen Spears 61 y.o. come in for  Fu BP readings s requested she is taking metoprolol XL are 25 mga day.She has readings that are mostly in the 1 4150 range although occasionally 120/70-90. No side effects of medicine. Atenolol was better      In remote past .  BP reproted as up and down  Tends to inc in afternoon  140 and 150 range  ? Pulse  When lays down at night gets transient  Palpitation  No sx    Remote hx of murmur as a child . Feeling ok    No headaches for a while( orig reason for b blocker)   Asks if me may help her mansity issues as she is doing pretty well now.    ROS: See pertinent positives and negatives per HPI.  Past Medical History:  Diagnosis Date  . Allergic rhinitis   . Allergy   . Anxiety   . Arthritis    hips  . Asthmatic bronchitis 07/30/2012  . Bladder polyps    and stones dr Amalia Hailey   . Bronchospasm 08/20/2012   Much improved on steroid inhaler and removal from old the house. At this time hold on specialty referral consider PFTs in the future maintain on Symbicort for n   . Chronic hip pain    bursitis and arthritis  . Drug rash 05/13/2011   ? Fixed rash  From diflucan  ?   Marland Kitchen Exposure to mold 07/30/2012  . 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 GERD (gastroesophageal reflux disease)    prilosec prn only  . Headache(784.0)   . Hypertension   . MIGRAINE HEADACHE 03/29/2009   Qualifier: Diagnosis of  By: Regis Bill MD, Standley Brooking   . Migraines   . Nerve damage    pelvic floor  . Neuromuscular disorder (HCC)    hips, pelvic floor   . Reaction to severe stress 12/31/2011    Family History  Problem Relation Age of Onset  . Gallstones Mother   . Colon polyps Mother   . Gallstones Sister   . Bipolar disorder Unknown        child  . Colon polyps Father   . Colon cancer Neg  Hx   . Esophageal cancer Neg Hx   . Rectal cancer Neg Hx   . Stomach cancer Neg Hx     Social History   Social History  . Marital status: Married    Spouse name: N/A  . Number of children: N/A  . Years of education: N/A   Social History Main Topics  . Smoking status: Never Smoker  . Smokeless tobacco: Never Used  . Alcohol use No  . Drug use: No  . Sexual activity: Not Asked   Other Topics Concern  . 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 albuterol (PROVENTIL HFA;VENTOLIN HFA) 108 (90 Base) MCG/ACT inhaler  Inhale 1-2 puffs into the lungs every 6 (six) hours as needed for wheezing or shortness of breath. 1 Inhaler 2  . ALPRAZolam (XANAX) 0.5 MG tablet TAKE 1 TABLET BY MOUTH TWICE DAILY MAY TAKE 1& 1/2 TABLETS AT BEDTIME IF NEEDED 60 tablet 0  . ALPRAZolam (XANAX) 0.5 MG tablet TAKE 1 TABLET BY MOUTH TWICE DAILY AND 1 AND 1/2 EVERY NIGHT AT BEDTIME AS NEEDED 60 tablet 0  . ALPRAZolam (XANAX) 0.5 MG tablet TAKE 1 TABLET TWICE DAILY AND 1 AND 1/2 TABLETS BY MOUTH EVERY NIGHT AT BEDTIME 60 tablet 0  . budesonide-formoterol (SYMBICORT) 160-4.5 MCG/ACT inhaler Inhale 2 puffs into the lungs 2 (two) times daily. 1 Inhaler 3  . estradiol (VIVELLE-DOT) 0.1 MG/24HR patch PLACE 1 PATCH ONTO THE SKIN TWICE WEEKLY 24 patch 0  . estradiol (VIVELLE-DOT) 0.1 MG/24HR patch UNWRAP AND APPLY 1 PATCH EXTERNALLY TO THE SKIN TWICE A WEEK 8 patch 1  . ibuprofen (ADVIL,MOTRIN) 800 MG tablet TAKE 1 TABLET(800 MG) BY MOUTH TWICE DAILY AS NEEDED FOR PAIN 60 tablet 1  . ketoprofen (ORUDIS) 50 MG capsule Take 1 capsule (50 mg total) by mouth 4 (four) times daily as needed. 30 capsule 0  . lidocaine  (LIDODERM) 5 % Place 1 patch onto the skin as directed. Remove & Discard patch within 12 hours or as directed by MD 30 patch 1  . promethazine (PHENERGAN) 25 MG suppository Place 1 suppository (25 mg total) rectally every 6 (six) hours as needed for nausea. 6 each 0  . rizatriptan (MAXALT) 10 MG tablet Take 1 tablet (10 mg total) by mouth as needed. May repeat in 2 hours if needed 10 tablet 0  . terconazole (TERAZOL 3) 0.8 % vaginal cream Place 1 applicator vaginally at bedtime. 20 g 2  . metoprolol succinate (TOPROL-XL) 25 MG 24 hr tablet TAKE 1 TABLET BY MOUTH DAILY 30 tablet 5   No facility-administered medications prior to visit.      EXAM:  BP 128/70 (BP Location: Right Arm, Patient Position: Sitting, Cuff Size: Normal)   Pulse 61   Temp 98 F (36.7 C) (Oral)   Wt 148 lb 9.6 oz (67.4 kg)   BMI 28.88 kg/m   Body mass index is 28.88 kg/m.  GENERAL: vitals reviewed and listed above, alert, oriented, appears well hydrated and in no acute distress HEENT: atraumatic, conjunctiva  clear, no obvious abnormalities on inspection of external nose and earsNECK: no obvious masses on inspection palpation  LUNGS: clear to auscultation bilaterally, no wheezes, rales or rhonchi, good air movement CV: HRRR,  No murmur noted . no clubbing cyanosis or  peripheral edema nl cap refill  MS: moves all extremities without noticeable focal  abnormality PSYCH: pleasant and cooperative, no obvious depression or anxiety active animated .  Lab Results  Component Value Date   WBC 5.1 07/02/2016   HGB 13.0 07/02/2016   HCT 38.8 07/02/2016   PLT 308.0 07/02/2016   GLUCOSE 91 07/02/2016   CHOL 152 07/02/2016   TRIG 48.0 07/02/2016   HDL 43.70 07/02/2016   LDLCALC 99 07/02/2016   ALT 15 07/02/2016   AST 16 07/02/2016   NA 138 07/02/2016   K 4.3 07/02/2016   CL 105 07/02/2016   CREATININE 0.63 07/02/2016   BUN 16 07/02/2016   CO2 29 07/02/2016   TSH 2.06 07/02/2016   BP Readings from Last 3  Encounters:  01/28/17 128/70  12/10/16 140/80  07/09/16 134/74   Wt Readings from Last 3 Encounters:  01/28/17 148 lb 9.6 oz (67.4 kg)  12/10/16 147 lb 4.8 oz (66.8 kg)  07/09/16 145 lb 6.4 oz (66 kg)   reviewed bp log   ASSESSMENT AND PLAN:  Discussed the following assessment and plan:  Hypertension, unspecified type ? - inmetoprolol to 25 bid ER  1/2 of the 50  and send in readings my chart   or if pulse 50 adn below consdier other meds ARB   Medication management She was originally on the beta blocker for headache control for which she hasn't a problem now. And then when she was under extreme anxiety with palpitations I believe. At this time we will she has a normal cardiac exam but occasional flip flop . When she lays down at night which appears to be insignificant. Discussed other medicines can be used for blood pressure control however reasonable at this time to increase her metoprolol to 50 mg a day but take 25  Er twice a day and keep [place] updated  Will plan on flu vaccine closer to season NOV -Patient advised to return or notify health care team  if  new concerns arise.  Patient Instructions  Try   50 mg  xr 1/2   Twice a day .   If  bp too low  Or puse below 55 on a regulat basis let us know .    Send in readings on mychart.           Standley Brooking. Ozie Lupe M.D.

## 2017-01-30 ENCOUNTER — Encounter: Payer: Self-pay | Admitting: Internal Medicine

## 2017-02-02 ENCOUNTER — Other Ambulatory Visit: Payer: Self-pay | Admitting: Internal Medicine

## 2017-02-06 ENCOUNTER — Other Ambulatory Visit: Payer: Self-pay | Admitting: Internal Medicine

## 2017-02-09 NOTE — Telephone Encounter (Signed)
Can refill x 1 

## 2017-02-17 ENCOUNTER — Encounter: Payer: Self-pay | Admitting: Internal Medicine

## 2017-02-18 IMAGING — MR MR ABDOMEN WO/W CM MRCP
10 of 23 series · 20 of 48 positions shown · IV contrast (multihance)
Comparison: Abdominal ultrasound 01/30/2016

CLINICAL DATA: Abdominal pain. Intrahepatic biliary dilatation
suspected on recent ultrasound examination.

EXAM:
MRI ABDOMEN WITHOUT AND WITH CONTRAST (INCLUDING MRCP)
TECHNIQUE: Multiplanar multisequence MR imaging of the abdomen was performed
both before and after the administration of intravenous contrast.
Heavily T2-weighted images of the biliary and pancreatic ducts were
obtained, and three-dimensional MRCP images were rendered by post
processing.
CONTRAST:  12mL MULTIHANCE GADOBENATE DIMEGLUMINE 529 MG/ML IV SOLN

[Series 3: T2 fat-sat · axial · 5.0mm · 0.78mm/px · z∈[-98,+137]mm · 2 of 48 slices shown]
[im 1/48]
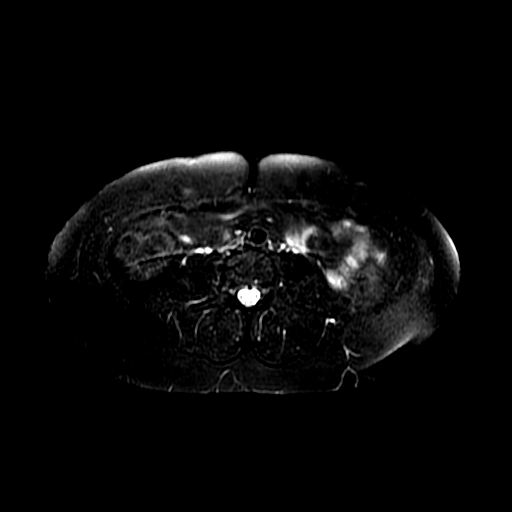
[im 48/48]
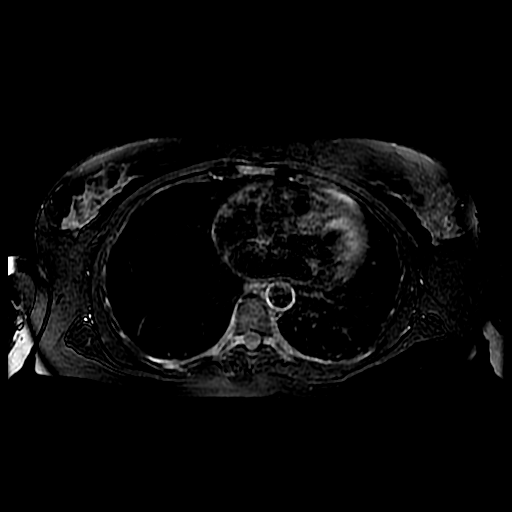

[Series 5: MRCP · coronal · 2.0mm · 0.70mm/px · 1 of 43 slices shown (1 of 2)]
[im 1/43]
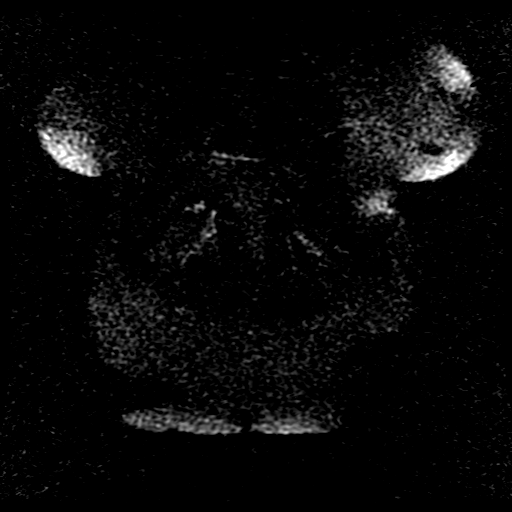

[Series 6: DWI b500 · axial · 6.0mm · 1.48mm/px · z∈[-95,+139]mm · 2 of 62 slices shown]
[im 1/62]
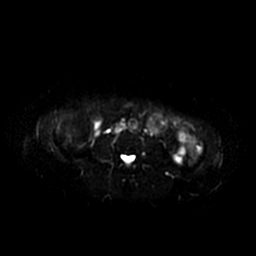
[im 62/62]
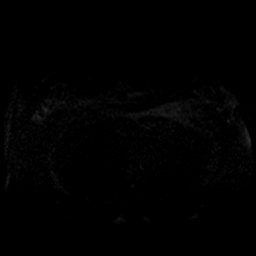

[Series 7: ax dualecho · axial · 5.0mm · 0.78mm/px · z∈[-105,+130]mm · 3 of 96 slices shown]
[im 1/96]
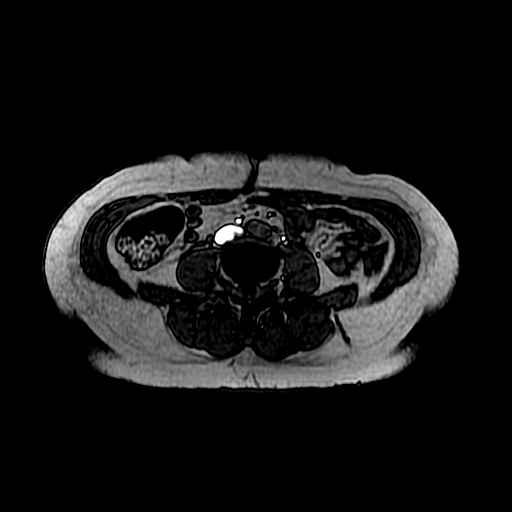
[im 48/96]
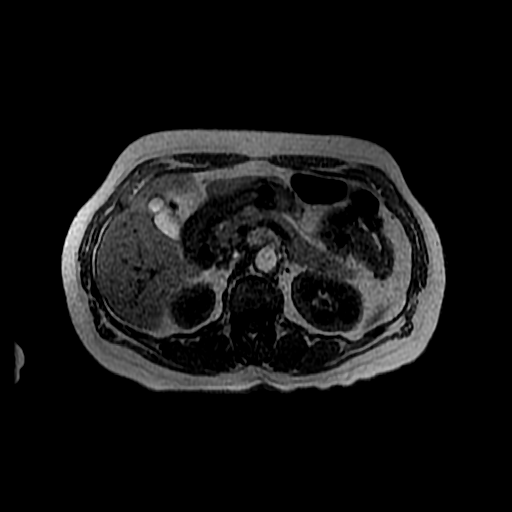
[im 96/96]
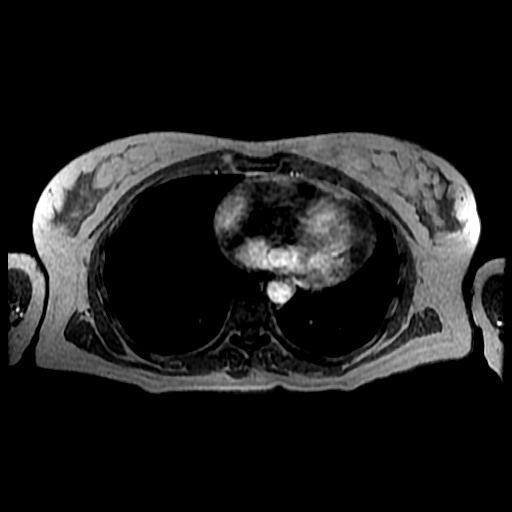

[Series 8: MRCP · coronal · 40.0mm · 0.70mm/px · 1 of 6 slices shown (2 of 2)]
[im 1/6]
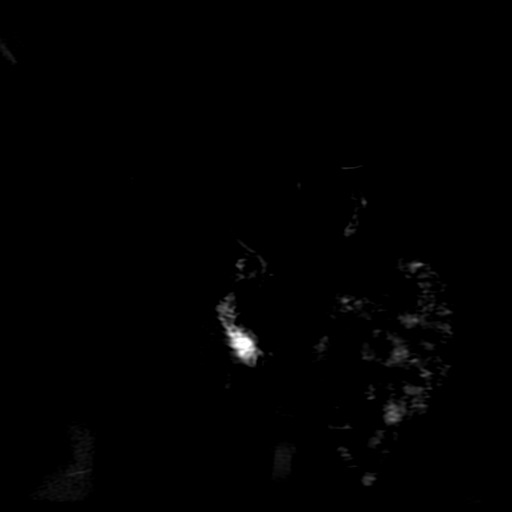

[Series 9: bSSFP fat-sat · coronal · 5.0mm · 0.70mm/px · 1 of 37 slices shown]
[im 1/37]
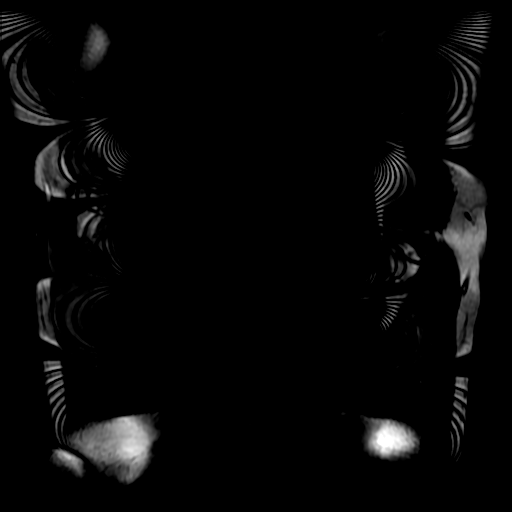

[Series 10: T2 · axial · 5.0mm · 0.78mm/px · 1 of 48 slices shown]
[im 1/48]
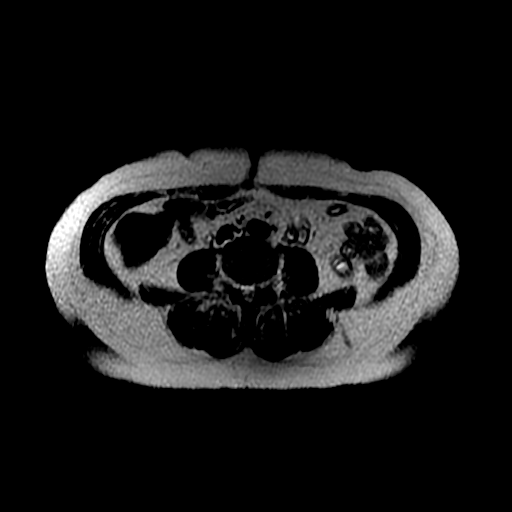

[Series 12: T1 dynamic · coronal · delayed · 4.0mm · 0.78mm/px · 2 of 88 slices shown]
[im 1/88]
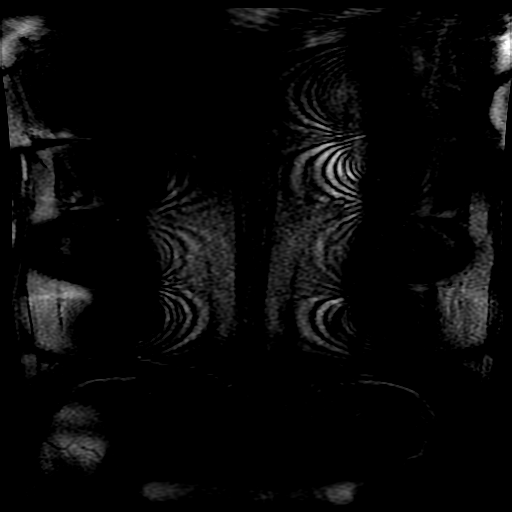
[im 88/88]
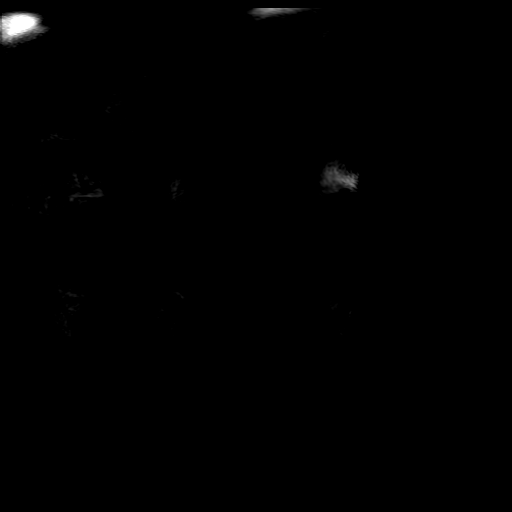

[Series 400: reformatted · axial · 1.6mm · 0.62mm/px · z∈[-3,+128]mm · 3 of 112 slices shown (1 of 2)]
[im 1/112]
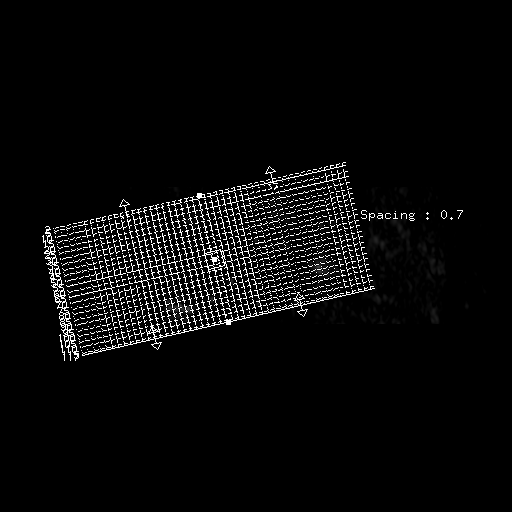
[im 56/112]
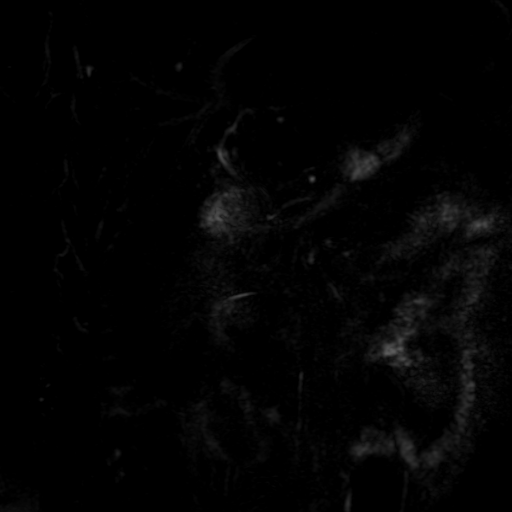
[im 112/112]
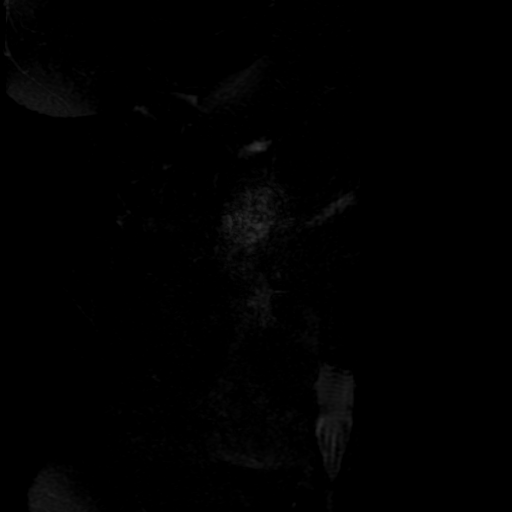

[Series 401: reformatted · coronal · 100.0mm · 0.51mm/px · 4 of 176 slices shown (2 of 2)]
[im 1/176]
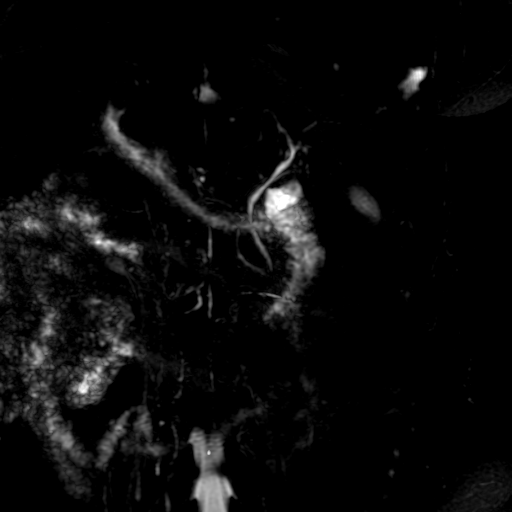
[im 44/176]
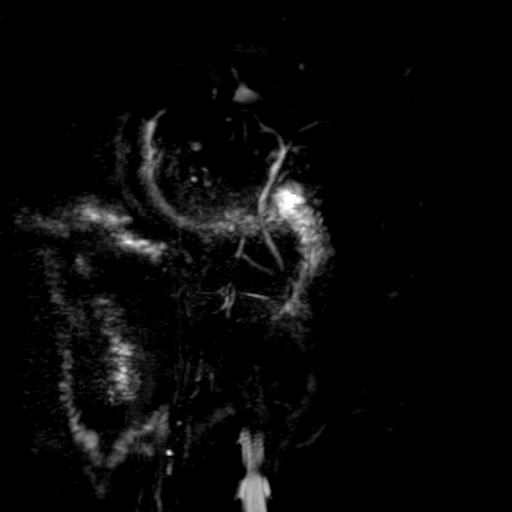
[im 88/176]
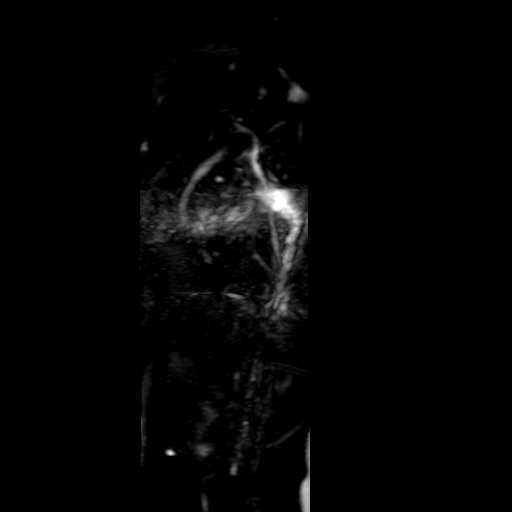
[im 132/176]
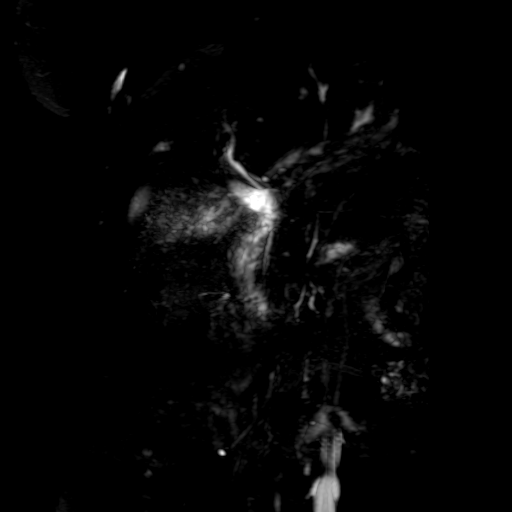

[20 of 48 positions shown; findings below may reference images not displayed]

FINDINGS: Lower chest: The lung bases are grossly clear. No pleural or
pericardial effusion.

Hepatobiliary: No focal hepatic lesions or intrahepatic biliary
dilatation. The gallbladder is normal. No definite gallstones.
Normal caliber and course of the common bile duct.

Pancreas:  No mass, inflammation or ductal dilatation.

Spleen:  Normal size.  No focal lesions.

Adrenals/Urinary Tract: The adrenal glands normal. Small renal cysts
are noted. A tiny cyst projecting off the midpole region of the left
kidney is a hemorrhagic cyst. No worrisome renal lesions or
hydronephrosis.

Stomach/Bowel: The stomach, duodenum, small bowel and colon are
grossly normal. No inflammatory changes, mass lesions or obstructive
findings.

Vascular/Lymphatic: The aorta and branch vessels are patent. The
major venous structures are patent. No mesenteric or retroperitoneal
mass or adenopathy.

Other:  No ascites or abdominal wall hernia.

Musculoskeletal: No significant bony findings.
IMPRESSION: Unremarkable abdominal MRI examination. No intra or extrahepatic
biliary dilatation pain and normal gallbladder. No acute abdominal
findings, mass lesions or adenopathy.

Small bilateral renal cysts.

## 2017-02-18 NOTE — Telephone Encounter (Signed)
Please advise Dr Regis Bill on BP readings and possible reaction to increased dose of Metoprolol, thanks.

## 2017-02-19 NOTE — Telephone Encounter (Signed)
Not sure why the itching but if  Feel worse  Go back down to  25 mg per day  ( instead of 50 mg per day)   And we can add a different medication  Losartan 25 mg  1 po qd disp 30  Refill x 1 and then send in readings after a month .

## 2017-02-21 ENCOUNTER — Encounter: Payer: Self-pay | Admitting: Internal Medicine

## 2017-02-25 NOTE — Telephone Encounter (Signed)
I am fine with this plan.

## 2017-02-25 NOTE — Telephone Encounter (Signed)
Please see patients e-mail - pt does not want to make any changes at this time to her medications. Plans to see Cards soon. Pt to call once she gets an appt.  Previous rec's: Burnis Medin, MD at 02/19/2017 8:32 AM   Not sure why the itching but if Feel worse  Go back down to 25 mg per day ( instead of 50 mg per day)   And we can add a different medication  Losartan 25 mg 1 po qd disp 30 Refill x 1 and then send in readings after a month .

## 2017-03-04 NOTE — Telephone Encounter (Signed)
Please see update e-mail. FYI. Thanks.

## 2017-03-04 NOTE — Telephone Encounter (Signed)
I am okay with staying on the same dose and monitoring since her blood pressure so much better after vacation. No compelling reason to see cardiology at this time unless you are having symptoms or difficulty controlling the blood pressure.

## 2017-03-29 ENCOUNTER — Other Ambulatory Visit: Payer: Self-pay | Admitting: Internal Medicine

## 2017-04-24 ENCOUNTER — Encounter: Payer: Self-pay | Admitting: Internal Medicine

## 2017-05-04 NOTE — Telephone Encounter (Signed)
I am ok with decreasing dose to 0.25 with same sig. [please confirm that is the request.   Also make sure  Patient aware  I was out of office   When this message came in .

## 2017-05-08 ENCOUNTER — Other Ambulatory Visit: Payer: Self-pay | Admitting: Internal Medicine

## 2017-05-08 MED ORDER — KETOPROFEN 50 MG PO CAPS: 50.0000 mg | ORAL_CAPSULE | Freq: Four times a day (QID) | ORAL | 0 refills | Status: DC | PRN

## 2017-05-08 MED ORDER — ALPRAZOLAM 0.25 MG PO TABS: ORAL_TABLET | ORAL | 0 refills | Status: DC

## 2017-05-08 MED ORDER — RIZATRIPTAN BENZOATE 10 MG PO TABS: 10.0000 mg | ORAL_TABLET | ORAL | 0 refills | Status: DC | PRN

## 2017-05-08 MED ORDER — PROMETHAZINE HCL 25 MG RE SUPP: 25.0000 mg | Freq: Four times a day (QID) | RECTAL | 0 refills | Status: DC | PRN

## 2017-05-08 NOTE — Telephone Encounter (Signed)
Pt requesting refills ASAP on all three medications: Maxalt last filled 04/17/16 #10 Phenergan last filled 04/17/16 #6 Ketoprofen last filled 04/17/16 #30  Pt states that her HAs have returned and she is needing these refilled.   Please advise Dr Regis Bill, thanks.

## 2017-05-08 NOTE — Telephone Encounter (Signed)
Rx sent to pharmacy.  Nothing further needed.  

## 2017-05-08 NOTE — Telephone Encounter (Signed)
Rx phoned into pharmacy at new dose.  Pt aware via [employer].  Nothing further needed.

## 2017-05-12 ENCOUNTER — Other Ambulatory Visit: Payer: Self-pay | Admitting: Internal Medicine

## 2017-05-13 ENCOUNTER — Telehealth: Payer: Self-pay | Admitting: Internal Medicine

## 2017-05-13 NOTE — Telephone Encounter (Signed)
Please advise Dr Regis Bill alternative/substitute for Orudis , thanks.

## 2017-05-13 NOTE — Telephone Encounter (Signed)
Copied from [place] [id number]. Topic: Quick Communication - Rx Refill/Question >> May 13, 2017 12:18 PM Arletha Grippe wrote: Has the patient contacted their pharmacy? Yes.     (Agent: If no, request that the patient contact the pharmacy for the refill.)   Preferred Pharmacy (with phone number or street name): jake from [facility] called - the med ketoprofen (ORUDIS) 50 MG capsule is not being made right now.  He would like to know what to substitute it with.  Please call pharm back at # [phone]    Agent: Please be advised that RX refills may take up to 3 business days. We ask that you follow-up with your pharmacy.

## 2017-05-15 NOTE — Telephone Encounter (Signed)
Option to take  narproxyn  sodium  275  Take 2 then 1 every 60- 8 hours    Disp 40 refill x 1  This med is similar to aleve    So shouldn't take with ibuprofen

## 2017-05-15 NOTE — Telephone Encounter (Signed)
ATC pt several times, automated message states person not accepting calls at this time, call back later.   WCB

## 2017-05-20 NOTE — Telephone Encounter (Signed)
Pt states that she does not want to do this medication as she is taking Ibuprofen as a part of pain management for nerve damage. Pt wants Dr Regis Bill to be aware of this and that she is going to hold on any additional medication at this time.   Will send to Dr Regis Bill as Juluis Rainier.

## 2017-05-25 ENCOUNTER — Other Ambulatory Visit: Payer: Self-pay | Admitting: Internal Medicine

## 2017-05-27 ENCOUNTER — Other Ambulatory Visit: Payer: Self-pay | Admitting: Internal Medicine

## 2017-06-22 ENCOUNTER — Other Ambulatory Visit: Payer: Self-pay | Admitting: Internal Medicine

## 2017-06-25 NOTE — Telephone Encounter (Signed)
Last filled 05/08/17 , #45, 0rf Please advise Dr Regis Bill, thanks.

## 2017-06-25 NOTE — Telephone Encounter (Signed)
I sent in electronically.

## 2017-06-30 ENCOUNTER — Encounter: Payer: Self-pay | Admitting: Internal Medicine

## 2017-06-30 ENCOUNTER — Ambulatory Visit: Payer: PRIVATE HEALTH INSURANCE | Admitting: Internal Medicine

## 2017-06-30 VITALS — BP 132/88 | HR 67 | Temp 97.9°F | Wt 150.6 lb

## 2017-06-30 DIAGNOSIS — R102 Pelvic and perineal pain: Secondary | ICD-10-CM | POA: Diagnosis not present

## 2017-06-30 DIAGNOSIS — J029 Acute pharyngitis, unspecified: Secondary | ICD-10-CM

## 2017-06-30 DIAGNOSIS — R6889 Other general symptoms and signs: Secondary | ICD-10-CM

## 2017-06-30 DIAGNOSIS — J019 Acute sinusitis, unspecified: Secondary | ICD-10-CM

## 2017-06-30 LAB — POCT INFLUENZA A/B
Influenza A, POC: NEGATIVE
Influenza B, POC: NEGATIVE

## 2017-06-30 LAB — POCT RAPID STREP A (OFFICE): Rapid Strep A Screen: NEGATIVE

## 2017-06-30 MED ORDER — DOXYCYCLINE HYCLATE 100 MG PO TABS: 100.0000 mg | ORAL_TABLET | Freq: Two times a day (BID) | ORAL | 0 refills | Status: DC

## 2017-06-30 MED ORDER — TERCONAZOLE 0.8 % VA CREA: 1.0000 | TOPICAL_CREAM | Freq: Every day | VAGINAL | 2 refills | Status: DC

## 2017-06-30 NOTE — Progress Notes (Signed)
Chief Complaint  Patient presents with  . Acute Visit    exposed to the flu, started aprx. jan 30, body aches, chills,night sweats, sore throat,dry cough, head congestion, no headaches    HPI: Karen Spears 62 y.o.  SDA  come in for poss flu sx  Husband had illness a few weeks ago .    Yesterday gotbery bad .      Cough last night    Not yet in chrest/.  Has had dripping  Nose  In past week  Or so and  Yesterday had sore throat and ears and throat pains and now face pain .  Hoarse sleightly   Needs  handicap renewal .     For her pelvic   Pain when flaring  ROS: See pertinent positives and negatives per HPI. Not successful    Losing weight    Past Medical History:  Diagnosis Date  . Allergic rhinitis   . Allergy   . Anxiety   . Arthritis    hips  . Asthmatic bronchitis 07/30/2012  . Bladder polyps    and stones dr Amalia Hailey   . Bronchospasm 08/20/2012   Much improved on steroid inhaler and removal from old the house. At this time hold on specialty referral consider PFTs in the future maintain on Symbicort for n   . Chronic hip pain    bursitis and arthritis  . Drug rash 05/13/2011   ? Fixed rash  From diflucan  ?   Marland Kitchen Exposure to mold 07/30/2012  . 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 GERD (gastroesophageal reflux disease)    prilosec prn only  . Headache(784.0)   . Hypertension   . MIGRAINE HEADACHE 03/29/2009   Qualifier: Diagnosis of  By: Regis Bill MD, Standley Brooking   . Migraines   . Nerve damage    pelvic floor  . Neuromuscular disorder (HCC)    hips, pelvic floor   . Reaction to severe stress 12/31/2011    Family History  Problem Relation Age of Onset  . Gallstones Mother   . Colon polyps Mother   . Gallstones Sister   . Bipolar disorder Unknown        child  . Colon polyps Father   . Colon cancer Neg Hx   . Esophageal cancer Neg Hx   . Rectal cancer Neg Hx   . Stomach cancer Neg Hx     Social History    Socioeconomic History  . Marital status: Married    Spouse name: None  . Number of children: None  . Years of education: None  . Highest education level: None  Social Needs  . Financial resource strain: None  . Food insecurity - worry: None  . Food insecurity - inability: None  . Transportation needs - medical: None  . Transportation needs - non-medical: None  Occupational History  . None  Tobacco Use  . Smoking status: Never Smoker  . Smokeless tobacco: Never Used  Substance and Sexual Activity  . Alcohol use: No    Alcohol/week: 0.0 oz  . Drug use: No  . Sexual activity: None  Other Topics Concern  . 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 albuterol (PROVENTIL HFA;VENTOLIN HFA) 108 (90 Base) MCG/ACT inhaler Inhale 1-2 puffs into the lungs every 6 (six) hours as needed for wheezing or shortness of breath. 1 Inhaler 2  . ALPRAZolam (XANAX) 0.25 MG tablet TAKE 1 1/2 TABLETS BY MOUTH AT BEDTIME AS NEEDED 45 tablet 0  . budesonide-formoterol (SYMBICORT) 160-4.5 MCG/ACT inhaler Inhale 2 puffs into the lungs 2 (two) times daily. 1 Inhaler 3  . estradiol (VIVELLE-DOT) 0.1 MG/24HR patch UNWRAP AND APPLY 1 PATCH EXTERNALLY TO THE SKIN TWICE A WEEK 8 patch 2  . ibuprofen (ADVIL,MOTRIN) 800 MG tablet TAKE 1 TABLET(800 MG) BY MOUTH TWICE DAILY AS NEEDED FOR PAIN 60 tablet 1  . ketoprofen (ORUDIS) 50 MG capsule Take 1 capsule (50 mg total) by mouth 4 (four) times daily as needed. 30 capsule 0  . lidocaine (LIDODERM) 5 % Place 1 patch onto the skin as directed. Remove & Discard patch within 12 hours or as directed by MD 30 patch 1  . metoprolol succinate (TOPROL-XL) 50 MG 24 hr tablet TAKE  1/2 TABLET BY MOUTH TWICE DAILY AS DIRECTED 30 tablet 4  . rizatriptan (MAXALT) 10 MG tablet Take 1 tablet (10 mg total) by mouth as needed. May repeat in 2 hours if needed 10 tablet 0  . ALPRAZolam (XANAX) 0.5 MG tablet TAKE 1 TABLET BY MOUTH TWICE DAILY AND 1 AND 1/2 EVERY NIGHT AT BEDTIME AS NEEDED 60 tablet 0  . ALPRAZolam (XANAX) 0.5 MG tablet TAKE 1 TABLET TWICE DAILY AND 1 AND 1/2 TABLETS BY MOUTH EVERY NIGHT AT BEDTIME 60 tablet 0  . ALPRAZolam (XANAX) 0.5 MG tablet TAKE 1 TABLET TWICE DAILY AND 1 AND 1/2 TABLETS BY MOUTH EVERY NIGHT AT BEDTIME 60 tablet 0  . terconazole (TERAZOL 3) 0.8 % vaginal cream Place 1 applicator vaginally at bedtime. 20 g 2  . promethazine (PHENERGAN) 25 MG suppository Place 1 suppository (25 mg total) rectally every 6 (six) hours as needed for up to 7 days for nausea. 6 each 0   No facility-administered medications prior to visit.      EXAM:  BP 132/88 (BP Location: Right Arm, Patient Position: Sitting, Cuff Size: Normal)   Pulse 67   Temp 97.9 F (36.6 C) (Oral)   Wt 150 lb 9.6 oz (68.3 kg)   BMI 29.27 kg/m   Body mass index is 29.27 kg/m. WDWN in NAD  quiet respirations; mildly congested  somewhat hoarse. Non toxic . HEENT: Normocephalic ;atraumatic , Eyes;  PERRL, EOMs  Full, lids and conjunctiva clear,,Ears: no deformities, canals nl, TM landmarks normal, Nose: no deformity or discharge but congested;face maxillary tender Mouth : OP clear without lesion or edema . qhite red  Neck: Supple  Tender ac nodes  no or masses or bruits Chest:  Clear to A&P without wheezes rales or rhonchi CV:  S1-S2 no gallops or murmurs peripheral perfusion is normal Skin :nl perfusion and no acute rashes    Lab Results  Component Value Date   WBC 5.1 07/02/2016   HGB 13.0 07/02/2016   HCT 38.8 07/02/2016   PLT 308.0 07/02/2016   GLUCOSE 91 07/02/2016   CHOL 152 07/02/2016   TRIG 48.0 07/02/2016   HDL 43.70 07/02/2016   LDLCALC 99 07/02/2016   ALT 15  07/02/2016   AST 16 07/02/2016   NA 138 07/02/2016   K 4.3  07/02/2016   CL 105 07/02/2016   CREATININE 0.63 07/02/2016   BUN 16 07/02/2016   CO2 29 07/02/2016   TSH 2.06 07/02/2016   BP Readings from Last 3 Encounters:  06/30/17 132/88  01/28/17 128/70  12/10/16 140/80    ASSESSMENT AND PLAN:  Discussed the following assessment and plan:  Pharyngitis, unspecified etiology - Plan: POCT rapid strep A, Culture, Group A Strep, POC Influenza A/B  Flu-like symptoms - Plan: POCT rapid strep A, Culture, Group A Strep, POC Influenza A/B  Acute sinusitis, recurrence not specified, unspecified location  Female pelvic pain   prob viral many exposures occupational   But if  sinusits  Ongoing can add  Antibiotic  Form handicapped  Renew fillled out   Walking when far -Patient advised to return or notify health care team  if  new concerns arise.  Patient Instructions  This acts like viral infection but checking for strep . And if   Sinus infection can add antibiotic if needed.     Other viruses   And such still run their course.   May need to get checked for hemochromotosis  If son  Has the problem       Standley Brooking. Jaevon Paras M.D.

## 2017-06-30 NOTE — Patient Instructions (Addendum)
This acts like viral infection but checking for strep . And if   Sinus infection can add antibiotic if needed.     Other viruses   And such still run their course.   May need to get checked for hemochromotosis  If son  Has the problem

## 2017-07-01 ENCOUNTER — Encounter: Payer: Self-pay | Admitting: Internal Medicine

## 2017-07-01 NOTE — Telephone Encounter (Signed)
Take the doxy doesn't sound like strep

## 2017-07-02 LAB — CULTURE, GROUP A STREP
MICRO NUMBER: 90218451
SPECIMEN QUALITY:: ADEQUATE

## 2017-07-25 ENCOUNTER — Other Ambulatory Visit: Payer: Self-pay | Admitting: Internal Medicine

## 2017-07-29 ENCOUNTER — Other Ambulatory Visit: Payer: Self-pay | Admitting: Internal Medicine

## 2017-07-30 NOTE — Telephone Encounter (Signed)
Xanax last filled on 06/25/17 #45 x 0rf Ibuprofen last filled on 12/01/16 #60 x 1rf Last OV 06/30/17  Please advise Dr Regis Bill, thanks.

## 2017-07-31 NOTE — Telephone Encounter (Signed)
rx's phoned in. Nothing further needed.

## 2017-07-31 NOTE — Telephone Encounter (Signed)
Ok to refill each x 1  

## 2017-07-31 NOTE — Telephone Encounter (Signed)
Patient checking status, please advise °

## 2017-08-22 ENCOUNTER — Other Ambulatory Visit: Payer: Self-pay | Admitting: Internal Medicine

## 2017-08-28 ENCOUNTER — Other Ambulatory Visit: Payer: Self-pay | Admitting: Internal Medicine

## 2017-09-02 NOTE — Telephone Encounter (Signed)
Last OV: 06/30/2016 Last filled: 07/31/2017, #60, 0 RF Sig: TAKE 1 TABLET(800 MG) BY MOUTH TWICE DAILY AS NEEDED FOR PAIN

## 2017-09-09 ENCOUNTER — Other Ambulatory Visit: Payer: Self-pay | Admitting: Internal Medicine

## 2017-10-09 ENCOUNTER — Other Ambulatory Visit: Payer: Self-pay | Admitting: Internal Medicine

## 2017-10-09 NOTE — Telephone Encounter (Signed)
Last filled Xanax 07/31/17, #45 x 0 rf Last OV 06/30/17  Please advise Dr Regis Bill, thanks.

## 2017-10-13 ENCOUNTER — Other Ambulatory Visit: Payer: Self-pay | Admitting: Internal Medicine

## 2017-10-14 NOTE — Telephone Encounter (Signed)
Please advise Dr Regis Bill, thanks.  Last CPE 06/2016 - pt advised to follow 1 year for CPE 06/2017 Pt seen acutely in 06/2017, no CPE appt on the schedule.   Refill request for Ibuprofen 800mg  Last filled 09/02/17, #60

## 2017-11-16 ENCOUNTER — Other Ambulatory Visit: Payer: Self-pay | Admitting: Internal Medicine

## 2017-11-24 ENCOUNTER — Other Ambulatory Visit: Payer: Self-pay | Admitting: Internal Medicine

## 2017-11-25 NOTE — Telephone Encounter (Signed)
Sent in electronically .  

## 2017-12-02 LAB — HM MAMMOGRAPHY

## 2017-12-08 ENCOUNTER — Other Ambulatory Visit: Payer: Self-pay | Admitting: Internal Medicine

## 2017-12-25 ENCOUNTER — Other Ambulatory Visit: Payer: Self-pay | Admitting: Internal Medicine

## 2017-12-29 ENCOUNTER — Encounter: Payer: Self-pay | Admitting: Internal Medicine

## 2018-01-04 ENCOUNTER — Other Ambulatory Visit: Payer: Self-pay | Admitting: Internal Medicine

## 2018-01-27 ENCOUNTER — Other Ambulatory Visit: Payer: Self-pay | Admitting: Internal Medicine

## 2018-01-29 NOTE — Telephone Encounter (Signed)
Xanax last filled 11/2017 Last OV 06/2017 Acute visit Last OV 01/2017 Please advise Dr Regis Bill, thanks.

## 2018-01-29 NOTE — Telephone Encounter (Signed)
Please have her make   cpx or yearly med check  visit    Then can refill each med  X 1

## 2018-02-01 NOTE — Telephone Encounter (Signed)
Please see my answer    thanks

## 2018-02-02 ENCOUNTER — Other Ambulatory Visit: Payer: Self-pay | Admitting: Internal Medicine

## 2018-02-02 DIAGNOSIS — G4700 Insomnia, unspecified: Secondary | ICD-10-CM

## 2018-02-02 DIAGNOSIS — Z0000 Encounter for general adult medical examination without abnormal findings: Secondary | ICD-10-CM

## 2018-02-02 DIAGNOSIS — Z79899 Other long term (current) drug therapy: Secondary | ICD-10-CM

## 2018-02-02 DIAGNOSIS — Z79890 Hormone replacement therapy (postmenopausal): Secondary | ICD-10-CM

## 2018-02-02 NOTE — Progress Notes (Signed)
orders place for patient to gegt pre cpx visit

## 2018-02-02 NOTE — Progress Notes (Signed)
noted 

## 2018-02-02 NOTE — Telephone Encounter (Signed)
Pt requests 90-day Estrogen Rx  Pt is requesting her Xanax be filled ASAP  Pt is scheduled for OV for CPE with Dr Regis Bill 10/22 Pt is requesting FASTING LABS prior to visit - okay per Dr Regis Bill. Lab appt scheduled on 02/16/18 at 10am  Will send to Dr Regis Bill for medication refills and lab orders for upcoming fasting lab appt.

## 2018-02-16 ENCOUNTER — Other Ambulatory Visit (INDEPENDENT_AMBULATORY_CARE_PROVIDER_SITE_OTHER): Payer: 59

## 2018-02-16 DIAGNOSIS — G4700 Insomnia, unspecified: Secondary | ICD-10-CM | POA: Diagnosis not present

## 2018-02-16 DIAGNOSIS — Z0000 Encounter for general adult medical examination without abnormal findings: Secondary | ICD-10-CM | POA: Diagnosis not present

## 2018-02-16 DIAGNOSIS — Z79899 Other long term (current) drug therapy: Secondary | ICD-10-CM

## 2018-02-16 DIAGNOSIS — Z79890 Hormone replacement therapy (postmenopausal): Secondary | ICD-10-CM | POA: Diagnosis not present

## 2018-02-16 LAB — HEPATIC FUNCTION PANEL
ALBUMIN: 4.2 g/dL (ref 3.5–5.2)
ALT: 11 U/L (ref 0–35)
AST: 15 U/L (ref 0–37)
Alkaline Phosphatase: 42 U/L (ref 39–117)
Bilirubin, Direct: 0.1 mg/dL (ref 0.0–0.3)
TOTAL PROTEIN: 6.4 g/dL (ref 6.0–8.3)
Total Bilirubin: 0.5 mg/dL (ref 0.2–1.2)

## 2018-02-16 LAB — CBC WITH DIFFERENTIAL/PLATELET
BASOS ABS: 0 10*3/uL (ref 0.0–0.1)
Basophils Relative: 0.7 % (ref 0.0–3.0)
EOS ABS: 0.1 10*3/uL (ref 0.0–0.7)
EOS PCT: 2.2 % (ref 0.0–5.0)
HCT: 40.5 % (ref 36.0–46.0)
HEMOGLOBIN: 13.9 g/dL (ref 12.0–15.0)
Lymphocytes Relative: 28.8 % (ref 12.0–46.0)
Lymphs Abs: 1.2 10*3/uL (ref 0.7–4.0)
MCHC: 34.2 g/dL (ref 30.0–36.0)
MCV: 87.1 fl (ref 78.0–100.0)
MONO ABS: 0.3 10*3/uL (ref 0.1–1.0)
Monocytes Relative: 6.8 % (ref 3.0–12.0)
Neutro Abs: 2.6 10*3/uL (ref 1.4–7.7)
Neutrophils Relative %: 61.5 % (ref 43.0–77.0)
Platelets: 220 10*3/uL (ref 150.0–400.0)
RBC: 4.66 Mil/uL (ref 3.87–5.11)
RDW: 12.6 % (ref 11.5–15.5)
WBC: 4.2 10*3/uL (ref 4.0–10.5)

## 2018-02-16 LAB — BASIC METABOLIC PANEL
BUN: 19 mg/dL (ref 6–23)
CO2: 27 meq/L (ref 19–32)
Calcium: 8.9 mg/dL (ref 8.4–10.5)
Chloride: 104 mEq/L (ref 96–112)
Creatinine, Ser: 0.77 mg/dL (ref 0.40–1.20)
GFR: 81.01 mL/min (ref >60.00)
GLUCOSE: 93 mg/dL (ref 70–99)
POTASSIUM: 4.6 meq/L (ref 3.5–5.1)
SODIUM: 138 meq/L (ref 135–145)

## 2018-02-16 LAB — LIPID PANEL
Cholesterol: 148 mg/dL (ref 0–200)
HDL: 40.8 mg/dL (ref >39.00)
LDL Cholesterol: 98 mg/dL (ref 0–99)
NONHDL: 107.44
TRIGLYCERIDES: 48 mg/dL (ref 0.0–149.0)
Total CHOL/HDL Ratio: 4
VLDL: 9.6 mg/dL (ref 0.0–40.0)

## 2018-02-16 LAB — HEMOGLOBIN A1C: Hgb A1c MFr Bld: 5.2 % (ref 4.6–6.5)

## 2018-02-16 LAB — TSH: TSH: 2.14 u[IU]/mL (ref 0.35–4.50)

## 2018-02-24 ENCOUNTER — Other Ambulatory Visit: Payer: Self-pay | Admitting: Internal Medicine

## 2018-03-02 ENCOUNTER — Ambulatory Visit (INDEPENDENT_AMBULATORY_CARE_PROVIDER_SITE_OTHER): Payer: 59 | Admitting: Internal Medicine

## 2018-03-02 ENCOUNTER — Encounter: Payer: Self-pay | Admitting: Internal Medicine

## 2018-03-02 VITALS — BP 124/70 | HR 69 | Temp 97.7°F | Ht 63.25 in | Wt 152.0 lb

## 2018-03-02 DIAGNOSIS — Z1159 Encounter for screening for other viral diseases: Secondary | ICD-10-CM | POA: Diagnosis not present

## 2018-03-02 DIAGNOSIS — Z23 Encounter for immunization: Secondary | ICD-10-CM

## 2018-03-02 DIAGNOSIS — Z8349 Family history of other endocrine, nutritional and metabolic diseases: Secondary | ICD-10-CM | POA: Diagnosis not present

## 2018-03-02 DIAGNOSIS — Z79899 Other long term (current) drug therapy: Secondary | ICD-10-CM | POA: Diagnosis not present

## 2018-03-02 DIAGNOSIS — Z0000 Encounter for general adult medical examination without abnormal findings: Secondary | ICD-10-CM | POA: Diagnosis not present

## 2018-03-02 DIAGNOSIS — G4700 Insomnia, unspecified: Secondary | ICD-10-CM

## 2018-03-02 DIAGNOSIS — H903 Sensorineural hearing loss, bilateral: Secondary | ICD-10-CM

## 2018-03-02 NOTE — Patient Instructions (Signed)
Please get back with counselor  About the recurrence of  ptsd sx.   Checking iron studies and hepatitis c screen today.   Your blood pressure is good today.  consider shingrix vaccine  When you are ready    Health Maintenance, Female Adopting a healthy lifestyle and getting preventive care can go a long way to promote health and wellness. Talk with your health care provider about what schedule of regular examinations is right for you. This is a good chance for you to check in with your provider about disease prevention and staying healthy. In between checkups, there are plenty of things you can do on your own. Experts have done a lot of research about which lifestyle changes and preventive measures are most likely to keep you healthy. Ask your health care provider for more information. Weight and diet Eat a healthy diet  Be sure to include plenty of vegetables, fruits, low-fat dairy products, and lean protein.  Do not eat a lot of foods high in solid fats, added sugars, or salt.  Get regular exercise. This is one of the most important things you can do for your health. ? Most adults should exercise for at least 150 minutes each week. The exercise should increase your heart rate and make you sweat (moderate-intensity exercise). ? Most adults should also do strengthening exercises at least twice a week. This is in addition to the moderate-intensity exercise.  Maintain a healthy weight  Body mass index (BMI) is a measurement that can be used to identify possible weight problems. It estimates body fat based on height and weight. Your health care provider can help determine your BMI and help you achieve or maintain a healthy weight.  For females 79 years of age and older: ? A BMI below 18.5 is considered underweight. ? A BMI of 18.5 to 24.9 is normal. ? A BMI of 25 to 29.9 is considered overweight. ? A BMI of 30 and above is considered obese.  Watch levels of cholesterol and blood  lipids  You should start having your blood tested for lipids and cholesterol at 61 years of age, then have this test every 5 years.  You may need to have your cholesterol levels checked more often if: ? Your lipid or cholesterol levels are high. ? You are older than 61 years of age. ? You are at high risk for heart disease.  Cancer screening Lung Cancer  Lung cancer screening is recommended for adults 44-54 years old who are at high risk for lung cancer because of a history of smoking.  A yearly low-dose CT scan of the lungs is recommended for people who: ? Currently smoke. ? Have quit within the past 15 years. ? Have at least a 30-pack-year history of smoking. A pack year is smoking an average of one pack of cigarettes a day for 1 year.  Yearly screening should continue until it has been 15 years since you quit.  Yearly screening should stop if you develop a health problem that would prevent you from having lung cancer treatment.  Breast Cancer  Practice breast self-awareness. This means understanding how your breasts normally appear and feel.  It also means doing regular breast self-exams. Let your health care provider know about any changes, no matter how small.  If you are in your 20s or 30s, you should have a clinical breast exam (CBE) by a health care provider every 1-3 years as part of a regular health exam.  If you are  12 or older, have a CBE every year. Also consider having a breast X-ray (mammogram) every year.  If you have a family history of breast cancer, talk to your health care provider about genetic screening.  If you are at high risk for breast cancer, talk to your health care provider about having an MRI and a mammogram every year.  Breast cancer gene (BRCA) assessment is recommended for women who have family members with BRCA-related cancers. BRCA-related cancers include: ? Breast. ? Ovarian. ? Tubal. ? Peritoneal cancers.  Results of the assessment will  determine the need for genetic counseling and BRCA1 and BRCA2 testing.  Cervical Cancer Your health care provider may recommend that you be screened regularly for cancer of the pelvic organs (ovaries, uterus, and vagina). This screening involves a pelvic examination, including checking for microscopic changes to the surface of your cervix (Pap test). You may be encouraged to have this screening done every 3 years, beginning at age 85.  For women ages 47-65, health care providers may recommend pelvic exams and Pap testing every 3 years, or they may recommend the Pap and pelvic exam, combined with testing for human papilloma virus (HPV), every 5 years. Some types of HPV increase your risk of cervical cancer. Testing for HPV may also be done on women of any age with unclear Pap test results.  Other health care providers may not recommend any screening for nonpregnant women who are considered low risk for pelvic cancer and who do not have symptoms. Ask your health care provider if a screening pelvic exam is right for you.  If you have had past treatment for cervical cancer or a condition that could lead to cancer, you need Pap tests and screening for cancer for at least 20 years after your treatment. If Pap tests have been discontinued, your risk factors (such as having a new sexual partner) need to be reassessed to determine if screening should resume. Some women have medical problems that increase the chance of getting cervical cancer. In these cases, your health care provider may recommend more frequent screening and Pap tests.  Colorectal Cancer  This type of cancer can be detected and often prevented.  Routine colorectal cancer screening usually begins at 61 years of age and continues through 61 years of age.  Your health care provider may recommend screening at an earlier age if you have risk factors for colon cancer.  Your health care provider may also recommend using home test kits to check  for hidden blood in the stool.  A small camera at the end of a tube can be used to examine your colon directly (sigmoidoscopy or colonoscopy). This is done to check for the earliest forms of colorectal cancer.  Routine screening usually begins at age 67.  Direct examination of the colon should be repeated every 5-10 years through 61 years of age. However, you may need to be screened more often if early forms of precancerous polyps or small growths are found.  Skin Cancer  Check your skin from head to toe regularly.  Tell your health care provider about any new moles or changes in moles, especially if there is a change in a mole's shape or color.  Also tell your health care provider if you have a mole that is larger than the size of a pencil eraser.  Always use sunscreen. Apply sunscreen liberally and repeatedly throughout the day.  Protect yourself by wearing long sleeves, pants, a wide-brimmed hat, and sunglasses whenever you  are outside.  Heart disease, diabetes, and high blood pressure  High blood pressure causes heart disease and increases the risk of stroke. High blood pressure is more likely to develop in: ? People who have blood pressure in the high end of the normal range (130-139/85-89 mm Hg). ? People who are overweight or obese. ? People who are African American.  If you are 77-66 years of age, have your blood pressure checked every 3-5 years. If you are 46 years of age or older, have your blood pressure checked every year. You should have your blood pressure measured twice-once when you are at a hospital or clinic, and once when you are not at a hospital or clinic. Record the average of the two measurements. To check your blood pressure when you are not at a hospital or clinic, you can use: ? An automated blood pressure machine at a pharmacy. ? A home blood pressure monitor.  If you are between 37 years and 59 years old, ask your health care provider if you should take  aspirin to prevent strokes.  Have regular diabetes screenings. This involves taking a blood sample to check your fasting blood sugar level. ? If you are at a normal weight and have a low risk for diabetes, have this test once every three years after 61 years of age. ? If you are overweight and have a high risk for diabetes, consider being tested at a younger age or more often. Preventing infection Hepatitis B  If you have a higher risk for hepatitis B, you should be screened for this virus. You are considered at high risk for hepatitis B if: ? You were born in a country where hepatitis B is common. Ask your health care provider which countries are considered high risk. ? Your parents were born in a high-risk country, and you have not been immunized against hepatitis B (hepatitis B vaccine). ? You have HIV or AIDS. ? You use needles to inject street drugs. ? You live with someone who has hepatitis B. ? You have had sex with someone who has hepatitis B. ? You get hemodialysis treatment. ? You take certain medicines for conditions, including cancer, organ transplantation, and autoimmune conditions.  Hepatitis C  Blood testing is recommended for: ? Everyone born from [birth year] through 1964. ? Anyone with known risk factors for hepatitis C.  Sexually transmitted infections (STIs)  You should be screened for sexually transmitted infections (STIs) including gonorrhea and chlamydia if: ? You are sexually active and are younger than 61 years of age. ? You are older than 61 years of age and your health care provider tells you that you are at risk for this type of infection. ? Your sexual activity has changed since you were last screened and you are at an increased risk for chlamydia or gonorrhea. Ask your health care provider if you are at risk.  If you do not have HIV, but are at risk, it may be recommended that you take a prescription medicine daily to prevent HIV infection. This is called  pre-exposure prophylaxis (PrEP). You are considered at risk if: ? You are sexually active and do not regularly use condoms or know the HIV status of your partner(s). ? You take drugs by injection. ? You are sexually active with a partner who has HIV.  Talk with your health care provider about whether you are at high risk of being infected with HIV. If you choose to begin PrEP, you should first be  tested for HIV. You should then be tested every 3 months for as long as you are taking PrEP. Pregnancy  If you are premenopausal and you may become pregnant, ask your health care provider about preconception counseling.  If you may become pregnant, take 400 to 800 micrograms (mcg) of folic acid every day.  If you want to prevent pregnancy, talk to your health care provider about birth control (contraception). Osteoporosis and menopause  Osteoporosis is a disease in which the bones lose minerals and strength with aging. This can result in serious bone fractures. Your risk for osteoporosis can be identified using a bone density scan.  If you are 35 years of age or older, or if you are at risk for osteoporosis and fractures, ask your health care provider if you should be screened.  Ask your health care provider whether you should take a calcium or vitamin D supplement to lower your risk for osteoporosis.  Menopause may have certain physical symptoms and risks.  Hormone replacement therapy may reduce some of these symptoms and risks. Talk to your health care provider about whether hormone replacement therapy is right for you. Follow these instructions at home:  Schedule regular health, dental, and eye exams.  Stay current with your immunizations.  Do not use any tobacco products including cigarettes, chewing tobacco, or electronic cigarettes.  If you are pregnant, do not drink alcohol.  If you are breastfeeding, limit how much and how often you drink alcohol.  Limit alcohol intake to no more  than 1 drink per day for nonpregnant women. One drink equals 12 ounces of beer, 5 ounces of wine, or 1 ounces of hard liquor.  Do not use street drugs.  Do not share needles.  Ask your health care provider for help if you need support or information about quitting drugs.  Tell your health care provider if you often feel depressed.  Tell your health care provider if you have ever been abused or do not feel safe at home. This information is not intended to replace advice given to you by your health care provider. Make sure you discuss any questions you have with your health care provider. Document Released: 11/11/2010 Document Revised: 10/04/2015 Document Reviewed: 01/30/2015 [employer] Interactive Patient Education  [employer].

## 2018-03-02 NOTE — Progress Notes (Signed)
Chief Complaint  Patient presents with  . Annual Exam    flu shot, no new concerns    HPI: Patient  Karen Spears  62 y.o. comes in today for Preventive Health Care visit  No recent     resp sx   But feeling getting claustrophobic  ? Some ptsd  Feeling again.   remote hx of med ( bp and counseling)   Has weaned dow on alprazz and only taking 1/2 at a time   (  hrt patch  continuing   Son dx with hemochromatosis    Sx of weight loss and  Joint  Ms pain .    Hard to lose weight    Health Maintenance  Topic Date Due  . Hepatitis C Screening  05-17-1955  . HIV Screening  02/28/1972  . INFLUENZA VACCINE  03/02/2018 (Originally 12/10/2017)  . TETANUS/TDAP  09/20/2018  . MAMMOGRAM  12/03/2019  . COLONOSCOPY  03/06/2026   Health Maintenance Review LIFESTYLE:  Exercise:   Walking  Active  Tobacco/ETS:n Alcohol: n mon Sugar beverages:ocass Sleep:interrupted awakesa at 5 am  Drug use: no HH of 2 Work: ft husband to retire soon   ROS:  Hearing   Aids  GEN/ HEENT: No fever, significant weight changes sweats headaches vision problems hearing changes, CV/ PULM; No chest pain shortness of breath cough, syncope,edema  change in exercise tolerance. GI /GU: No adominal pain, vomiting, change in bowel habits. No blood in the stool. No significant GU symptoms. SKIN/HEME: ,no acute skin rashes suspicious lesions or bleeding. No lymphadenopathy, nodules, masses.  NEURO/ PSYCH:  No neurologic signs such as weakness numbness. No depression anxiety. IMM/ Allergy: No unusual infections.  Allergy .   REST of 12 system review negative except as per HPI   Past Medical History:  Diagnosis Date  . Allergic rhinitis   . Allergy   . Anxiety   . Arthritis    hips  . Asthmatic bronchitis 07/30/2012  . Bladder polyps    and stones dr Amalia Hailey   . Bronchospasm 08/20/2012   Much improved on steroid inhaler and removal from old the house. At this time hold on specialty referral consider PFTs  in the future maintain on Symbicort for n   . Chronic hip pain    bursitis and arthritis  . Drug rash 05/13/2011   ? Fixed rash  From diflucan  ?   Marland Kitchen Exposure to mold 07/30/2012  . Female pelvic pain    after hysterectomy ileoinguinal nerve 2/08 saw Dr Brien Few [facility] in the past  . GERD (gastroesophageal reflux disease)    prilosec prn only  . Headache(784.0)   . Hypertension   . MIGRAINE HEADACHE 03/29/2009   Qualifier: Diagnosis of  By: Regis Bill MD, Standley Brooking   . Migraines   . Nerve damage    pelvic floor  . Neuromuscular disorder (HCC)    hips, pelvic floor   . Reaction to severe stress 12/31/2011    Past Surgical History:  Procedure Laterality Date  . ABDOMINAL HYSTERECTOMY    . CESAREAN SECTION  1985  . vocal cord surgery  1987  . WISDOM TOOTH EXTRACTION      Family History  Problem Relation Age of Onset  . Gallstones Mother   . Colon polyps Mother   . Gallstones Sister   . Bipolar disorder Unknown        child  . Colon polyps Father   . Colon cancer Neg Hx   .  Esophageal cancer Neg Hx   . Rectal cancer Neg Hx   . Stomach cancer Neg Hx     Social History   Socioeconomic History  . Marital status: Married    Spouse name: Not on file  . Number of children: Not on file  . Years of education: Not on file  . Highest education level: Not on file  Occupational History  . Not on file  Social Needs  . Financial resource strain: Not on file  . Food insecurity:    Worry: Not on file    Inability: Not on file  . Transportation needs:    Medical: Not on file    Non-medical: Not on file  Tobacco Use  . Smoking status: Never Smoker  . Smokeless tobacco: Never Used  Substance and Sexual Activity  . Alcohol use: No    Alcohol/week: 0.0 standard drinks  . Drug use: No  . Sexual activity: Not on file  Lifestyle  . Physical activity:    Days per week: Not on file    Minutes per session: Not on file  . Stress: Not on file  Relationships  . Social connections:     Talks on phone: Not on file    Gets together: Not on file    Attends religious service: Not on file    Active member of club or organization: Not on file    Attends meetings of clubs or organizations: Not on file    Relationship status: Not on file  Other Topics Concern  . 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    Has parish church    Married   Regular exercise- no some   Has grandchildren   Hs farm property [place] house   A child is bipolar          Outpatient Medications Prior to Visit  Medication Sig Dispense Refill  . albuterol (PROVENTIL HFA;VENTOLIN HFA) 108 (90 Base) MCG/ACT inhaler Inhale 1-2 puffs into the lungs every 6 (six) hours as needed for wheezing or shortness of breath. 1 Inhaler 2  . ALPRAZolam (XANAX) 0.25 MG tablet TAKE 1 AND 1/2 TABLETS BY MOUTH EVERY NIGHT AT BEDTIME AS NEEDED 45 tablet 0  . budesonide-formoterol (SYMBICORT) 160-4.5 MCG/ACT inhaler Inhale 2 puffs into the lungs 2 (two) times daily. 1 Inhaler 3  . doxycycline (VIBRA-TABS) 100 MG tablet Take 1 tablet (100 mg total) by mouth 2 (two) times daily. If needed for sinusitis 14 tablet 0  . estradiol (VIVELLE-DOT) 0.1 MG/24HR patch UNWRAP AND APPLY 1 PATCH TO THE SKIN TWICE A WEEK 24 patch 0  . ibuprofen (ADVIL,MOTRIN) 800 MG tablet TAKE 1 TABLET BY MOUTH TWICE DAILY AS NEEDED FOR PAIN 60 tablet 0  . ketoprofen (ORUDIS) 50 MG capsule Take 1 capsule (50 mg total) by mouth 4 (four) times daily as needed. 30 capsule 0  . lidocaine (LIDODERM) 5 % Place 1 patch onto the skin as directed. Remove & Discard patch within 12 hours or as directed by MD 30 patch 1  . metoprolol succinate (TOPROL-XL) 50 MG 24 hr tablet TAKE 1/2 TABLET BY MOUTH TWICE DAILY AS DIRECTED 30 tablet 0  . rizatriptan (MAXALT) 10 MG tablet Take 1 tablet (10 mg total) by mouth as needed. May repeat in 2 hours if needed 10 tablet 0  . terconazole (TERAZOL 3)  0.8 % vaginal cream Place 1 applicator vaginally at bedtime. 20 g 2  .  promethazine (PHENERGAN) 25 MG suppository Place 1 suppository (25 mg total) rectally every 6 (six) hours as needed for up to 7 days for nausea. 6 each 0   No facility-administered medications prior to visit.      EXAM:  BP 124/70 (BP Location: Left Arm, Patient Position: Sitting, Cuff Size: Normal)   Pulse 69   Temp 97.7 F (36.5 C) (Oral)   Ht 5' 3.25" (1.607 m)   Wt 152 lb (68.9 kg)   SpO2 98%   BMI 26.71 kg/m   Body mass index is 26.71 kg/m. Wt Readings from Last 3 Encounters:  03/02/18 152 lb (68.9 kg)  06/30/17 150 lb 9.6 oz (68.3 kg)  01/28/17 148 lb 9.6 oz (67.4 kg)    Physical Exam: Vital signs reviewed HDQ:QIWL is a well-developed well-nourished alert cooperative    who appearsr stated age in no acute distress.  HEENT: normocephalic atraumatic , Eyes: PERRL EOM's full, conjunctiva clear, Nares: paten,t no deformity discharge or tenderness., Ears: no deformity EAC's clear TMs with normal landmarks. Mouth: clear OP, no lesions, edema.  Moist mucous membranes. Dentition in adequate repair. NECK: supple without masses, thyromegaly or bruits. CHEST/PULM:  Clear to auscultation and percussion breath sounds equal no wheeze , rales or rhonchi. No chest wall deformities or tenderness. Breast: normal by inspection . No dimpling, discharge, masses, tenderness or discharge . CV: PMI is nondisplaced, S1 S2 no gallops, murmurs, rubs. Peripheral pulses are full without delay.No JVD .  ABDOMEN: Bowel sounds normal nontender  No guard or rebound, no hepato splenomegal no CVA tenderness.   Extremtities:  No clubbing cyanosis or edema, no acute joint swelling or redness no focal atrophy NEURO:  Oriented x3, cranial nerves 3-12 appear to be intact, no obvious focal weakness,gait within normal limits no abnormal reflexes or asymmetrical SKIN: No acute rashes normal turgor, color, no bruising or petechiae. PSYCH:  Oriented, good eye contact, no obvious depression anxiety, cognition and judgment appear normal. LN: no cervical axillary  adenopathy  Lab Results  Component Value Date   WBC 4.2 02/16/2018   HGB 13.9 02/16/2018   HCT 40.5 02/16/2018   PLT 220.0 02/16/2018   GLUCOSE 93 02/16/2018   CHOL 148 02/16/2018   TRIG 48.0 02/16/2018   HDL 40.80 02/16/2018   LDLCALC 98 02/16/2018   ALT 11 02/16/2018   AST 15 02/16/2018   NA 138 02/16/2018   K 4.6 02/16/2018   CL 104 02/16/2018   CREATININE 0.77 02/16/2018   BUN 19 02/16/2018   CO2 27 02/16/2018   TSH 2.14 02/16/2018   HGBA1C 5.2 02/16/2018    BP Readings from Last 3 Encounters:  03/02/18 124/70  06/30/17 132/88  01/28/17 128/70    Lab results reviewed with patient    ASSESSMENT AND PLAN:  Discussed the following assessment and plan:  Visit for preventive health examination  Need for immunization against influenza - Plan: Flu Vaccine QUAD 36+ mos IM  Family history of hemochromatosis - Plan: Iron, TIBC and Ferritin Panel, Hemochromatosis DNA-PCR(c282y,h63d)  Medication management - Plan: Iron, TIBC and Ferritin Panel, Hemochromatosis DNA-PCR(c282y,h63d)  Need for hepatitis C screening test - Plan: Hepatitis C antibody  Bilateral sensorineural hearing loss  Insomnia, unspecified type Hx of ptsd and some sx coming back  Get back in counseling    Has dec the alpraz and taking 1/2 before performance and as needed hx    ROV  in 6 mos  Med eval   Patient Care Team: Burnis Medin, MD  as PCP - General Patient Instructions  Please get back with counselor  About the recurrence of  ptsd sx.   Checking iron studies and hepatitis c screen today.   Your blood pressure is good today.  consider shingrix vaccine  When you are ready    Health Maintenance, Female Adopting a healthy lifestyle and getting preventive care can go a long way to promote health and wellness. Talk with your health care provider about what schedule of  regular examinations is right for you. This is a good chance for you to check in with your provider about disease prevention and staying healthy. In between checkups, there are plenty of things you can do on your own. Experts have done a lot of research about which lifestyle changes and preventive measures are most likely to keep you healthy. Ask your health care provider for more information. Weight and diet Eat a healthy diet  Be sure to include plenty of vegetables, fruits, low-fat dairy products, and lean protein.  Do not eat a lot of foods high in solid fats, added sugars, or salt.  Get regular exercise. This is one of the most important things you can do for your health. ? Most adults should exercise for at least 150 minutes each week. The exercise should increase your heart rate and make you sweat (moderate-intensity exercise). ? Most adults should also do strengthening exercises at least twice a week. This is in addition to the moderate-intensity exercise.  Maintain a healthy weight  Body mass index (BMI) is a measurement that can be used to identify possible weight problems. It estimates body fat based on height and weight. Your health care provider can help determine your BMI and help you achieve or maintain a healthy weight.  For females 50 years of age and older: ? A BMI below 18.5 is considered underweight. ? A BMI of 18.5 to 24.9 is normal. ? A BMI of 25 to 29.9 is considered overweight. ? A BMI of 30 and above is considered obese.  Watch levels of cholesterol and blood lipids  You should start having your blood tested for lipids and cholesterol at 61 years of age, then have this test every 5 years.  You may need to have your cholesterol levels checked more often if: ? Your lipid or cholesterol levels are high. ? You are older than 61 years of age. ? You are at high risk for heart disease.  Cancer screening Lung Cancer  Lung cancer screening is recommended for adults  31-73 years old who are at high risk for lung cancer because of a history of smoking.  A yearly low-dose CT scan of the lungs is recommended for people who: ? Currently smoke. ? Have quit within the past 15 years. ? Have at least a 30-pack-year history of smoking. A pack year is smoking an average of one pack of cigarettes a day for 1 year.  Yearly screening should continue until it has been 15 years since you quit.  Yearly screening should stop if you develop a health problem that would prevent you from having lung cancer treatment.  Breast Cancer  Practice breast self-awareness. This means understanding how your breasts normally appear and feel.  It also means doing regular breast self-exams. Let your health care provider know about any changes, no matter how small.  If you are in your 20s or 30s, you should have a clinical breast exam (CBE) by a health care provider every 1-3 years as part of  a regular health exam.  If you are 83 or older, have a CBE every year. Also consider having a breast X-ray (mammogram) every year.  If you have a family history of breast cancer, talk to your health care provider about genetic screening.  If you are at high risk for breast cancer, talk to your health care provider about having an MRI and a mammogram every year.  Breast cancer gene (BRCA) assessment is recommended for women who have family members with BRCA-related cancers. BRCA-related cancers include: ? Breast. ? Ovarian. ? Tubal. ? Peritoneal cancers.  Results of the assessment will determine the need for genetic counseling and BRCA1 and BRCA2 testing.  Cervical Cancer Your health care provider may recommend that you be screened regularly for cancer of the pelvic organs (ovaries, uterus, and vagina). This screening involves a pelvic examination, including checking for microscopic changes to the surface of your cervix (Pap test). You may be encouraged to have this screening done every 3  years, beginning at age 76.  For women ages 36-65, health care providers may recommend pelvic exams and Pap testing every 3 years, or they may recommend the Pap and pelvic exam, combined with testing for human papilloma virus (HPV), every 5 years. Some types of HPV increase your risk of cervical cancer. Testing for HPV may also be done on women of any age with unclear Pap test results.  Other health care providers may not recommend any screening for nonpregnant women who are considered low risk for pelvic cancer and who do not have symptoms. Ask your health care provider if a screening pelvic exam is right for you.  If you have had past treatment for cervical cancer or a condition that could lead to cancer, you need Pap tests and screening for cancer for at least 20 years after your treatment. If Pap tests have been discontinued, your risk factors (such as having a new sexual partner) need to be reassessed to determine if screening should resume. Some women have medical problems that increase the chance of getting cervical cancer. In these cases, your health care provider may recommend more frequent screening and Pap tests.  Colorectal Cancer  This type of cancer can be detected and often prevented.  Routine colorectal cancer screening usually begins at 61 years of age and continues through 61 years of age.  Your health care provider may recommend screening at an earlier age if you have risk factors for colon cancer.  Your health care provider may also recommend using home test kits to check for hidden blood in the stool.  A small camera at the end of a tube can be used to examine your colon directly (sigmoidoscopy or colonoscopy). This is done to check for the earliest forms of colorectal cancer.  Routine screening usually begins at age 53.  Direct examination of the colon should be repeated every 5-10 years through 61 years of age. However, you may need to be screened more often if early  forms of precancerous polyps or small growths are found.  Skin Cancer  Check your skin from head to toe regularly.  Tell your health care provider about any new moles or changes in moles, especially if there is a change in a mole's shape or color.  Also tell your health care provider if you have a mole that is larger than the size of a pencil eraser.  Always use sunscreen. Apply sunscreen liberally and repeatedly throughout the day.  Protect yourself by wearing long sleeves,  pants, a wide-brimmed hat, and sunglasses whenever you are outside.  Heart disease, diabetes, and high blood pressure  High blood pressure causes heart disease and increases the risk of stroke. High blood pressure is more likely to develop in: ? People who have blood pressure in the high end of the normal range (130-139/85-89 mm Hg). ? People who are overweight or obese. ? People who are African American.  If you are 47-88 years of age, have your blood pressure checked every 3-5 years. If you are 61 years of age or older, have your blood pressure checked every year. You should have your blood pressure measured twice-once when you are at a hospital or clinic, and once when you are not at a hospital or clinic. Record the average of the two measurements. To check your blood pressure when you are not at a hospital or clinic, you can use: ? An automated blood pressure machine at a pharmacy. ? A home blood pressure monitor.  If you are between 33 years and 32 years old, ask your health care provider if you should take aspirin to prevent strokes.  Have regular diabetes screenings. This involves taking a blood sample to check your fasting blood sugar level. ? If you are at a normal weight and have a low risk for diabetes, have this test once every three years after 61 years of age. ? If you are overweight and have a high risk for diabetes, consider being tested at a younger age or more often. Preventing infection Hepatitis  B  If you have a higher risk for hepatitis B, you should be screened for this virus. You are considered at high risk for hepatitis B if: ? You were born in a country where hepatitis B is common. Ask your health care provider which countries are considered high risk. ? Your parents were born in a high-risk country, and you have not been immunized against hepatitis B (hepatitis B vaccine). ? You have HIV or AIDS. ? You use needles to inject street drugs. ? You live with someone who has hepatitis B. ? You have had sex with someone who has hepatitis B. ? You get hemodialysis treatment. ? You take certain medicines for conditions, including cancer, organ transplantation, and autoimmune conditions.  Hepatitis C  Blood testing is recommended for: ? Everyone born from [birth year] through 1964. ? Anyone with known risk factors for hepatitis C.  Sexually transmitted infections (STIs)  You should be screened for sexually transmitted infections (STIs) including gonorrhea and chlamydia if: ? You are sexually active and are younger than 61 years of age. ? You are older than 61 years of age and your health care provider tells you that you are at risk for this type of infection. ? Your sexual activity has changed since you were last screened and you are at an increased risk for chlamydia or gonorrhea. Ask your health care provider if you are at risk.  If you do not have HIV, but are at risk, it may be recommended that you take a prescription medicine daily to prevent HIV infection. This is called pre-exposure prophylaxis (PrEP). You are considered at risk if: ? You are sexually active and do not regularly use condoms or know the HIV status of your partner(s). ? You take drugs by injection. ? You are sexually active with a partner who has HIV.  Talk with your health care provider about whether you are at high risk of being infected with HIV. If you choose  to begin PrEP, you should first be tested for HIV. You  should then be tested every 3 months for as long as you are taking PrEP. Pregnancy  If you are premenopausal and you may become pregnant, ask your health care provider about preconception counseling.  If you may become pregnant, take 400 to 800 micrograms (mcg) of folic acid every day.  If you want to prevent pregnancy, talk to your health care provider about birth control (contraception). Osteoporosis and menopause  Osteoporosis is a disease in which the bones lose minerals and strength with aging. This can result in serious bone fractures. Your risk for osteoporosis can be identified using a bone density scan.  If you are 6 years of age or older, or if you are at risk for osteoporosis and fractures, ask your health care provider if you should be screened.  Ask your health care provider whether you should take a calcium or vitamin D supplement to lower your risk for osteoporosis.  Menopause may have certain physical symptoms and risks.  Hormone replacement therapy may reduce some of these symptoms and risks. Talk to your health care provider about whether hormone replacement therapy is right for you. Follow these instructions at home:  Schedule regular health, dental, and eye exams.  Stay current with your immunizations.  Do not use any tobacco products including cigarettes, chewing tobacco, or electronic cigarettes.  If you are pregnant, do not drink alcohol.  If you are breastfeeding, limit how much and how often you drink alcohol.  Limit alcohol intake to no more than 1 drink per day for nonpregnant women. One drink equals 12 ounces of beer, 5 ounces of wine, or 1 ounces of hard liquor.  Do not use street drugs.  Do not share needles.  Ask your health care provider for help if you need support or information about quitting drugs.  Tell your health care provider if you often feel depressed.  Tell your health care provider if you have ever been abused or do not feel safe  at home. This information is not intended to replace advice given to you by your health care provider. Make sure you discuss any questions you have with your health care provider. Document Released: 11/11/2010 Document Revised: 10/04/2015 Document Reviewed: 01/30/2015 Elsevier Interactive Patient Education  2018 Wallace. Panosh M.D.

## 2018-03-03 LAB — IRON,TIBC AND FERRITIN PANEL
%SAT: 33 % (ref 16–45)
Ferritin: 157 ng/mL (ref 16–288)
Iron: 93 ug/dL (ref 45–160)
TIBC: 286 ug/dL (ref 250–450)

## 2018-03-04 ENCOUNTER — Ambulatory Visit: Payer: 59 | Admitting: Psychiatry

## 2018-03-04 DIAGNOSIS — F4310 Post-traumatic stress disorder, unspecified: Secondary | ICD-10-CM

## 2018-03-04 DIAGNOSIS — F3340 Major depressive disorder, recurrent, in remission, unspecified: Secondary | ICD-10-CM

## 2018-03-04 DIAGNOSIS — F411 Generalized anxiety disorder: Secondary | ICD-10-CM

## 2018-03-04 NOTE — Progress Notes (Signed)
[facility] Counselor/Therapist Progress Note   Patient ID: Karen Spears, MRN: [id number]  Date: 03/04/2018  Start time: 2:20p Stop time: 3:30p Time Spent: 70 min  Treatment Type: Individual  Subjective:  Directed back to Karen Spears by PCP on Tuesday, on c/o white lights flashing, tension headaches, generalized anxiety, EMA c. 5am, waking up paralyzed with fear and feeling urge to flee.  NM's of being confined (e.g., in a bus, being lambasted by a congregant -- actual memory of the time, getting blasted by a rumor-driven mass of resentment and misdirected blame).  Dislikes being told, but does believe in the service and is genuinely seeking help, just wishes these reoccurrences would be permanently over with.  C/o forgetfulness as well, more generally -- little inattentive things like keys, items needed.  The real anxiety trigger is a Civil engineer, contracting (female, Karen Spears), calling the district office to start the process of getting PT removed.  PT's account that she never liked PT, objected off the bat to form of her name used, had conducted a number of actions with church committees, objecting to a range of decisions.  Sees a pattern of racism on complainant's part about her son mixing with Hispanic youth, and sees her engaging in deeply passive-aggressive actions, like taking her son out of worship when PT preaches.  District rep Karen Spears) already sees the complaint without merit, will not take to the D.S.  But complainant goes to a church whose pastor is socially connected to the ringleader from before.  Does have a Market researcher of power brokers and traditionalists who oppose some of her work, but admittedly not nearly as virulently as the group at [employer].  Had pleasant visit to former church Karen Spears, Karen Spears) where she was run off, guest preaching twice (funeral and another occasion).  Clearly reassured by friendly treatment by old friends from the church who had at one time been drawn into  the persecution drama.  Also reassured by friendly approach of the local pastor who followed her, with whom there is some remembered history of bullying (in some measure distorted by anxiety and anger at the time).  PT attributes his positive approach to the influence of a girlfriend, a woman PT new as a very stable character and positive person in that church while she served there.  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  [name], intended for retirement, is almost dried in, will be ready in February to start moving in.    Re. chronic family stresses, has learned son Karen Spears has severe hemochromatosis, dx'd in the spring after noticing iron level and hearing c/o body aches.  Followed by phlebotomoy/cancer center, relocated to [employer] for work after being terminated.  Wife Karen Spears remains in the house here, caring for Karen Spears's kids, including Karen Spears, who has been balking at going to his mother's house Karen Spears).  Once he turns 13, he can speak for his own wish to relocate with his father and be granted by the court to move out of state.  Karen Spears got a phone, has been using it to stay in touch with PT and H daily.  Karen Spears took phone temporarily as discipline, but rescinded on good advice of his dad.  Karen Spears is much calmer and more reasonable since treating hemochromatosis, retiring worries about serious mental illness on his part.  Son Karen Spears calmer.  Making a lot of money, hopefully neither illegal nor manic.  No longer moving to [place].  Seems to be benefitting from constancy of his 6-year 2nd wife, Karen Spears, and counseling with a former colleague, and 10 years working out bankruptcy and  dissolution of first marriage.  Otherwise, sociological/existential anxiety about the state of the country, government, and divisiveness both among the populace and among  members of the [facility], the latter being affected by talk of a split over status of homosexuality in the church, and her own congregations plans affected by fears of crisis in the institutional church.  Meds: currently Xanax 0.25 1/2 QHS, not currently on clonidine or an antidepressant.  Alpha blocker helped but made her groggy, and she is not sure that her physician would be attentive or able to titrate to control side effects.  Interventions:CBT, Solution Focused and Other: psychoeducation Psychoeducation about PTSD and amygdala function is a pattern detector for threats, both instinctive and learned.  Assessed and interpreted multiple triggers for posttraumatic reactions, all associated with previous experience but unrelated or only loosely related in fact.  Coached in cognitive reappraisal of triggers, actual versus perceived versus remembered threats, and reappraisal of her coping abilities and protective factors in her environment and available people.  Affirmed positive developments with kids, occluding illuminating medical findings and signs of maturation.  Discussed medication concerns, endorsing alpha-blocker as more helpful in the long run than benzodiazepine, but well within the realm of personal preference.  Challenged to consider further communication with her primary care doctor about collaborating on dosage rather than assuming the worse.  Obtained consent to communicate directly with primary care via the Epic messaging system.  Discussed diagnoses and personal preferences about listing psychiatric problems on the common chart problem list; PT declines, would prefer mental health diagnoses remain private to this clinic but okay to share directly with her primary care provider.  Mental Status Exam:   Appearance:   Neat and Well Groomed     Behavior:  Appropriate, Sharing and mildly restless  Motor:  Normal  Speech/Language:   Clear and Coherent  Affect:  Appropriate and Congruent  Mood:   somewhat apprehensive  Thought process:  Relevant, Intact and Prone to skip subjects prematurely  Thought content:    Logical  Perceptual disturbances:    Normal  Orientation:  Full (Time, Place, and Person)  Attention:  Good  Concentration:   Fair  Memory:  N/A  Fund of knowledge:   Good  Insight:    Good  Judgment:   Good  Impulse Control:  fair    Reported Symptoms:  Panic attacks, nightmares, anxiety based neurological symptoms, chronic pain, EMA, generalized anxiety and apprehension, tension headaches, muscle tension  Risk Assessment: Danger to Self:  No Self-injurious Behavior: No Danger to Others: No Duty to Warn:no Physical Aggression / Violence:No  Access to Firearms a concern: No  Gang Involvement:No   Diagnosis:   ICD-10-CM   1. Generalized anxiety disorder F41.1   2. Recurrent major depressive disorder, in remission ([facility]) F33.40   3. PTSD (post-traumatic stress disorder) F43.10     Plan:  . Self-remind about her threat-detecting, pattern-detecting brain tracking a wide variety of stimuli and sending up a false alarm when enough of them are present, even if they are unrelated. Marland Kitchen Option to retry clonidine through Dr. Regis Bill . Will message Dr. Regis Bill on patient's consent . Continue to utilize previously learned skills ad lib . Maintain medication, if prescribed,  and work faithfully with relevant prescriber(s) . Call the clinic on-call service, present to ER, or call 911 if any life-threatening emergency . Follow up with me in about a month   Blanchie Serve, PhD

## 2018-03-09 ENCOUNTER — Ambulatory Visit: Payer: Self-pay | Admitting: Psychiatry

## 2018-03-11 LAB — HEPATITIS C ANTIBODY
HEP C AB: NONREACTIVE
SIGNAL TO CUT-OFF: 0.02 (ref <1.00)

## 2018-03-11 LAB — HEMOCHROMATOSIS DNA-PCR(C282Y,H63D)

## 2018-03-11 NOTE — Telephone Encounter (Signed)
I am unable to see that notes from Dr Rica Mote to know [place]. Will send to Dr Regis Bill to advise.

## 2018-03-11 NOTE — Telephone Encounter (Signed)
Please advise Dr Panosh, thanks.   

## 2018-03-12 NOTE — Telephone Encounter (Signed)
This was clonidine     Ok to begin 0.1mg  bid clonidine  Disp 60  Refill x 2( pleas send in rx  )  This is a low dose and can be adjusted    If not helping     Then let us know   How doing  With OV or  Messaging in 1-2 months

## 2018-03-15 MED ORDER — CLONIDINE HCL 0.1 MG PO TABS: 0.1000 mg | ORAL_TABLET | Freq: Two times a day (BID) | ORAL | 2 refills | Status: DC

## 2018-03-22 ENCOUNTER — Other Ambulatory Visit: Payer: Self-pay | Admitting: Internal Medicine

## 2018-03-22 ENCOUNTER — Other Ambulatory Visit: Payer: Self-pay | Admitting: Family Medicine

## 2018-03-26 ENCOUNTER — Other Ambulatory Visit: Payer: Self-pay | Admitting: Internal Medicine

## 2018-03-26 ENCOUNTER — Telehealth: Payer: Self-pay | Admitting: Internal Medicine

## 2018-03-26 MED ORDER — METOPROLOL SUCCINATE ER 50 MG PO TB24: ORAL_TABLET | ORAL | 0 refills | Status: DC

## 2018-03-26 MED ORDER — ALPRAZOLAM 0.25 MG PO TABS: ORAL_TABLET | ORAL | 0 refills | Status: DC

## 2018-03-26 MED ORDER — IBUPROFEN 800 MG PO TABS: 800.0000 mg | ORAL_TABLET | Freq: Two times a day (BID) | ORAL | 3 refills | Status: DC | PRN

## 2018-03-26 NOTE — Telephone Encounter (Signed)
Got message today and will send in refill

## 2018-03-26 NOTE — Telephone Encounter (Signed)
Copied from [place] [id number]. Topic: Quick Communication - See Telephone Encounter >> Mar 26, 2018  1:30 PM Conception Chancy, NT wrote: CRM for notification. See Telephone encounter for: 03/26/18.  Patient is calling and requesting a refill on the following medications. She is very upset her medication was not called in when she had a visit on 03/02/18. Pharmacy sent a request on 03/22/18 and she is upset about that as well. Please advise.  ALPRAZolam (XANAX) 0.25 MG tablet ibuprofen (ADVIL,MOTRIN) 800 MG tablet metoprolol succinate (TOPROL-XL) 50 MG 24 hr tablet

## 2018-03-26 NOTE — Telephone Encounter (Signed)
Left detailed message making aware Nothing further needed.

## 2018-03-26 NOTE — Telephone Encounter (Signed)
Last filled 02/02/18 #45 x 0rf Upcoming visit 03/30/18  Please advise Dr Regis Bill, thanks.

## 2018-03-30 ENCOUNTER — Ambulatory Visit: Payer: 59 | Admitting: Internal Medicine

## 2018-04-23 ENCOUNTER — Other Ambulatory Visit: Payer: Self-pay | Admitting: Internal Medicine

## 2018-04-25 ENCOUNTER — Other Ambulatory Visit: Payer: Self-pay | Admitting: Internal Medicine

## 2018-05-15 ENCOUNTER — Other Ambulatory Visit: Payer: Self-pay | Admitting: Internal Medicine

## 2018-05-18 ENCOUNTER — Ambulatory Visit (INDEPENDENT_AMBULATORY_CARE_PROVIDER_SITE_OTHER): Payer: 59 | Admitting: Family Medicine

## 2018-05-18 ENCOUNTER — Encounter: Payer: Self-pay | Admitting: Family Medicine

## 2018-05-18 VITALS — BP 138/72 | HR 68 | Temp 97.7°F | Wt 156.1 lb

## 2018-05-18 DIAGNOSIS — F411 Generalized anxiety disorder: Secondary | ICD-10-CM | POA: Diagnosis not present

## 2018-05-18 DIAGNOSIS — J0191 Acute recurrent sinusitis, unspecified: Secondary | ICD-10-CM | POA: Diagnosis not present

## 2018-05-18 DIAGNOSIS — B029 Zoster without complications: Secondary | ICD-10-CM

## 2018-05-18 MED ORDER — TERCONAZOLE 0.8 % VA CREA: 1.0000 | TOPICAL_CREAM | Freq: Every day | VAGINAL | 0 refills | Status: DC

## 2018-05-18 MED ORDER — DOXYCYCLINE HYCLATE 100 MG PO CAPS: 100.0000 mg | ORAL_CAPSULE | Freq: Two times a day (BID) | ORAL | 0 refills | Status: AC

## 2018-05-18 MED ORDER — ALPRAZOLAM 0.25 MG PO TABS: ORAL_TABLET | ORAL | 0 refills | Status: DC

## 2018-05-18 MED ORDER — VALACYCLOVIR HCL 1 G PO TABS: 1000.0000 mg | ORAL_TABLET | Freq: Three times a day (TID) | ORAL | 0 refills | Status: DC

## 2018-05-18 NOTE — Telephone Encounter (Signed)
Last refilled 03/26/18 #45 x 0 Last OV CPE 03/02/18  Please advise Dr Regis Bill, thanks.

## 2018-05-18 NOTE — Telephone Encounter (Signed)
Sent in electronically .  I see that she saw dr fry today

## 2018-05-18 NOTE — Progress Notes (Signed)
   Subjective:    Patient ID: Karen Spears, female    DOB: [birth year], 63 y.o.   MRN: [id number]  HPI Here for several issues. First she has had one week of sinus congestion, PND, and coughing up yellow sputum. No fever. Second she has had a rash on the left neck area for 6 months. It was diagnosed as shingles at first and she was treated for this. This helped a bit but the rash has never gone away. It has spread to the left upper chest and to the left posterior scalp. It causes a sharp burning pain in the skin, no itching. Finally she asks for a refill on Xanax. This works well for her anxiety.   Review of Systems  Constitutional: Negative.   HENT: Positive for congestion, postnasal drip and sinus pressure. Negative for sinus pain and sore throat.   Eyes: Negative.   Respiratory: Positive for cough.   Skin: Positive for rash.  Psychiatric/Behavioral: Negative for agitation, confusion and dysphoric mood. The patient is nervous/anxious.        Objective:   Physical Exam Constitutional:      Appearance: Normal appearance.  HENT:     Right Ear: Tympanic membrane and ear canal normal.     Left Ear: Tympanic membrane and ear canal normal.     Nose: Nose normal.     Mouth/Throat:     Pharynx: Oropharynx is clear.  Eyes:     Conjunctiva/sclera: Conjunctivae normal.  Pulmonary:     Effort: Pulmonary effort is normal.     Breath sounds: Normal breath sounds.  Lymphadenopathy:     Cervical: No cervical adenopathy.  Skin:    Comments: There is a band of red papules and vesicles on the left neck area that extends from the left occipital scalp around to the left clavicle  Neurological:     General: No focal deficit present.     Mental Status: She is alert and oriented to person, place, and time.  Psychiatric:        Mood and Affect: Mood normal.        Thought Content: Thought content normal.        Judgment: Judgment normal.           Assessment & Plan:  Sinusitis, treat  with Doxycycline. Add Terconazole for any vaginal yeast infections. Her anxiety is stable. I wrote for a small supply of Xanax to cover her until she can follow up with Dr. Regis Bill. She also has a long standing shingles infection, sop she will take 10 days of Valtrex. Alysia Penna, MD

## 2018-06-21 ENCOUNTER — Other Ambulatory Visit: Payer: Self-pay | Admitting: Internal Medicine

## 2018-06-21 NOTE — Telephone Encounter (Signed)
Last OV: 03/02/18 Last filled:05/18/2018 No upcoming appts scheduled at this time

## 2018-06-25 ENCOUNTER — Telehealth: Payer: Self-pay | Admitting: Family Medicine

## 2018-06-25 MED ORDER — VALACYCLOVIR HCL 1 G PO TABS: 1000.0000 mg | ORAL_TABLET | Freq: Two times a day (BID) | ORAL | 0 refills | Status: DC

## 2018-06-25 NOTE — Telephone Encounter (Signed)
She asks if she can take Valtrex for a longer period of time. We will extend this for 30 days

## 2018-07-31 ENCOUNTER — Other Ambulatory Visit: Payer: Self-pay | Admitting: Internal Medicine

## 2018-08-02 ENCOUNTER — Encounter: Payer: Self-pay | Admitting: Internal Medicine

## 2018-08-02 NOTE — Telephone Encounter (Signed)
Sent in electronically .  

## 2018-09-26 ENCOUNTER — Other Ambulatory Visit: Payer: Self-pay | Admitting: Internal Medicine

## 2018-09-27 NOTE — Telephone Encounter (Signed)
Last filled 08/02/2018 Last OV 05/31/2018

## 2018-10-05 ENCOUNTER — Other Ambulatory Visit: Payer: Self-pay | Admitting: Internal Medicine

## 2018-10-07 MED ORDER — LIDOCAINE 5 % EX PTCH: 1.0000 | MEDICATED_PATCH | CUTANEOUS | 1 refills | Status: DC

## 2018-10-07 NOTE — Telephone Encounter (Signed)
Please send in refill for this  As requested   Tell her that her insurance may no longer cover this  Or  May need a PA    Because . There is a lower strength  Patch otc that is 4%  . different brands

## 2018-10-31 ENCOUNTER — Other Ambulatory Visit: Payer: Self-pay | Admitting: Internal Medicine

## 2018-11-17 ENCOUNTER — Encounter: Payer: Self-pay | Admitting: Internal Medicine

## 2018-11-17 ENCOUNTER — Ambulatory Visit (INDEPENDENT_AMBULATORY_CARE_PROVIDER_SITE_OTHER): Payer: 59 | Admitting: Internal Medicine

## 2018-11-17 ENCOUNTER — Other Ambulatory Visit: Payer: Self-pay

## 2018-11-17 DIAGNOSIS — F411 Generalized anxiety disorder: Secondary | ICD-10-CM

## 2018-11-17 DIAGNOSIS — R21 Rash and other nonspecific skin eruption: Secondary | ICD-10-CM

## 2018-11-17 DIAGNOSIS — Z9109 Other allergy status, other than to drugs and biological substances: Secondary | ICD-10-CM

## 2018-11-17 DIAGNOSIS — J9801 Acute bronchospasm: Secondary | ICD-10-CM | POA: Diagnosis not present

## 2018-11-17 DIAGNOSIS — Z79899 Other long term (current) drug therapy: Secondary | ICD-10-CM

## 2018-11-17 DIAGNOSIS — G4700 Insomnia, unspecified: Secondary | ICD-10-CM

## 2018-11-17 MED ORDER — BUDESONIDE-FORMOTEROL FUMARATE 160-4.5 MCG/ACT IN AERO: 2.0000 | INHALATION_SPRAY | Freq: Two times a day (BID) | RESPIRATORY_TRACT | 5 refills | Status: DC

## 2018-11-17 MED ORDER — VALACYCLOVIR HCL 1 G PO TABS: 1000.0000 mg | ORAL_TABLET | Freq: Two times a day (BID) | ORAL | 1 refills | Status: DC

## 2018-11-17 MED ORDER — ALPRAZOLAM 0.25 MG PO TABS: ORAL_TABLET | ORAL | 0 refills | Status: DC

## 2018-11-17 MED ORDER — ALBUTEROL SULFATE HFA 108 (90 BASE) MCG/ACT IN AERS: 1.0000 | INHALATION_SPRAY | Freq: Four times a day (QID) | RESPIRATORY_TRACT | 3 refills | Status: DC | PRN

## 2018-11-17 MED ORDER — TRIAMCINOLONE ACETONIDE 0.1 % EX CREA: 1.0000 "application " | TOPICAL_CREAM | Freq: Two times a day (BID) | CUTANEOUS | 0 refills | Status: DC

## 2018-11-17 NOTE — Progress Notes (Signed)
Virtual Visit via Video Note  I connected with@ on 11/17/18 at  3:30 PM EDT by a video enabled telemedicine application and verified that I am speaking with the correct person using two identifiers. Location patient: home Location provider:work  office Persons participating in the virtual visit: patient, provider  WIth national recommendations  regarding COVID 19 pandemic   video visit is advised over in office visit for this patient.  Patient aware  of the limitations of evaluation and management by telemedicine and  availability of in person appointments. and agreed to proceed.   HPI: Karen Spears presents for video visit  resp sx are flared up again  Since getting a promotion and moving into new house in [place]  With  Mold and MIldew  Smell began coughing and wheezing right away.  Mitigation  I splanning to be done.  But needs her inhalers as she has not used them for year  Since last environment was better  .  Called yesterday for appt  And didn't get one toll today.  Has a neb machine  At home.    ALso wasn't refill of what dr Clenton Pare her for recurring  Prickly skin  rahs right occiput to neck  Called " shingles l"like   Or other .  ashs for valtrex and tmc  This helped her very mucn  Advice to see dermatology but  [facility] shut down delaying evaluation.  No fever.    Refill alprazolam taking 1/2 at night and  Sunday  ams and prn .     ROS: See pertinent positives and negatives per HPI. No fever    Past Medical History:  Diagnosis Date  . Allergic rhinitis   . Allergy   . Anxiety   . Arthritis    hips  . Asthmatic bronchitis 07/30/2012  . Bladder polyps    and stones dr Amalia Hailey   . Bronchospasm 08/20/2012   Much improved on steroid inhaler and removal from old the house. At this time hold on specialty referral consider PFTs in the future maintain on Symbicort for n   . Chronic hip pain    bursitis and arthritis  . Drug rash 05/13/2011   ? Fixed rash  From diflucan   ?   Marland Kitchen Exposure to mold 07/30/2012  . Female pelvic pain    after hysterectomy ileoinguinal nerve 2/08 saw Dr Brien Few [facility] in the past  . GERD (gastroesophageal reflux disease)    prilosec prn only  . Headache(784.0)   . Hypertension   . MIGRAINE HEADACHE 03/29/2009   Qualifier: Diagnosis of  By: Regis Bill MD, Standley Brooking   . Migraines   . Nerve damage    pelvic floor  . Neuromuscular disorder (HCC)    hips, pelvic floor   . Reaction to severe stress 12/31/2011    Past Surgical History:  Procedure Laterality Date  . ABDOMINAL HYSTERECTOMY    . CESAREAN SECTION  1985  . vocal cord surgery  1987  . WISDOM TOOTH EXTRACTION      Family History  Problem Relation Age of Onset  . Gallstones Mother   . Colon polyps Mother   . Gallstones Sister   . Bipolar disorder Other        child  . Colon polyps Father   . Colon cancer Neg Hx   . Esophageal cancer Neg Hx   . Rectal cancer Neg Hx   . Stomach cancer Neg Hx     Social History  Tobacco Use  . Smoking status: Never Smoker  . Smokeless tobacco: Never Used  Substance Use Topics  . Alcohol use: No    Alcohol/week: 0.0 standard drinks  . Drug use: No      Current Outpatient Medications:  .  albuterol (VENTOLIN HFA) 108 (90 Base) MCG/ACT inhaler, Inhale 1-2 puffs into the lungs every 6 (six) hours as needed for wheezing or shortness of breath., Disp: 18 g, Rfl: 3 .  ALPRAZolam (XANAX) 0.25 MG tablet, TAKE 1 AND 1/2 TABLETS BY MOUTH EVERY NIGHT AT BEDTIME AS NEEDED, Disp: 45 tablet, Rfl: 0 .  ALPRAZolam (XANAX) 0.25 MG tablet, TAKE 1 AND 1/2 TABLETS BY MOUTH EVERY NIGHT AT BEDTIME AS NEEDED, Disp: 45 tablet, Rfl: 0 .  ALPRAZolam (XANAX) 0.25 MG tablet, TAKE 1 AND 1/2 TABLETS BY MOUTH EVERY NIGHT AT BEDTIME AS NEEDED, Disp: 45 tablet, Rfl: 0 .  budesonide-formoterol (SYMBICORT) 160-4.5 MCG/ACT inhaler, Inhale 2 puffs into the lungs 2 (two) times daily., Disp: 1 Inhaler, Rfl: 5 .  doxycycline (VIBRA-TABS) 100 MG tablet, Take 1 tablet  (100 mg total) by mouth 2 (two) times daily. If needed for sinusitis, Disp: 14 tablet, Rfl: 0 .  estradiol (VIVELLE-DOT) 0.1 MG/24HR patch, UNWRAP AND APPLY 1 PATCH ONTO THE SKIN TWICE A WEEK, Disp: 24 patch, Rfl: 1 .  ibuprofen (ADVIL) 800 MG tablet, TAKE 1 TABLET(800 MG) BY MOUTH TWICE DAILY AS NEEDED FOR PAIN, Disp: 60 tablet, Rfl: 3 .  ketoprofen (ORUDIS) 50 MG capsule, Take 1 capsule (50 mg total) by mouth 4 (four) times daily as needed., Disp: 30 capsule, Rfl: 0 .  lidocaine (LIDODERM) 5 %, Place 1 patch onto the skin as directed. Remove & Discard patch within 12 hours or as directed by MD, Disp: 30 patch, Rfl: 1 .  metoprolol succinate (TOPROL-XL) 50 MG 24 hr tablet, TAKE 1/2 TABLET BY MOUTH TWICE DAILY AS DIRECTED, Disp: 30 tablet, Rfl: 5 .  promethazine (PHENERGAN) 25 MG suppository, Place 1 suppository (25 mg total) rectally every 6 (six) hours as needed for up to 7 days for nausea., Disp: 6 each, Rfl: 0 .  rizatriptan (MAXALT) 10 MG tablet, Take 1 tablet (10 mg total) by mouth as needed. May repeat in 2 hours if needed, Disp: 10 tablet, Rfl: 0 .  terconazole (TERAZOL 3) 0.8 % vaginal cream, Place 1 applicator vaginally at bedtime., Disp: 20 g, Rfl: 0 .  triamcinolone cream (KENALOG) 0.1 %, Apply 1 application topically 2 (two) times daily., Disp: 30 g, Rfl: 0 .  valACYclovir (VALTREX) 1000 MG tablet, Take 1 tablet (1,000 mg total) by mouth 2 (two) times daily., Disp: 60 tablet, Rfl: 1  EXAM: BP Readings from Last 3 Encounters:  05/18/18 138/72  03/02/18 124/70  06/30/17 132/88    VITALS per patient if applicable:  GENERAL: alert, oriented, appears well and in no acute distress ocass cough  Good color   HEENT: atraumatic, conjunttiva clear, no obvious abnormalities on inspection of external nose and ears NECK: normal movements of the head and neck LUNGS: on inspection no signs of respiratory distress, breathing rate appears normal, no obvious gross SOB, gasping or wheezing CV: no  obvious cyanosis MS: moves all visible extremities without noticeable abnormality PSYCH/NEURO: pleasant and cooperative, no obvious depression or anxiety, speech and thought processing grossly intact for her  Lab Results  Component Value Date   WBC 4.2 02/16/2018   HGB 13.9 02/16/2018   HCT 40.5 02/16/2018   PLT 220.0 02/16/2018   GLUCOSE  93 02/16/2018   CHOL 148 02/16/2018   TRIG 48.0 02/16/2018   HDL 40.80 02/16/2018   LDLCALC 98 02/16/2018   ALT 11 02/16/2018   AST 15 02/16/2018   NA 138 02/16/2018   K 4.6 02/16/2018   CL 104 02/16/2018   CREATININE 0.77 02/16/2018   BUN 19 02/16/2018   CO2 27 02/16/2018   TSH 2.14 02/16/2018   HGBA1C 5.2 02/16/2018    ASSESSMENT AND PLAN:  Discussed the following assessment and plan:    ICD-10-CM   1. Bronchospasm  J98.01   2. Medication management  Z79.899   3. Environmental allergies  ?  Z91.09    hx of same reaction in past  4. Rash  R21    recurreing unclear cause will refill med but nto sure   would work as per patient report but benign profile to try until see derm  5. Insomnia, unspecified type  G47.00   6. Anxiety state  F41.1    Add  symbicort and prn albuterol refill meds for skin ?   Reviewed alprazolam use with caution and at this time Benefit more than risk of medications  to continue. Plan fu how doing end of week  But  Should respond to meds  Counseled.   Expectant management and discussion of plan and treatment with opportunity to ask questions and all were answered. The patient agreed with the plan and demonstrated an understanding of the instructions.   Advised to call back or seek an in-person evaluation if worsening  or having  further concerns . In interim   Shanon Ace, MD

## 2018-11-26 ENCOUNTER — Other Ambulatory Visit: Payer: Self-pay | Admitting: Internal Medicine

## 2018-11-30 ENCOUNTER — Other Ambulatory Visit: Payer: Self-pay | Admitting: Internal Medicine

## 2018-12-08 LAB — HM MAMMOGRAPHY

## 2018-12-09 ENCOUNTER — Encounter: Payer: Self-pay | Admitting: Internal Medicine

## 2019-01-28 ENCOUNTER — Other Ambulatory Visit: Payer: Self-pay

## 2019-01-31 MED ORDER — ALPRAZOLAM 0.25 MG PO TABS: ORAL_TABLET | ORAL | 0 refills | Status: DC

## 2019-01-31 NOTE — Telephone Encounter (Signed)
Message routed to PCP CMA  

## 2019-01-31 NOTE — Telephone Encounter (Signed)
Last ov:12/07/2018 Last filled:11/17/2018

## 2019-02-02 NOTE — Telephone Encounter (Signed)
Medication has been filed

## 2019-02-22 ENCOUNTER — Other Ambulatory Visit: Payer: Self-pay | Admitting: Internal Medicine

## 2019-02-22 NOTE — Telephone Encounter (Signed)
Last ov:11/17/2018 Last filled:01/31/2019

## 2019-03-29 ENCOUNTER — Other Ambulatory Visit: Payer: Self-pay | Admitting: Internal Medicine

## 2019-04-11 ENCOUNTER — Other Ambulatory Visit: Payer: Self-pay

## 2019-04-11 ENCOUNTER — Encounter: Payer: Self-pay | Admitting: Family Medicine

## 2019-04-11 ENCOUNTER — Telehealth (INDEPENDENT_AMBULATORY_CARE_PROVIDER_SITE_OTHER): Payer: 59 | Admitting: Family Medicine

## 2019-04-11 DIAGNOSIS — J019 Acute sinusitis, unspecified: Secondary | ICD-10-CM | POA: Diagnosis not present

## 2019-04-11 MED ORDER — DOXYCYCLINE HYCLATE 100 MG PO CAPS: 100.0000 mg | ORAL_CAPSULE | Freq: Two times a day (BID) | ORAL | 0 refills | Status: AC

## 2019-04-11 NOTE — Progress Notes (Signed)
Virtual Visit via Video Note  I connected with the patient on 04/11/19 at  3:30 PM EST by a video enabled telemedicine application and verified that I am speaking with the correct person using two identifiers.  Location patient: home Location provider:work or home office Persons participating in the virtual visit: patient, provider  I discussed the limitations of evaluation and management by telemedicine and the availability of in person appointments. The patient expressed understanding and agreed to proceed.   HPI: Here for 10 days of a stuffy head, PND, a ST on the right side, and a mild headache. No fever or body aches. No cough or SOB. No NVD. She has been drinking fluids and using warm salt water rinses. Taking Ibuprofen as needed.    ROS: See pertinent positives and negatives per HPI.  Past Medical History:  Diagnosis Date  . Allergic rhinitis   . Allergy   . Anxiety   . Arthritis    hips  . Asthmatic bronchitis 07/30/2012  . Bladder polyps    and stones dr Amalia Hailey   . Bronchospasm 08/20/2012   Much improved on steroid inhaler and removal from old the house. At this time hold on specialty referral consider PFTs in the future maintain on Symbicort for n   . Chronic hip pain    bursitis and arthritis  . Drug rash 05/13/2011   ? Fixed rash  From diflucan  ?   Marland Kitchen Exposure to mold 07/30/2012  . 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 GERD (gastroesophageal reflux disease)    prilosec prn only  . Headache(784.0)   . Hypertension   . MIGRAINE HEADACHE 03/29/2009   Qualifier: Diagnosis of  By: Regis Bill MD, Standley Brooking   . Migraines   . Nerve damage    pelvic floor  . Neuromuscular disorder (HCC)    hips, pelvic floor   . Reaction to severe stress 12/31/2011    Past Surgical History:  Procedure Laterality Date  . ABDOMINAL HYSTERECTOMY    . CESAREAN SECTION  1985  . vocal cord surgery  1987  . WISDOM TOOTH EXTRACTION       Family History  Problem Relation Age of Onset  . Gallstones Mother   . Colon polyps Mother   . Gallstones Sister   . Bipolar disorder Other        child  . Colon polyps Father   . Colon cancer Neg Hx   . Esophageal cancer Neg Hx   . Rectal cancer Neg Hx   . Stomach cancer Neg Hx      Current Outpatient Medications:  .  albuterol (VENTOLIN HFA) 108 (90 Base) MCG/ACT inhaler, Inhale 1-2 puffs into the lungs every 6 (six) hours as needed for wheezing or shortness of breath., Disp: 18 g, Rfl: 3 .  ALPRAZolam (XANAX) 0.25 MG tablet, TAKE 1 AND 1/2 TABLETS BY MOUTH EVERY NIGHT AT BEDTIME AS NEEDED, Disp: 45 tablet, Rfl: 0 .  ALPRAZolam (XANAX) 0.25 MG tablet, TAKE 1 AND 1/2 TABLETS BY MOUTH EVERY NIGHT AT BEDTIME AS NEEDED, Disp: 45 tablet, Rfl: 0 .  ALPRAZolam (XANAX) 0.25 MG tablet, TAKE 1 AND 1/2 TABLETS BY MOUTH EVERY NIGHT AT BEDTIME AS NEEDED, Disp: 45 tablet, Rfl: 0 .  ALPRAZolam (XANAX) 0.25 MG tablet, TAKE 1 AND 1/2 TABLETS BY MOUTH EVERY NIGHT AT BEDTIME AS NEEDED, Disp: 45 tablet, Rfl: 0 .  budesonide-formoterol (SYMBICORT) 160-4.5 MCG/ACT inhaler, Inhale  2 puffs into the lungs 2 (two) times daily., Disp: 1 Inhaler, Rfl: 5 .  doxycycline (VIBRAMYCIN) 100 MG capsule, Take 1 capsule (100 mg total) by mouth 2 (two) times daily for 10 days., Disp: 20 capsule, Rfl: 0 .  estradiol (VIVELLE-DOT) 0.1 MG/24HR patch, UNWRAP AND APPLY 1 PATCH TO THE SKIN TWICE A WEEK, Disp: 24 patch, Rfl: 1 .  ibuprofen (ADVIL) 800 MG tablet, TAKE 1 TABLET BY MOUTH TWICE DAILY AS NEEDED FOR PAIN, Disp: 60 tablet, Rfl: 3 .  ketoprofen (ORUDIS) 50 MG capsule, Take 1 capsule (50 mg total) by mouth 4 (four) times daily as needed., Disp: 30 capsule, Rfl: 0 .  lidocaine (LIDODERM) 5 %, Place 1 patch onto the skin as directed. Remove & Discard patch within 12 hours or as directed by MD, Disp: 30 patch, Rfl: 1 .  metoprolol succinate (TOPROL-XL) 50 MG 24 hr tablet, TAKE 1/2 TABLET BY MOUTH TWICE DAILY AS DIRECTED,  Disp: 30 tablet, Rfl: 5 .  promethazine (PHENERGAN) 25 MG suppository, Place 1 suppository (25 mg total) rectally every 6 (six) hours as needed for up to 7 days for nausea., Disp: 6 each, Rfl: 0 .  rizatriptan (MAXALT) 10 MG tablet, Take 1 tablet (10 mg total) by mouth as needed. May repeat in 2 hours if needed, Disp: 10 tablet, Rfl: 0 .  terconazole (TERAZOL 3) 0.8 % vaginal cream, Place 1 applicator vaginally at bedtime., Disp: 20 g, Rfl: 0 .  triamcinolone cream (KENALOG) 0.1 %, Apply 1 application topically 2 (two) times daily., Disp: 30 g, Rfl: 0 .  valACYclovir (VALTREX) 1000 MG tablet, Take 1 tablet (1,000 mg total) by mouth 2 (two) times daily., Disp: 60 tablet, Rfl: 1  EXAM:  VITALS per patient if applicable:  GENERAL: alert, oriented, appears well and in no acute distress  HEENT: atraumatic, conjunttiva clear, no obvious abnormalities on inspection of external nose and ears  NECK: normal movements of the head and neck  LUNGS: on inspection no signs of respiratory distress, breathing rate appears normal, no obvious gross SOB, gasping or wheezing  CV: no obvious cyanosis  MS: moves all visible extremities without noticeable abnormality  PSYCH/NEURO: pleasant and cooperative, no obvious depression or anxiety, speech and thought processing grossly intact  ASSESSMENT AND PLAN: This is most likely a sinusitis, so we will treat it with Doxycycline. I also advised her to be tested for the Covid-19 virus and she agreed.  Alysia Penna, MD  Discussed the following assessment and plan:  No diagnosis found.     I discussed the assessment and treatment plan with the patient. The patient was provided an opportunity to ask questions and all were answered. The patient agreed with the plan and demonstrated an understanding of the instructions.   The patient was advised to call back or seek an in-person evaluation if the symptoms worsen or if the condition fails to improve as  anticipated.

## 2019-05-02 ENCOUNTER — Other Ambulatory Visit: Payer: Self-pay | Admitting: Internal Medicine

## 2019-05-24 ENCOUNTER — Other Ambulatory Visit: Payer: Self-pay

## 2019-05-24 ENCOUNTER — Telehealth (INDEPENDENT_AMBULATORY_CARE_PROVIDER_SITE_OTHER): Payer: 59 | Admitting: Internal Medicine

## 2019-05-24 ENCOUNTER — Telehealth: Payer: Self-pay | Admitting: *Deleted

## 2019-05-24 ENCOUNTER — Encounter: Payer: Self-pay | Admitting: Internal Medicine

## 2019-05-24 DIAGNOSIS — R030 Elevated blood-pressure reading, without diagnosis of hypertension: Secondary | ICD-10-CM | POA: Diagnosis not present

## 2019-05-24 DIAGNOSIS — R42 Dizziness and giddiness: Secondary | ICD-10-CM | POA: Diagnosis not present

## 2019-05-24 DIAGNOSIS — Z79899 Other long term (current) drug therapy: Secondary | ICD-10-CM | POA: Diagnosis not present

## 2019-05-24 DIAGNOSIS — G43909 Migraine, unspecified, not intractable, without status migrainosus: Secondary | ICD-10-CM

## 2019-05-24 MED ORDER — LOSARTAN POTASSIUM 25 MG PO TABS: 25.0000 mg | ORAL_TABLET | Freq: Every day | ORAL | 1 refills | Status: DC

## 2019-05-24 MED ORDER — METOPROLOL SUCCINATE ER 50 MG PO TB24: ORAL_TABLET | ORAL | 5 refills | Status: DC

## 2019-05-24 NOTE — Progress Notes (Signed)
Virtual Visit via Video Note  I connected with@ on 05/24/19 at  3:00 PM EST by a video enabled telemedicine application and verified that I am speaking with the correct person using two identifiers. Location patient: home Location provider:work office Persons participating in the virtual visit: patient, provider  [place] recommendations  regarding COVID 19 pandemic   video visit is advised over in office visit for this patient.  Patient aware  of the limitations of evaluation and management by telemedicine and  availability of in person appointments. and agreed to proceed.   HPI: Karen Spears presents for video visit SDA   Regarding concern about elevated blood pressure readings increasing frequency of headaches and dizziness.  She had noted recently her blood pressure was 181/90 then 165/105 this morning 149/87 she is having more frequent headaches and her migraines have come back is taking over-the-counter aspirin she is out of her triptan. Of note her estrogen patches were not sticking very much so she basically went off and has been off for 2 weeks and think she feels okay with this. She complains of a dizzy feeling that might be spinning he is not syncopal with no cardiovascular symptoms except a transient occasional palpitation her pulse is reading 54-69 range.  She is taking metoprolol half of a 50 XL twice daily. She is cut back on caffeine no alcohol.  Last cpx 10 219  rx sinusitis 11 20 doxy  covid neg  ROS: See pertinent positives and negatives per HPI. No syncope   Past Medical History:  Diagnosis Date  . Allergic rhinitis   . Allergy   . Anxiety   . Arthritis    hips  . Asthmatic bronchitis 07/30/2012  . Bladder polyps    and stones dr Amalia Hailey   . Bronchospasm 08/20/2012   Much improved on steroid inhaler and removal from old the house. At this time hold on specialty referral consider PFTs in the future maintain on Symbicort for n   . Chronic hip pain    bursitis and arthritis  . Drug rash 05/13/2011   ? Fixed rash  From diflucan  ?   Marland Kitchen Exposure to mold 07/30/2012  . 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 GERD (gastroesophageal reflux disease)    prilosec prn only  . Headache(784.0)   . Hypertension   . MIGRAINE HEADACHE 03/29/2009   Qualifier: Diagnosis of  By: Regis Bill MD, Standley Brooking   . Migraines   . Nerve damage    pelvic floor  . Neuromuscular disorder (HCC)    hips, pelvic floor   . Reaction to severe stress 12/31/2011    Past Surgical History:  Procedure Laterality Date  . ABDOMINAL HYSTERECTOMY    . CESAREAN SECTION  1985  . vocal cord surgery  1987  . WISDOM TOOTH EXTRACTION      Family History  Problem Relation Age of Onset  . Gallstones Mother   . Colon polyps Mother   . Gallstones Sister   . Bipolar disorder Other        child  . Colon polyps Father   . Colon cancer Neg Hx   . Esophageal cancer Neg Hx   . Rectal cancer Neg Hx   . Stomach cancer Neg Hx     Social History   Tobacco Use  . Smoking status: Never Smoker  . Smokeless tobacco: Never Used  Substance Use Topics  .  Alcohol use: No    Alcohol/week: 0.0 standard drinks  . Drug use: No      Current Outpatient Medications:  .  albuterol (VENTOLIN HFA) 108 (90 Base) MCG/ACT inhaler, Inhale 1-2 puffs into the lungs every 6 (six) hours as needed for wheezing or shortness of breath., Disp: 18 g, Rfl: 3 .  ALPRAZolam (XANAX) 0.25 MG tablet, TAKE 1 AND 1/2 TABLETS BY MOUTH EVERY NIGHT AT BEDTIME AS NEEDED, Disp: 45 tablet, Rfl: 0 .  ALPRAZolam (XANAX) 0.25 MG tablet, TAKE 1 AND 1/2 TABLETS BY MOUTH EVERY NIGHT AT BEDTIME AS NEEDED, Disp: 45 tablet, Rfl: 0 .  ALPRAZolam (XANAX) 0.25 MG tablet, TAKE 1 AND 1/2 TABLETS BY MOUTH EVERY NIGHT AT BEDTIME AS NEEDED, Disp: 45 tablet, Rfl: 0 .  ALPRAZolam (XANAX) 0.25 MG tablet, TAKE 1 AND 1/2 TABLETS BY MOUTH EVERY NIGHT AT BEDTIME AS NEEDED, Disp: 45  tablet, Rfl: 0 .  budesonide-formoterol (SYMBICORT) 160-4.5 MCG/ACT inhaler, Inhale 2 puffs into the lungs 2 (two) times daily., Disp: 1 Inhaler, Rfl: 5 .  ibuprofen (ADVIL) 800 MG tablet, TAKE 1 TABLET BY MOUTH TWICE DAILY AS NEEDED FOR PAIN, Disp: 60 tablet, Rfl: 3 .  ketoprofen (ORUDIS) 50 MG capsule, Take 1 capsule (50 mg total) by mouth 4 (four) times daily as needed., Disp: 30 capsule, Rfl: 0 .  lidocaine (LIDODERM) 5 %, Place 1 patch onto the skin as directed. Remove & Discard patch within 12 hours or as directed by MD, Disp: 30 patch, Rfl: 1 .  losartan (COZAAR) 25 MG tablet, Take 1 tablet (25 mg total) by mouth daily., Disp: 30 tablet, Rfl: 1 .  metoprolol succinate (TOPROL-XL) 50 MG 24 hr tablet, .TAKE 1/2 TABLET BY MOUTH TWICE DAILY AS DIRECTED, Disp: 30 tablet, Rfl: 5 .  promethazine (PHENERGAN) 25 MG suppository, Place 1 suppository (25 mg total) rectally every 6 (six) hours as needed for up to 7 days for nausea., Disp: 6 each, Rfl: 0 .  rizatriptan (MAXALT) 10 MG tablet, Take 1 tablet (10 mg total) by mouth as needed. May repeat in 2 hours if needed, Disp: 10 tablet, Rfl: 0 .  terconazole (TERAZOL 3) 0.8 % vaginal cream, Place 1 applicator vaginally at bedtime., Disp: 20 g, Rfl: 0 .  triamcinolone cream (KENALOG) 0.1 %, Apply 1 application topically 2 (two) times daily., Disp: 30 g, Rfl: 0 .  valACYclovir (VALTREX) 1000 MG tablet, Take 1 tablet (1,000 mg total) by mouth 2 (two) times daily., Disp: 60 tablet, Rfl: 1  EXAM: BP Readings from Last 3 Encounters:  05/18/18 138/72  03/02/18 124/70  06/30/17 132/88    VITALS per patient if applicable: Reported [id number] this morning  GENERAL: alert, oriented, appears well and in no acute distress  HEENT: atraumatic, conjunttiva clear, no obvious abnormalities on inspection of external nose and ears  NECK: normal movements of the head and neck  LUNGS: on inspection no signs of respiratory distress, breathing rate appears normal, no  obvious gross SOB, gasping or wheezing  CV: no obvious cyanosis  MS: moves all visible extremities without noticeable abnormality  PSYCH/NEURO: pleasant and cooperative, no obvious depression or anxiety, speech and thought processing grossly intact Lab Results  Component Value Date   WBC 4.2 02/16/2018   HGB 13.9 02/16/2018   HCT 40.5 02/16/2018   PLT 220.0 02/16/2018   GLUCOSE 93 02/16/2018   CHOL 148 02/16/2018   TRIG 48.0 02/16/2018   HDL 40.80 02/16/2018   LDLCALC 98 02/16/2018   ALT 11  02/16/2018   AST 15 02/16/2018   NA 138 02/16/2018   K 4.6 02/16/2018   CL 104 02/16/2018   CREATININE 0.77 02/16/2018   BUN 19 02/16/2018   CO2 27 02/16/2018   TSH 2.14 02/16/2018   HGBA1C 5.2 02/16/2018    ASSESSMENT AND PLAN:  Discussed the following assessment and plan:    ICD-10-CM   1. Elevated blood pressure reading  R03.0   2. Migraine without status migrainosus, not intractable, unspecified migraine type  G43.909   3. Dizziness  R42   4. Medication management  Z79.899    Elevated blood pressure hypertension baseline more elevated with much higher readings and a history of headaches and "dizziness" uncertain cause effect however her baseline is elevated she prefers no medication however would add losartan 25 mg once a day and consider decreasing her metoprolol if needed.  Told her there are other options we will try to minimize risk of side effects. And arb have been associ with  Positive effects on migraine control She will calendar her headaches take two Aleve as needed have some reservation about refilling her triptan because of the amount of elevation of blood pressure Suppose withdrawal from estrogen could have triggered headaches in which case should subside. No other alarm features. She is overdue for blood work monitoring can do this at a follow-up visit in about 3 weeks February 2 in the morning when she will be in town. Advised we add low-dose medication before we  take one away because of the beta-blocker adjustment. She will monitor her headaches and address at follow-up.  Call us with alarm symptoms or concerns before then if needed.  Counseled.   Expectant management and discussion of plan and treatment with opportunity to ask questions and all were answered. The patient agreed with the plan and demonstrated an understanding of the instructions.   Advised to call back or seek an in-person evaluation if worsening  or having  further concerns . Return for feb 2nd in person lab and fu at that time.   DATA REVIEWED:  Record labs  And plan   Total time on date  of service including record review ordering and plan of care:   And counseling plan  33 minutes      Shanon Ace, MD

## 2019-05-24 NOTE — Telephone Encounter (Signed)
Returned call to patient and scheduled virtual visit for today. Copied from [place] [phone]. Topic: General - Other >> May 24, 2019  8:41 AM Keene Breath wrote: Reason for CRM: Patient would like to schedule an appt. For her BP as soon as possible.  Tried the office, but no answer.  CB# [phone]

## 2019-06-14 ENCOUNTER — Encounter: Payer: Self-pay | Admitting: Internal Medicine

## 2019-06-14 ENCOUNTER — Other Ambulatory Visit: Payer: Self-pay

## 2019-06-14 ENCOUNTER — Ambulatory Visit (INDEPENDENT_AMBULATORY_CARE_PROVIDER_SITE_OTHER): Payer: 59 | Admitting: Internal Medicine

## 2019-06-14 VITALS — BP 128/80 | HR 85 | Temp 97.6°F | Wt 154.8 lb

## 2019-06-14 DIAGNOSIS — R232 Flushing: Secondary | ICD-10-CM

## 2019-06-14 DIAGNOSIS — Z79899 Other long term (current) drug therapy: Secondary | ICD-10-CM | POA: Diagnosis not present

## 2019-06-14 DIAGNOSIS — I10 Essential (primary) hypertension: Secondary | ICD-10-CM

## 2019-06-14 DIAGNOSIS — Z148 Genetic carrier of other disease: Secondary | ICD-10-CM

## 2019-06-14 DIAGNOSIS — Z833 Family history of diabetes mellitus: Secondary | ICD-10-CM

## 2019-06-14 DIAGNOSIS — Z8349 Family history of other endocrine, nutritional and metabolic diseases: Secondary | ICD-10-CM | POA: Diagnosis not present

## 2019-06-14 LAB — HEPATIC FUNCTION PANEL
ALT: 22 U/L (ref 0–35)
AST: 17 U/L (ref 0–37)
Albumin: 4.2 g/dL (ref 3.5–5.2)
Alkaline Phosphatase: 56 U/L (ref 39–117)
Bilirubin, Direct: 0.1 mg/dL (ref 0.0–0.3)
Total Bilirubin: 0.5 mg/dL (ref 0.2–1.2)
Total Protein: 6.4 g/dL (ref 6.0–8.3)

## 2019-06-14 LAB — LIPID PANEL
Cholesterol: 183 mg/dL (ref 0–200)
HDL: 45.1 mg/dL (ref >39.00)
LDL Cholesterol: 127 mg/dL — ABNORMAL HIGH (ref 0–99)
NonHDL: 137.65
Total CHOL/HDL Ratio: 4
Triglycerides: 55 mg/dL (ref 0.0–149.0)
VLDL: 11 mg/dL (ref 0.0–40.0)

## 2019-06-14 LAB — CBC WITH DIFFERENTIAL/PLATELET
Basophils Absolute: 0 10*3/uL (ref 0.0–0.1)
Basophils Relative: 0.6 % (ref 0.0–3.0)
Eosinophils Absolute: 0.1 10*3/uL (ref 0.0–0.7)
Eosinophils Relative: 2.3 % (ref 0.0–5.0)
HCT: 39 % (ref 36.0–46.0)
Hemoglobin: 13.3 g/dL (ref 12.0–15.0)
Lymphocytes Relative: 32.2 % (ref 12.0–46.0)
Lymphs Abs: 1.5 10*3/uL (ref 0.7–4.0)
MCHC: 34 g/dL (ref 30.0–36.0)
MCV: 87.8 fl (ref 78.0–100.0)
Monocytes Absolute: 0.2 10*3/uL (ref 0.1–1.0)
Monocytes Relative: 5.3 % (ref 3.0–12.0)
Neutro Abs: 2.8 10*3/uL (ref 1.4–7.7)
Neutrophils Relative %: 59.6 % (ref 43.0–77.0)
Platelets: 218 10*3/uL (ref 150.0–400.0)
RBC: 4.44 Mil/uL (ref 3.87–5.11)
RDW: 12.5 % (ref 11.5–15.5)
WBC: 4.7 10*3/uL (ref 4.0–10.5)

## 2019-06-14 LAB — BASIC METABOLIC PANEL
BUN: 20 mg/dL (ref 6–23)
CO2: 28 mEq/L (ref 19–32)
Calcium: 9 mg/dL (ref 8.4–10.5)
Chloride: 106 mEq/L (ref 96–112)
Creatinine, Ser: 0.73 mg/dL (ref 0.40–1.20)
GFR: 80.71 mL/min (ref >60.00)
Glucose, Bld: 99 mg/dL (ref 70–99)
Potassium: 4.2 mEq/L (ref 3.5–5.1)
Sodium: 139 mEq/L (ref 135–145)

## 2019-06-14 LAB — HEMOGLOBIN A1C: Hgb A1c MFr Bld: 5.5 % (ref 4.6–6.5)

## 2019-06-14 LAB — IBC + FERRITIN
Ferritin: 149.4 ng/mL (ref 10.0–291.0)
Iron: 147 ug/dL — ABNORMAL HIGH (ref 42–145)
Saturation Ratios: 48.2 % (ref 20.0–50.0)
Transferrin: 218 mg/dL (ref 212.0–360.0)

## 2019-06-14 LAB — TSH: TSH: 2.6 u[IU]/mL (ref 0.35–4.50)

## 2019-06-14 MED ORDER — ESTRADIOL 0.05 MG/24HR TD PTTW: 1.0000 | MEDICATED_PATCH | TRANSDERMAL | 6 refills | Status: DC

## 2019-06-14 MED ORDER — LOSARTAN POTASSIUM 25 MG PO TABS: 25.0000 mg | ORAL_TABLET | Freq: Every day | ORAL | 2 refills | Status: DC

## 2019-06-14 MED ORDER — ALPRAZOLAM 0.25 MG PO TABS: ORAL_TABLET | ORAL | 0 refills | Status: DC

## 2019-06-14 NOTE — Progress Notes (Signed)
Results are normal  but cholesterol slightly up from last  year . Kidnyes liver and blood count thyroid are all normal .  Iron levels are upper limits of normal . Make sure not taking any extra iron.  Advise recheck iron panel in 12 months or as needed

## 2019-06-14 NOTE — Patient Instructions (Signed)
Will refill losartan and alprazolam.   Begin lower dose  Estrogen patch to try.  For hot flashes.   Will notify you  of labs when available.   Then plan   Fu virtual visit in 3-4 months or as needed.

## 2019-06-14 NOTE — Progress Notes (Signed)
This visit occurred during the SARS-CoV-2 public health emergency.  Safety protocols were in place, including screening questions prior to the visit, additional usage of staff PPE, and extensive cleaning of exam room while observing appropriate contact time as indicated for disinfecting solutions.    Chief Complaint  Patient presents with  . Hypertension    Pt bp has gotten better she has brought her readings with her   . Joint Swelling    Pt states she has been having ankle swelling for months  . Hot Flashes    Pt is having hot flashes at night     HPI: Karen Spears 64 y.o. come in for BP elevation and headaches   Since last checking in regard to her headaches dizziness and blood pressure she is much better.  Her headaches are gone her dizziness is better she did have a slight dizziness when she first started the losartan but that is now better she takes it at night her blood pressure ranges are normal in the morning and sometimes will go up to 140 8 in the afternoon.  She is stopped still her estrogen patch as before when she ran out and his been having significant nocturnal hot flashes that keep her awake up to 5 and 6 times a night. Otherwise she feels better.  Wonders if she should go back on a lower dose to control this.  Wanted me to check her left ankle swelling that she has had for years off and on since an injury no pain wonders if it needs to be checked out. She does feel cold at night hard to get warm also.  Below is assessment of last visual visit. 1. Elevated blood pressure reading  R03.0   2. Migraine without status migrainosus, not intractable, unspecified migraine type  G43.909   3. Dizziness  R42   4. Medication management  Z79.899    Elevated blood pressure hypertension baseline more elevated with much higher readings and a history of headaches and "dizziness" uncertain cause effect however her baseline is elevated she prefers no medication however would add  losartan 25 mg once a day and consider decreasing her metoprolol if needed.  Told her there are other options we will try to minimize risk of side effects. And arb have been associ with  Positive effects on migraine control She will calendar her headaches take two Aleve as needed have some reservation about refilling her triptan because of the amount of elevation of blood pressure Suppose withdrawal from estrogen could have triggered headaches in which case should subside. No other alarm features. She is overdue for blood work monitoring can do this at a follow-up visit in about 3 weeks February 2 in the morning when she will be in town. Advised we add low-dose medication before we take one away because of the beta-blocker adjustment. She will monitor her headaches and address at follow-up.  Call us with alarm symptoms or concerns before then if needed.   ROS: See pertinent positives and negatives per HPI.  Past Medical History:  Diagnosis Date  . Allergic rhinitis   . Allergy   . Anxiety   . Arthritis    hips  . Asthmatic bronchitis 07/30/2012  . Bladder polyps    and stones dr Amalia Hailey   . Bronchospasm 08/20/2012   Much improved on steroid inhaler and removal from old the house. At this time hold on specialty referral consider PFTs in the future maintain on Symbicort for n   .  Chronic hip pain    bursitis and arthritis  . Drug rash 05/13/2011   ? Fixed rash  From diflucan  ?   Marland Kitchen Exposure to mold 07/30/2012  . Female pelvic pain    after hysterectomy ileoinguinal nerve 2/08 saw Dr Brien Few [facility] in the past  . GERD (gastroesophageal reflux disease)    prilosec prn only  . Headache(784.0)   . Hypertension   . MIGRAINE HEADACHE 03/29/2009   Qualifier: Diagnosis of  By: Regis Bill MD, Standley Brooking   . Migraines   . Nerve damage    pelvic floor  . Neuromuscular disorder (HCC)    hips, pelvic floor   . Reaction to severe stress 12/31/2011    Family History  Problem Relation Age of Onset  .  Gallstones Mother   . Colon polyps Mother   . Gallstones Sister   . Bipolar disorder Other        child  . Colon polyps Father   . Colon cancer Neg Hx   . Esophageal cancer Neg Hx   . Rectal cancer Neg Hx   . Stomach cancer Neg Hx       Outpatient Medications Prior to Visit  Medication Sig Dispense Refill  . albuterol (VENTOLIN HFA) 108 (90 Base) MCG/ACT inhaler Inhale 1-2 puffs into the lungs every 6 (six) hours as needed for wheezing or shortness of breath. 18 g 3  . ALPRAZolam (XANAX) 0.25 MG tablet TAKE 1 AND 1/2 TABLETS BY MOUTH EVERY NIGHT AT BEDTIME AS NEEDED 45 tablet 0  . ALPRAZolam (XANAX) 0.25 MG tablet TAKE 1 AND 1/2 TABLETS BY MOUTH EVERY NIGHT AT BEDTIME AS NEEDED 45 tablet 0  . ALPRAZolam (XANAX) 0.25 MG tablet TAKE 1 AND 1/2 TABLETS BY MOUTH EVERY NIGHT AT BEDTIME AS NEEDED 45 tablet 0  . budesonide-formoterol (SYMBICORT) 160-4.5 MCG/ACT inhaler Inhale 2 puffs into the lungs 2 (two) times daily. 1 Inhaler 5  . ibuprofen (ADVIL) 800 MG tablet TAKE 1 TABLET BY MOUTH TWICE DAILY AS NEEDED FOR PAIN 60 tablet 3  . ketoprofen (ORUDIS) 50 MG capsule Take 1 capsule (50 mg total) by mouth 4 (four) times daily as needed. 30 capsule 0  . lidocaine (LIDODERM) 5 % Place 1 patch onto the skin as directed. Remove & Discard patch within 12 hours or as directed by MD 30 patch 1  . metoprolol succinate (TOPROL-XL) 50 MG 24 hr tablet .TAKE 1/2 TABLET BY MOUTH TWICE DAILY AS DIRECTED 30 tablet 5  . rizatriptan (MAXALT) 10 MG tablet Take 1 tablet (10 mg total) by mouth as needed. May repeat in 2 hours if needed 10 tablet 0  . terconazole (TERAZOL 3) 0.8 % vaginal cream Place 1 applicator vaginally at bedtime. 20 g 0  . triamcinolone cream (KENALOG) 0.1 % Apply 1 application topically 2 (two) times daily. 30 g 0  . valACYclovir (VALTREX) 1000 MG tablet Take 1 tablet (1,000 mg total) by mouth 2 (two) times daily. 60 tablet 1  . ALPRAZolam (XANAX) 0.25 MG tablet TAKE 1 AND 1/2 TABLETS BY MOUTH  EVERY NIGHT AT BEDTIME AS NEEDED 45 tablet 0  . losartan (COZAAR) 25 MG tablet Take 1 tablet (25 mg total) by mouth daily. 30 tablet 1  . promethazine (PHENERGAN) 25 MG suppository Place 1 suppository (25 mg total) rectally every 6 (six) hours as needed for up to 7 days for nausea. 6 each 0   No facility-administered medications prior to visit.     EXAM:  BP 128/80 (  BP Location: Right Arm, Patient Position: Sitting, Cuff Size: Normal)   Pulse 85   Temp 97.6 F (36.4 C) (Temporal)   Wt 154 lb 12.8 oz (70.2 kg)   SpO2 97%   BMI 27.21 kg/m   Body mass index is 27.21 kg/m. Wt Readings from Last 3 Encounters:  06/14/19 154 lb 12.8 oz (70.2 kg)  05/18/18 156 lb 2 oz (70.8 kg)  03/02/18 152 lb (68.9 kg)   BP Readings from Last 3 Encounters:  06/14/19 128/80  05/18/18 138/72  03/02/18 124/70    GENERAL: vitals reviewed and listed above, alert, oriented, appears well hydrated and in no acute distress HEENT: atraumatic, conjunctiva  clear, no obvious abnormalities on inspection of external nose and ears OP : Masked NECK: no obvious masses on inspection palpation  LUNGS: clear to auscultation bilaterally, no wheezes, rales or rhonchi, good air movement CV: HRRR, no clubbing cyanosis or  peripheral edema nl cap refill  MS: moves all extremities without noticeable focal  Abnormality left anckle woith puffy  Cystic feeling over the lateral malleoli no pain  Nl function nv ok PSYCH: pleasant and cooperative, no obvious depression or anxiety Lab Results  Component Value Date   WBC 4.2 02/16/2018   HGB 13.9 02/16/2018   HCT 40.5 02/16/2018   PLT 220.0 02/16/2018   GLUCOSE 93 02/16/2018   CHOL 148 02/16/2018   TRIG 48.0 02/16/2018   HDL 40.80 02/16/2018   LDLCALC 98 02/16/2018   ALT 11 02/16/2018   AST 15 02/16/2018   NA 138 02/16/2018   K 4.6 02/16/2018   CL 104 02/16/2018   CREATININE 0.77 02/16/2018   BUN 19 02/16/2018   CO2 27 02/16/2018   TSH 2.14 02/16/2018   HGBA1C 5.2  02/16/2018   BP Readings from Last 3 Encounters:  06/14/19 128/80  05/18/18 138/72  03/02/18 124/70    ASSESSMENT AND PLAN:  Discussed the following assessment and plan:  Hot flashes - Plan: Basic metabolic panel, CBC with Differential/Platelet, Hemoglobin A1c, Hepatic function panel, Lipid panel, TSH, IBC + Ferritin  Medication management - Plan: Basic metabolic panel, CBC with Differential/Platelet, Hemoglobin A1c, Hepatic function panel, Lipid panel, TSH, IBC + Ferritin  Essential hypertension - Plan: Basic metabolic panel, CBC with Differential/Platelet, Hemoglobin A1c, Hepatic function panel, Lipid panel, TSH  Family history of diabetes mellitus - Plan: Basic metabolic panel, CBC with Differential/Platelet, Hemoglobin A1c, Hepatic function panel, Lipid panel, TSH  Family history of hemochromatosis - Plan: IBC + Ferritin  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 Laboratory monitoring today including her iron studies because of her heterozygous hemochromatosis gene and the fact that her son has what sounds like end-stage liver disease from hemochromatosis. Discussed control of the hot flashes that are nocturnal is reasonable to try a lower dose of topical estrogen and realize her patches do not stick can look for other options but they may be more expensive. Her risk benefit is reasonable with lower dose estrogen.  At this time.  We will follow for side effects etc. and may be able to wean to the lowest dose possible. Plan virtual visit in 3 to 4 months. Laboratory monitoring today.  Consider adjusting medicine if her blood pressure is not controlled.  She is tolerating low-dose losartan. Refill alprazolam  Not using daily  Very small amount per dose  No sx so  wait on  Ankle consdier [place]  sm etc  Not compelling reason for eval at this time -Patient advised to return or notify health care team  if  new concerns arise.  Patient Instructions  Will refill losartan and alprazolam.   Begin lower dose  Estrogen patch to try.  For hot flashes.   Will notify you  of labs when available.   Then plan   Fu virtual visit in 3-4 months or as needed.    Standley Brooking. Carlei Huang M.D.

## 2019-09-05 ENCOUNTER — Other Ambulatory Visit: Payer: Self-pay | Admitting: Internal Medicine

## 2019-09-05 NOTE — Telephone Encounter (Signed)
Last OV 06/14/2019  Last filled 06/14/2019, # 45 with 0 refills

## 2019-11-17 ENCOUNTER — Other Ambulatory Visit: Payer: Self-pay

## 2019-11-17 ENCOUNTER — Telehealth: Payer: Self-pay | Admitting: Internal Medicine

## 2019-11-17 MED ORDER — LIDOCAINE 5 % EX PTCH: 1.0000 | MEDICATED_PATCH | CUTANEOUS | 1 refills | Status: DC

## 2019-11-17 NOTE — Telephone Encounter (Signed)
Pt is calling in stating that she needs a refill on lidocaine (LIDODERM) 5% patch b/c the ones that she has is expired.   Pharm:  [facility] [address] in [place]

## 2019-11-17 NOTE — Telephone Encounter (Signed)
Medication has been sent to the pharmacy requested. °

## 2019-11-25 ENCOUNTER — Other Ambulatory Visit: Payer: Self-pay | Admitting: Internal Medicine

## 2019-12-01 ENCOUNTER — Other Ambulatory Visit: Payer: Self-pay | Admitting: Internal Medicine

## 2019-12-01 ENCOUNTER — Other Ambulatory Visit: Payer: Self-pay

## 2019-12-01 NOTE — Telephone Encounter (Signed)
Okay to continue this medication?  Last OV 06/14/2019  Last filled 11/30/2018, # 60 with 3 refills

## 2019-12-02 MED ORDER — ALPRAZOLAM 0.25 MG PO TABS: ORAL_TABLET | ORAL | 0 refills | Status: DC

## 2019-12-02 NOTE — Telephone Encounter (Signed)
Last OV 06/14/2019  Last filled 09/05/2019, # 45 with 0 refills

## 2019-12-26 ENCOUNTER — Other Ambulatory Visit: Payer: Self-pay | Admitting: Internal Medicine

## 2020-01-11 ENCOUNTER — Other Ambulatory Visit: Payer: Self-pay

## 2020-01-11 ENCOUNTER — Telehealth (INDEPENDENT_AMBULATORY_CARE_PROVIDER_SITE_OTHER): Payer: 59 | Admitting: Internal Medicine

## 2020-01-11 ENCOUNTER — Encounter: Payer: Self-pay | Admitting: Internal Medicine

## 2020-01-11 VITALS — Ht 64.0 in | Wt 156.0 lb

## 2020-01-11 DIAGNOSIS — F439 Reaction to severe stress, unspecified: Secondary | ICD-10-CM

## 2020-01-11 DIAGNOSIS — Z79899 Other long term (current) drug therapy: Secondary | ICD-10-CM | POA: Diagnosis not present

## 2020-01-11 DIAGNOSIS — B029 Zoster without complications: Secondary | ICD-10-CM | POA: Diagnosis not present

## 2020-01-11 MED ORDER — VALACYCLOVIR HCL 1 G PO TABS: ORAL_TABLET | ORAL | 3 refills | Status: DC

## 2020-01-11 MED ORDER — TRIAMCINOLONE ACETONIDE 0.1 % EX CREA: 1.0000 "application " | TOPICAL_CREAM | Freq: Two times a day (BID) | CUTANEOUS | 2 refills | Status: AC

## 2020-01-11 NOTE — Progress Notes (Signed)
Virtual Visit via Video Note  I connected with@ on 01/11/20 at  9:30 AM EDT by a video enabled telemedicine application and verified that I am speaking with the correct person using two identifiers. Location patient: home Location provider:work office Persons participating in the virtual visit: patient, provider  [place] national recommendations  regarding COVID 19 pandemic   video visit is advised over in office visit for this patient.  Patient aware  of the limitations of evaluation and management by telemedicine and  availability of in person appointments. and agreed to proceed.   HPI: Karen Spears presents for video visit onset recurrent "shingles left  Upper chest with  Rash and pina burning itching   Similar hx of same  Has been on valtrex in past and seems to get suppression  Lots of stress thinks could be trigger . No fever but not feeling well before began   Last days   ROS: See pertinent positives and negatives per HPI.  Past Medical History:  Diagnosis Date  . Allergic rhinitis   . Allergy   . Anxiety   . Arthritis    hips  . Asthmatic bronchitis 07/30/2012  . Bladder polyps    and stones dr Amalia Hailey   . Bronchospasm 08/20/2012   Much improved on steroid inhaler and removal from old the house. At this time hold on specialty referral consider PFTs in the future maintain on Symbicort for n   . Chronic hip pain    bursitis and arthritis  . Drug rash 05/13/2011   ? Fixed rash  From diflucan  ?   Marland Kitchen Exposure to mold 07/30/2012  . 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 GERD (gastroesophageal reflux disease)    prilosec prn only  . Headache(784.0)   . Hypertension   . MIGRAINE HEADACHE 03/29/2009   Qualifier: Diagnosis of  By: Regis Bill MD, Standley Brooking   . Migraines   . Nerve damage    pelvic floor  . Neuromuscular disorder (HCC)    hips, pelvic floor   . Reaction to severe stress 12/31/2011    Past Surgical  History:  Procedure Laterality Date  . ABDOMINAL HYSTERECTOMY    . CESAREAN SECTION  1985  . vocal cord surgery  1987  . WISDOM TOOTH EXTRACTION      Family History  Problem Relation Age of Onset  . Gallstones Mother   . Colon polyps Mother   . Gallstones Sister   . Bipolar disorder Other        child  . Colon polyps Father   . Colon cancer Neg Hx   . Esophageal cancer Neg Hx   . Rectal cancer Neg Hx   . Stomach cancer Neg Hx     Social History   Tobacco Use  . Smoking status: Never Smoker  . Smokeless tobacco: Never Used  Vaping Use  . Vaping Use: Never used  Substance Use Topics  . Alcohol use: No    Alcohol/week: 0.0 standard drinks  . Drug use: No      Current Outpatient Medications:  .  albuterol (VENTOLIN HFA) 108 (90 Base) MCG/ACT inhaler, Inhale 1-2 puffs into the lungs every 6 (six) hours as needed for wheezing or shortness of breath., Disp: 18 g, Rfl: 3 .  ALPRAZolam (XANAX) 0.25 MG tablet, TAKE 1 AND 1/2 TABLETS BY MOUTH EVERY NIGHT AT BEDTIME AS NEEDED, Disp: 45 tablet, Rfl: 0 .  ALPRAZolam (XANAX) 0.25 MG tablet, TAKE 1 AND 1/2 TABLETS BY MOUTH EVERY NIGHT AT BEDTIME AS NEEDED, Disp: 45 tablet, Rfl: 0 .  ALPRAZolam (XANAX) 0.25 MG tablet, TAKE 1 AND 1/2 TABLETS BY MOUTH EVERY NIGHT AT BEDTIME AS NEEDED, Disp: 45 tablet, Rfl: 0 .  ALPRAZolam (XANAX) 0.25 MG tablet, TAKE 1 AND 1/2 TABLETS BY MOUTH EVERY NIGHT AT BEDTIME AS NEEDED, Disp: 45 tablet, Rfl: 0 .  budesonide-formoterol (SYMBICORT) 160-4.5 MCG/ACT inhaler, Inhale 2 puffs into the lungs 2 (two) times daily., Disp: 1 Inhaler, Rfl: 5 .  estradiol (VIVELLE-DOT) 0.05 MG/24HR patch, Place 1 patch (0.05 mg total) onto the skin 2 (two) times a week. Please schedule your follow up for further refills. [phone], Disp: 8 patch, Rfl: 0 .  ibuprofen (ADVIL) 800 MG tablet, TAKE 1 TABLET BY MOUTH TWICE DAILY AS NEEDED FOR PAIN, Disp: 60 tablet, Rfl: 3 .  ketoprofen (ORUDIS) 50 MG capsule, Take 1 capsule (50 mg  total) by mouth 4 (four) times daily as needed., Disp: 30 capsule, Rfl: 0 .  lidocaine (LIDODERM) 5 %, Place 1 patch onto the skin as directed. Remove & Discard patch within 12 hours or as directed by MD, Disp: 30 patch, Rfl: 1 .  losartan (COZAAR) 25 MG tablet, Take 1 tablet (25 mg total) by mouth daily., Disp: 90 tablet, Rfl: 2 .  metoprolol succinate (TOPROL-XL) 50 MG 24 hr tablet, TAKE 1/2 TABLET BY MOUTH TWICE DAILY AS DIRECTED, Disp: 30 tablet, Rfl: 3 .  rizatriptan (MAXALT) 10 MG tablet, Take 1 tablet (10 mg total) by mouth as needed. May repeat in 2 hours if needed, Disp: 10 tablet, Rfl: 0 .  terconazole (TERAZOL 3) 0.8 % vaginal cream, Place 1 applicator vaginally at bedtime., Disp: 20 g, Rfl: 0 .  promethazine (PHENERGAN) 25 MG suppository, Place 1 suppository (25 mg total) rectally every 6 (six) hours as needed for up to 7 days for nausea. (Patient not taking: Reported on 01/11/2020), Disp: 6 each, Rfl: 0 .  triamcinolone cream (KENALOG) 0.1 %, Apply 1 application topically 2 (two) times daily., Disp: 30 g, Rfl: 2 .  valACYclovir (VALTREX) 1000 MG tablet, Take 1 tablet 3 times daily for 7 days then take 1 daily, Disp: 60 tablet, Rfl: 3  EXAM: BP Readings from Last 3 Encounters:  06/14/19 128/80  05/18/18 138/72  03/02/18 124/70    VITALS per patient if applicable:  GENERAL: alert, oriented, appears well and in no acute distress  HEENT: atraumatic, conjunttiva clear, no obvious abnormalities on inspection of external nose and ears  NECK: normal movements of the head and neck  LUNGS: on inspection no signs of respiratory distress, breathing rate appears normal, no obvious gross SOB, gasping or wheezing Skin  Patch or red patch left upper chest    CV: no obvious cyanosis  MS: moves all visible extremities without noticeable abnormality  PSYCH/NEURO: pleasant and cooperative,  speech and thought processing grossly intact Lab Results  Component Value Date   WBC 4.7 06/14/2019    HGB 13.3 06/14/2019   HCT 39.0 06/14/2019   PLT 218.0 06/14/2019   GLUCOSE 99 06/14/2019   CHOL 183 06/14/2019   TRIG 55.0 06/14/2019   HDL 45.10 06/14/2019   LDLCALC 127 (H) 06/14/2019   ALT 22 06/14/2019   AST 17 06/14/2019   NA 139 06/14/2019   K 4.2 06/14/2019   CL 106 06/14/2019   CREATININE 0.73 06/14/2019   BUN 20 06/14/2019   CO2 28 06/14/2019  TSH 2.60 06/14/2019   HGBA1C 5.5 06/14/2019    ASSESSMENT AND PLAN:  Discussed the following assessment and plan:    ICD-10-CM   1. 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 Medication management  Z79.899   3. Stress  F43.9     Counseled.   Out of work for 2 weeks and then reassess  Any other restrictions   Plan antiviral and  Daily suppression I( if hsv)  If  persistent or progressive may need visit to check rash in person   Expectant management and discussion of plan and treatment with opportunity to ask questions and all were answered. The patient agreed with the plan and demonstrated an understanding of the instructions.   Advised to call back or seek an in-person evaluation if worsening  or having  further concerns . Return if symptoms worsen or fail to improve, for or 2 weeks  assessment if needed .    Shanon Ace, MD

## 2020-01-30 ENCOUNTER — Other Ambulatory Visit: Payer: Self-pay

## 2020-01-31 MED ORDER — ESTRADIOL 0.05 MG/24HR TD PTTW: 1.0000 | MEDICATED_PATCH | TRANSDERMAL | 0 refills | Status: DC

## 2020-01-31 MED ORDER — ALPRAZOLAM 0.25 MG PO TABS: ORAL_TABLET | ORAL | 0 refills | Status: DC

## 2020-01-31 NOTE — Telephone Encounter (Signed)
Last OV 01/11/2020  Last filled 12/02/2019, # 45 with 0 refills

## 2020-02-27 ENCOUNTER — Other Ambulatory Visit: Payer: Self-pay | Admitting: Internal Medicine

## 2020-03-15 ENCOUNTER — Encounter: Payer: Self-pay | Admitting: Internal Medicine

## 2020-03-15 ENCOUNTER — Telehealth: Payer: 59 | Admitting: Internal Medicine

## 2020-03-15 VITALS — BP 129/78 | HR 77 | Temp 97.1°F

## 2020-03-15 DIAGNOSIS — R058 Other specified cough: Secondary | ICD-10-CM

## 2020-03-15 DIAGNOSIS — J0191 Acute recurrent sinusitis, unspecified: Secondary | ICD-10-CM

## 2020-03-15 DIAGNOSIS — Z79899 Other long term (current) drug therapy: Secondary | ICD-10-CM

## 2020-03-15 MED ORDER — IBUPROFEN 800 MG PO TABS: 800.0000 mg | ORAL_TABLET | Freq: Two times a day (BID) | ORAL | 3 refills | Status: DC | PRN

## 2020-03-15 MED ORDER — DOXYCYCLINE HYCLATE 100 MG PO TABS
100.0000 mg | ORAL_TABLET | Freq: Two times a day (BID) | ORAL | 0 refills | Status: DC
Start: 2020-03-15 — End: 2020-11-26

## 2020-03-15 MED ORDER — TERCONAZOLE 0.8 % VA CREA: 1.0000 | TOPICAL_CREAM | Freq: Every day | VAGINAL | 1 refills | Status: AC

## 2020-03-15 NOTE — Progress Notes (Signed)
Virtual Visit via Video Note  I connected with@ on 03/15/20 at  8:30 AM EDT by a video enabled telemedicine application and verified that I am speaking with the correct person using two identifiers. Location patient: home Location provider: home office Persons participating in the virtual visit: patient, provider  [place] national recommendations  regarding COVID 19 pandemic   video visit is advised over in office visit for this patient.  Patient aware  of the limitations of evaluation and management by telemedicine and  availability of in person appointments. and agreed to proceed.   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  But may be  feeling achy  Temp 97  Last 4 days worse  Facial pain pressure   Cough and now green  Mucous nasal and from cough . No sob   Had j and j covid  vaccine  In March ROS: See pertinent positives and negatives per HPI. Tired some sneezing   Past Medical History:  Diagnosis Date  . Allergic rhinitis   . Allergy   . Anxiety   . Arthritis    hips  . Asthmatic bronchitis 07/30/2012  . Bladder polyps    and stones dr Amalia Hailey   . Bronchospasm 08/20/2012   Much improved on steroid inhaler and removal from old the house. At this time hold on specialty referral consider PFTs in the future maintain on Symbicort for n   . Chronic hip pain    bursitis and arthritis  . Drug rash 05/13/2011   ? Fixed rash  From diflucan  ?   Marland Kitchen Exposure to mold 07/30/2012  . Female pelvic pain    after hysterectomy ileoinguinal nerve 2/08 saw Dr Brien Few [facility] in the past  . GERD (gastroesophageal reflux disease)    prilosec prn only  . Headache(784.0)   . Hypertension   . MIGRAINE HEADACHE 03/29/2009   Qualifier: Diagnosis of  By: Regis Bill MD, Standley Brooking   . Migraines   . Nerve damage    pelvic floor  . Neuromuscular disorder (HCC)    hips, pelvic  floor   . Reaction to severe stress 12/31/2011    Past Surgical History:  Procedure Laterality Date  . ABDOMINAL HYSTERECTOMY    . CESAREAN SECTION  1985  . vocal cord surgery  1987  . WISDOM TOOTH EXTRACTION      Family History  Problem Relation Age of Onset  . Gallstones Mother   . Colon polyps Mother   . Gallstones Sister   . Bipolar disorder Other        child  . Colon polyps Father   . Colon cancer Neg Hx   . Esophageal cancer Neg Hx   . Rectal cancer Neg Hx   . Stomach cancer Neg Hx     Social History   Tobacco Use  . Smoking status: Never Smoker  . Smokeless tobacco: Never Used  Vaping Use  . Vaping Use: Never used  Substance Use Topics  . Alcohol use: No    Alcohol/week: 0.0 standard drinks  . Drug use: No      Current Outpatient Medications:  .  ALPRAZolam (XANAX) 0.25 MG tablet, TAKE 1 AND 1/2 TABLETS BY MOUTH EVERY NIGHT AT BEDTIME AS NEEDED, Disp: 45 tablet, Rfl: 0 .  ALPRAZolam (XANAX) 0.25 MG tablet, TAKE 1 AND  1/2 TABLETS BY MOUTH EVERY NIGHT AT BEDTIME AS NEEDED, Disp: 45 tablet, Rfl: 0 .  ALPRAZolam (XANAX) 0.25 MG tablet, TAKE 1 AND 1/2 TABLETS BY MOUTH EVERY NIGHT AT BEDTIME AS NEEDED, Disp: 45 tablet, Rfl: 0 .  ALPRAZolam (XANAX) 0.25 MG tablet, TAKE 1 AND 1/2 TABLETS BY MOUTH EVERY NIGHT AT BEDTIME AS NEEDED, Disp: 45 tablet, Rfl: 0 .  budesonide-formoterol (SYMBICORT) 160-4.5 MCG/ACT inhaler, Inhale 2 puffs into the lungs 2 (two) times daily., Disp: 1 Inhaler, Rfl: 5 .  estradiol (VIVELLE-DOT) 0.05 MG/24HR patch, Place 1 patch (0.05 mg total) onto the skin 2 (two) times a week., Disp: 24 patch, Rfl: 0 .  ibuprofen (ADVIL) 800 MG tablet, Take 1 tablet (800 mg total) by mouth 2 (two) times daily as needed. for pain, Disp: 60 tablet, Rfl: 3 .  lidocaine (LIDODERM) 5 %, Place 1 patch onto the skin as directed. Remove & Discard patch within 12 hours or as directed by MD, Disp: 30 patch, Rfl: 1 .  losartan (COZAAR) 25 MG tablet, TAKE 1 TABLET(25 MG) BY  MOUTH DAILY, Disp: 90 tablet, Rfl: 2 .  metoprolol succinate (TOPROL-XL) 50 MG 24 hr tablet, TAKE 1/2 TABLET BY MOUTH TWICE DAILY AS DIRECTED, Disp: 30 tablet, Rfl: 3 .  rizatriptan (MAXALT) 10 MG tablet, Take 1 tablet (10 mg total) by mouth as needed. May repeat in 2 hours if needed, Disp: 10 tablet, Rfl: 0 .  terconazole (TERAZOL 3) 0.8 % vaginal cream, Place 1 applicator vaginally at bedtime., Disp: 20 g, Rfl: 1 .  triamcinolone cream (KENALOG) 0.1 %, Apply 1 application topically 2 (two) times daily., Disp: 30 g, Rfl: 2 .  valACYclovir (VALTREX) 1000 MG tablet, Take 1 tablet 3 times daily for 7 days then take 1 daily, Disp: 60 tablet, Rfl: 3 .  albuterol (VENTOLIN HFA) 108 (90 Base) MCG/ACT inhaler, Inhale 1-2 puffs into the lungs every 6 (six) hours as needed for wheezing or shortness of breath. (Patient not taking: Reported on 03/15/2020), Disp: 18 g, Rfl: 3 .  doxycycline (VIBRA-TABS) 100 MG tablet, Take 1 tablet (100 mg total) by mouth 2 (two) times daily., Disp: 14 tablet, Rfl: 0 .  ketoprofen (ORUDIS) 50 MG capsule, Take 1 capsule (50 mg total) by mouth 4 (four) times daily as needed. (Patient not taking: Reported on 03/15/2020), Disp: 30 capsule, Rfl: 0 .  promethazine (PHENERGAN) 25 MG suppository, Place 1 suppository (25 mg total) rectally every 6 (six) hours as needed for up to 7 days for nausea. (Patient not taking: Reported on 01/11/2020), Disp: 6 each, Rfl: 0  EXAM: BP Readings from Last 3 Encounters:  03/15/20 129/78  06/14/19 128/80  05/18/18 138/72    VITALS per patient if applicable:  GENERAL: alert, oriented, appears well and in no acute distress non t oxic  Looks congested  No edema  HEENT: atraumatic, conjunttiva clear, no obvious abnormalities on inspection of external nose and ears  NECK: normal movements of the head and neck  LUNGS: on inspection no signs of respiratory distress, breathing rate appears normal, no obvious gross SOB, gasping or wheezing ocass cough    CV: no obvious cyanosis  MS: moves all visible extremities without noticeable abnormality  PSYCH/NEURO: pleasant and cooperative, no obvious depression or anxiety, speech and thought processing grossly intact Lab Results  Component Value Date   WBC 4.7 06/14/2019   HGB 13.3 06/14/2019   HCT 39.0 06/14/2019   PLT 218.0 06/14/2019   GLUCOSE 99 06/14/2019   CHOL  183 06/14/2019   TRIG 55.0 06/14/2019   HDL 45.10 06/14/2019   LDLCALC 127 (H) 06/14/2019   ALT 22 06/14/2019   AST 17 06/14/2019   NA 139 06/14/2019   K 4.2 06/14/2019   CL 106 06/14/2019   CREATININE 0.73 06/14/2019   BUN 20 06/14/2019   CO2 28 06/14/2019   TSH 2.60 06/14/2019   HGBA1C 5.5 06/14/2019    ASSESSMENT AND PLAN:  Discussed the following assessment and plan:    ICD-10-CM   1. Acute recurrent sinusitis, unspecified location  J01.91   2. Medication management  Z79.899   3. Respiratory tract congestion with cough  R05.8    prob secondary infection sinusitis  Etc  Hx of same   Get Covid 19 tested    Would advise mcaby if positive . If neg  Plan booster with a mrna  vaccine  When improving  Counseled.  Saline sinus hygiene  fu with alarm sx  requestes anti years med since antibiotic refill terazole   Expectant management and discussion of plan and treatment with opportunity to ask questions and all were answered. The patient agreed with the plan and demonstrated an understanding of the instructions.   Advised to call back or seek an in-person evaluation if worsening  or having  further concerns . Return if symptoms worsen or fail to improve as expected.   Shanon Ace, MD

## 2020-03-26 ENCOUNTER — Other Ambulatory Visit: Payer: Self-pay | Admitting: Internal Medicine

## 2020-04-11 ENCOUNTER — Other Ambulatory Visit: Payer: Self-pay | Admitting: Internal Medicine

## 2020-04-12 NOTE — Telephone Encounter (Signed)
Last seen 03/15/2020

## 2020-06-13 ENCOUNTER — Other Ambulatory Visit: Payer: Self-pay | Admitting: Internal Medicine

## 2020-06-13 NOTE — Telephone Encounter (Signed)
Last refill--01/31/2019 Last video visit-- 01/11/2020

## 2020-07-07 ENCOUNTER — Other Ambulatory Visit: Payer: Self-pay | Admitting: Internal Medicine

## 2020-07-22 ENCOUNTER — Other Ambulatory Visit: Payer: Self-pay | Admitting: Internal Medicine

## 2020-07-23 ENCOUNTER — Other Ambulatory Visit: Payer: Self-pay | Admitting: Internal Medicine

## 2020-08-30 ENCOUNTER — Other Ambulatory Visit: Payer: Self-pay | Admitting: Internal Medicine

## 2020-08-30 NOTE — Telephone Encounter (Signed)
Last OV: 03/15/2020 Last refill: 06/13/20 Future OV: None scheduled

## 2020-09-19 ENCOUNTER — Other Ambulatory Visit: Payer: Self-pay | Admitting: Internal Medicine

## 2020-10-09 ENCOUNTER — Other Ambulatory Visit: Payer: Self-pay | Admitting: Internal Medicine

## 2020-10-10 NOTE — Telephone Encounter (Signed)
Since it has been a year that she had an in person visit I believe Have her make a yearly visit for med check or physical in the next couple months Then refill 3 months worth of medicine

## 2020-10-11 NOTE — Telephone Encounter (Signed)
I spoke with the patient and she stated she would contact the office at a later time to schedule appointment.

## 2020-10-15 ENCOUNTER — Other Ambulatory Visit: Payer: Self-pay | Admitting: Internal Medicine

## 2020-11-16 ENCOUNTER — Other Ambulatory Visit: Payer: Self-pay | Admitting: Internal Medicine

## 2020-11-26 ENCOUNTER — Ambulatory Visit (INDEPENDENT_AMBULATORY_CARE_PROVIDER_SITE_OTHER): Payer: 59 | Admitting: Internal Medicine

## 2020-11-26 ENCOUNTER — Other Ambulatory Visit: Payer: Self-pay

## 2020-11-26 ENCOUNTER — Encounter: Payer: Self-pay | Admitting: Internal Medicine

## 2020-11-26 VITALS — BP 128/80 | HR 59 | Temp 97.7°F | Ht 64.0 in | Wt 158.0 lb

## 2020-11-26 DIAGNOSIS — Z79899 Other long term (current) drug therapy: Secondary | ICD-10-CM | POA: Diagnosis not present

## 2020-11-26 DIAGNOSIS — Z974 Presence of external hearing-aid: Secondary | ICD-10-CM

## 2020-11-26 DIAGNOSIS — Z0000 Encounter for general adult medical examination without abnormal findings: Secondary | ICD-10-CM

## 2020-11-26 DIAGNOSIS — G4700 Insomnia, unspecified: Secondary | ICD-10-CM

## 2020-11-26 DIAGNOSIS — G43909 Migraine, unspecified, not intractable, without status migrainosus: Secondary | ICD-10-CM | POA: Diagnosis not present

## 2020-11-26 DIAGNOSIS — I10 Essential (primary) hypertension: Secondary | ICD-10-CM

## 2020-11-26 DIAGNOSIS — Z79890 Hormone replacement therapy (postmenopausal): Secondary | ICD-10-CM | POA: Diagnosis not present

## 2020-11-26 DIAGNOSIS — Z833 Family history of diabetes mellitus: Secondary | ICD-10-CM

## 2020-11-26 MED ORDER — LIDOCAINE 5 % EX PTCH: 1.0000 | MEDICATED_PATCH | CUTANEOUS | 1 refills | Status: AC

## 2020-11-26 MED ORDER — PROMETHAZINE HCL 25 MG RE SUPP: 25.0000 mg | Freq: Four times a day (QID) | RECTAL | 1 refills | Status: DC | PRN

## 2020-11-26 MED ORDER — ALPRAZOLAM 0.25 MG PO TABS: ORAL_TABLET | ORAL | 1 refills | Status: DC

## 2020-11-26 NOTE — Patient Instructions (Addendum)
Glad you are doing ok. Will refill lidocaine phenergan and alprazolam    6 months med check can be virtual or  in person.   Health Maintenance, Female Adopting a healthy lifestyle and getting preventive care are important in promoting health and wellness. Ask your health care provider about: The right schedule for you to have regular tests and exams. Things you can do on your own to prevent diseases and keep yourself healthy. What should I know about diet, weight, and exercise? Eat a healthy diet  Eat a diet that includes plenty of vegetables, fruits, low-fat dairy products, and lean protein. Do not eat a lot of foods that are high in solid fats, added sugars, or sodium.  Maintain a healthy weight Body mass index (BMI) is used to identify weight problems. It estimates body fat based on height and weight. Your health care provider can help determineyour BMI and help you achieve or maintain a healthy weight. Get regular exercise Get regular exercise. This is one of the most important things you can do for your health. Most adults should: Exercise for at least 150 minutes each week. The exercise should increase your heart rate and make you sweat (moderate-intensity exercise). Do strengthening exercises at least twice a week. This is in addition to the moderate-intensity exercise. Spend less time sitting. Even light physical activity can be beneficial. Watch cholesterol and blood lipids Have your blood tested for lipids and cholesterol at 64 years of age, then havethis test every 5 years. Have your cholesterol levels checked more often if: Your lipid or cholesterol levels are high. You are older than 64 years of age. You are at high risk for heart disease. What should I know about cancer screening? Depending on your health history and family history, you may need to have cancer screening at various ages. This may include screening for: Breast cancer. Cervical cancer. Colorectal  cancer. Skin cancer. Lung cancer. What should I know about heart disease, diabetes, and high blood pressure? Blood pressure and heart disease High blood pressure causes heart disease and increases the risk of stroke. This is more likely to develop in people who have high blood pressure readings, are of African descent, or are overweight. Have your blood pressure checked: Every 3-5 years if you are 49-69 years of age. Every year if you are 40 years old or older. Diabetes Have regular diabetes screenings. This checks your fasting blood sugar level. Have the screening done: Once every three years after age 64 if you are at a normal weight and have a low risk for diabetes. More often and at a younger age if you are overweight or have a high risk for diabetes. What should I know about preventing infection? Hepatitis B If you have a higher risk for hepatitis B, you should be screened for this virus. Talk with your health care provider to find out if you are at risk forhepatitis B infection. Hepatitis C Testing is recommended for: Everyone born from [birth year] through 1964. Anyone with known risk factors for hepatitis C. Sexually transmitted infections (STIs) Get screened for STIs, including gonorrhea and chlamydia, if: You are sexually active and are younger than 64 years of age. You are older than 64 years of age and your health care provider tells you that you are at risk for this type of infection. Your sexual activity has changed since you were last screened, and you are at increased risk for chlamydia or gonorrhea. Ask your health care provider if you are  at risk. Ask your health care provider about whether you are at high risk for HIV. Your health care provider may recommend a prescription medicine to help prevent HIV infection. If you choose to take medicine to prevent HIV, you should first get tested for HIV. You should then be tested every 3 months for as long as you are taking the  medicine. Pregnancy If you are about to stop having your period (premenopausal) and you may become pregnant, seek counseling before you get pregnant. Take 400 to 800 micrograms (mcg) of folic acid every day if you become pregnant. Ask for birth control (contraception) if you want to prevent pregnancy. Osteoporosis and menopause Osteoporosis is a disease in which the bones lose minerals and strength with aging. This can result in bone fractures. If you are 71 years old or older, or if you are at risk for osteoporosis and fractures, ask your health care provider if you should: Be screened for bone loss. Take a calcium or vitamin D supplement to lower your risk of fractures. Be given hormone replacement therapy (HRT) to treat symptoms of menopause. Follow these instructions at home: Lifestyle Do not use any products that contain nicotine or tobacco, such as cigarettes, e-cigarettes, and chewing tobacco. If you need help quitting, ask your health care provider. Do not use street drugs. Do not share needles. Ask your health care provider for help if you need support or information about quitting drugs. Alcohol use Do not drink alcohol if: Your health care provider tells you not to drink. You are pregnant, may be pregnant, or are planning to become pregnant. If you drink alcohol: Limit how much you use to 0-1 drink a day. Limit intake if you are breastfeeding. Be aware of how much alcohol is in your drink. In the [place] one drink equals one 12 oz bottle of beer (355 mL), one 5 oz glass of wine (148 mL), or one 1 oz glass of hard liquor (44 mL). General instructions Schedule regular health, dental, and eye exams. Stay current with your vaccines. Tell your health care provider if: You often feel depressed. You have ever been abused or do not feel safe at home. Summary Adopting a healthy lifestyle and getting preventive care are important in promoting health and wellness. Follow your health  care provider's instructions about healthy diet, exercising, and getting tested or screened for diseases. Follow your health care provider's instructions on monitoring your cholesterol and blood pressure. This information is not intended to replace advice given to you by your health care provider. Make sure you discuss any questions you have with your healthcare provider. Document Revised: 04/21/2018 Document Reviewed: 04/21/2018 Elsevier Patient Education  2022 [employer].

## 2020-11-26 NOTE — Progress Notes (Signed)
Chief Complaint  Patient presents with   Annual Exam    HPI: Patient  Karen Spears  65 y.o. comes in today for [place] visit and medication evaluation No major changes in health is actually doing fairly well.  Sleep cannot sleep without the alprazolam at night sometimes has nightmares or vivid dreams but is doing okay  Bp not checking unless has ha is much less now headaches are better since she has had bridgework Lidocaine patch  not a lot of use . But needs to have .  Refill   to have on hand last refill she expired and did not get to fill it. Phenrgan supp to have on hand but has are better Pressure has been good Respiratory no flareup of an asthmatic nature under current situation. Continuing hormone replacement helpful.  Health Maintenance  Topic Date Due   Pneumococcal Vaccine 51-53 Years old (1 - PCV) Never done   HIV Screening  Never done   Zoster Vaccines- Shingrix (1 of 2) Never done   TETANUS/TDAP  09/20/2018   COVID-19 Vaccine (2 - Janssen risk series) 08/12/2019   MAMMOGRAM  12/07/2020   INFLUENZA VACCINE  12/10/2020   COLONOSCOPY (Pts 45-41yrs Insurance coverage will need to be confirmed)  03/06/2026   Hepatitis C Screening  Completed   HPV VACCINES  Aged Out   Health Maintenance Review LIFESTYLE:  Exercise:   stationary bike  at [place] Tobacco/ETS:n Alcohol: n Sugar beverages:  Coke  16 oz or less  Sleep: alprazolam   for sleep.  Dreams.   Bafd at times.  Drug use: no HH of  2  Work: 24 x 7   maybe one off per week  /retiring  next June  married 45 years  ROS:  when lays on right side hard to breath  but  no sob heart sx with bike or significant exercise  GREST of 12 system review negative except as per HPI   Past Medical History:  Diagnosis Date   Allergic rhinitis    Allergy    Anxiety    Arthritis    hips   Asthmatic bronchitis 07/30/2012   Bladder polyps    and stones dr Amalia Hailey    Bronchospasm 08/20/2012   Much  improved on steroid inhaler and removal from old the house. At this time hold on specialty referral consider PFTs in the future maintain on Symbicort for n    Chronic hip pain    bursitis and arthritis   Drug rash 05/13/2011   ? Fixed rash  From diflucan  ?    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 saw Dr Brien Few [facility] in the past   GERD (gastroesophageal reflux disease)    prilosec prn only   Headache(784.0)    Hypertension    MIGRAINE HEADACHE 03/29/2009   Qualifier: Diagnosis of  By: Regis Bill MD, Standley Brooking    Migraines    Nerve damage    pelvic floor   Neuromuscular disorder (HCC)    hips, pelvic floor    Reaction to severe stress 12/31/2011    Past Surgical History:  Procedure Laterality Date   ABDOMINAL HYSTERECTOMY     CESAREAN SECTION  1985   vocal cord surgery  1987   WISDOM TOOTH EXTRACTION      Family History  Problem Relation Age of Onset   Gallstones Mother    Colon polyps Mother  Gallstones Sister    Bipolar disorder Other        child   Colon polyps Father    Colon cancer Neg Hx    Esophageal cancer Neg Hx    Rectal cancer Neg Hx    Stomach cancer Neg Hx     Social History   Socioeconomic History   Marital status: Married    Spouse name: Not on file   Number of children: Not on file   Years of education: Not on file   Highest education level: Not on file  Occupational History   Not on file  Tobacco Use   Smoking status: Never   Smokeless tobacco: Never  Vaping Use   Vaping Use: Never used  Substance and Sexual Activity   Alcohol use: No    Alcohol/week: 0.0 standard drinks   Drug use: No   Sexual activity: Not on file  Other Topics Concern   Not on file  Social History Narrative   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    Has [employer]    Married   Regular exercise- no some   Has grandchildren   Hs farm Gaffer building house   A  child is bipolar         Social Determinants of Radio broadcast assistant Strain: Not on Art therapist Insecurity: Not on file  Transportation Needs: Not on file  Physical Activity: Not on file  Stress: Not on file  Social Connections: Not on file    Outpatient Medications Prior to Visit  Medication Sig Dispense Refill   albuterol (VENTOLIN HFA) 108 (90 Base) MCG/ACT inhaler Inhale 1-2 puffs into the lungs every 6 (six) hours as needed for wheezing or shortness of breath. 18 g 3   estradiol (VIVELLE-DOT) 0.05 MG/24HR patch APPLY 1 PATCH(0.05 MG) TOPICALLY TO THE SKIN 2 TIMES A WEEK 24 patch 0   ibuprofen (ADVIL) 800 MG tablet TAKE 1 TABLET(800 MG) BY MOUTH TWICE DAILY AS NEEDED FOR PAIN 60 tablet 3   ketoprofen (ORUDIS) 50 MG capsule Take 1 capsule (50 mg total) by mouth 4 (four) times daily as needed. 30 capsule 0   losartan (COZAAR) 25 MG tablet TAKE 1 TABLET(25 MG) BY MOUTH DAILY 90 tablet 0   metoprolol succinate (TOPROL-XL) 50 MG 24 hr tablet TAKE 1/2 TABLET BY MOUTH TWICE DAILY AS DIRECTED 30 tablet 1   rizatriptan (MAXALT) 10 MG tablet Take 1 tablet (10 mg total) by mouth as needed. May repeat in 2 hours if needed 10 tablet 0   terconazole (TERAZOL 3) 0.8 % vaginal cream Place 1 applicator vaginally at bedtime. 20 g 1   triamcinolone cream (KENALOG) 0.1 % Apply 1 application topically 2 (two) times daily. 30 g 2   valACYclovir (VALTREX) 1000 MG tablet Take 1 tablet 3 times daily for 7 days then take 1 daily 60 tablet 3   ALPRAZolam (XANAX) 0.25 MG tablet TAKE 1 AND 1/2 TABLETS BY MOUTH EVERY NIGHT AT BEDTIME AS NEEDED 45 tablet 0   ALPRAZolam (XANAX) 0.25 MG tablet TAKE 1 AND 1/2 TABLETS BY MOUTH EVERY NIGHT AT BEDTIME AS NEEDED 45 tablet 0   ALPRAZolam (XANAX) 0.25 MG tablet TAKE 1 AND 1/2 TABLETS BY MOUTH EVERY NIGHT AT BEDTIME AS NEEDED 45 tablet 0   ALPRAZolam (XANAX) 0.25 MG tablet TAKE 1 AND 1/2 TABLETS BY MOUTH EVERY NIGHT AT BEDTIME AS NEEDED 45 tablet 0   lidocaine  (LIDODERM) 5 % Place  1 patch onto the skin as directed. Remove & Discard patch within 12 hours or as directed by MD 30 patch 1   budesonide-formoterol (SYMBICORT) 160-4.5 MCG/ACT inhaler Inhale 2 puffs into the lungs 2 (two) times daily. (Patient not taking: Reported on 11/26/2020) 1 Inhaler 5   doxycycline (VIBRA-TABS) 100 MG tablet Take 1 tablet (100 mg total) by mouth 2 (two) times daily. 14 tablet 0   promethazine (PHENERGAN) 25 MG suppository Place 1 suppository (25 mg total) rectally every 6 (six) hours as needed for up to 7 days for nausea. (Patient not taking: Reported on 01/11/2020) 6 each 0   No facility-administered medications prior to visit.     EXAM:  BP 128/80 (BP Location: Left Arm, Patient Position: Sitting, Cuff Size: Normal)   Pulse (!) 59   Temp 97.7 F (36.5 C) (Oral)   Ht 5\' 4"  (1.626 m)   Wt 158 lb (71.7 kg)   SpO2 97%   BMI 27.12 kg/m   Body mass index is 27.12 kg/m. Wt Readings from Last 3 Encounters:  11/26/20 158 lb (71.7 kg)  01/11/20 156 lb (70.8 kg)  06/14/19 154 lb 12.8 oz (70.2 kg)    Physical Exam: Vital signs reviewed CHE:NIDP is a well-developed well-nourished alert cooperative    who appearsr stated age in no acute distress.  HEENT: normocephalic atraumatic , Eyes: PERRL EOM's full, conjunctiva clear, Nares: paten,t no deformity discharge or tenderness., Ears: no deformity EAC's clear TMs with normal landmarks. Mouth:masked  NECK: supple without masses, thyromegaly or bruits. No jvd  CHEST/PULM:  Clear to auscultation and percussion breath sounds equal no wheeze , rales or rhonchi. No chest wall deformities or tenderness. Breast: normal by inspection . No dimpling, discharge, masses, tenderness or discharge . CV: PMI is nondisplaced, S1 S2 no gallops, murmurs, rubs. Peripheral pulses are full without delay.No JVD .  ABDOMEN: Bowel sounds normal nontender  No guard or rebound, no hepato splenomegal no CVA tenderness.  No hernia. Extremtities:  No  clubbing cyanosis or edema, no acute joint swelling or redness no focal atrophy ankls puffy but no sig edema  NEURO:  Oriented x3, cranial nerves 3-12 appear to be intact, no obvious focal weakness,gait within normal limits no abnormal reflexes or asymmetrical SKIN: No acute rashes normal turgor, color, no bruising or petechiae. PSYCH: Oriented, good eye contact, no obvious depression anxiety, cognition and judgment appear normal. LN: no cervical axillary inguinal adenopathy  Lab Results  Component Value Date   WBC 4.7 06/14/2019   HGB 13.3 06/14/2019   HCT 39.0 06/14/2019   PLT 218.0 06/14/2019   GLUCOSE 99 06/14/2019   CHOL 183 06/14/2019   TRIG 55.0 06/14/2019   HDL 45.10 06/14/2019   LDLCALC 127 (H) 06/14/2019   ALT 22 06/14/2019   AST 17 06/14/2019   NA 139 06/14/2019   K 4.2 06/14/2019   CL 106 06/14/2019   CREATININE 0.73 06/14/2019   BUN 20 06/14/2019   CO2 28 06/14/2019   TSH 2.60 06/14/2019   HGBA1C 5.5 06/14/2019    BP Readings from Last 3 Encounters:  11/26/20 128/80  03/15/20 129/78  06/14/19 128/80    Lab plan reviewed with patient  is fasting ( in afternoon)   ASSESSMENT AND PLAN:  Discussed the following assessment and plan:    ICD-10-CM   1. Visit for preventive health examination  O24.23 Basic metabolic panel    CBC with Differential/Platelet    Hemoglobin A1c    Hepatic function panel  Lipid panel    TSH    TSH    Lipid panel    Hepatic function panel    Hemoglobin A1c    CBC with Differential/Platelet    Basic metabolic panel    2. Medication management  O96.295 Basic metabolic panel    CBC with Differential/Platelet    Hemoglobin A1c    Hepatic function panel    Lipid panel    TSH    TSH    Lipid panel    Hepatic function panel    Hemoglobin A1c    CBC with Differential/Platelet    Basic metabolic panel    3. Insomnia, unspecified type  M84.13 Basic metabolic panel    CBC with Differential/Platelet    Hemoglobin A1c     Hepatic function panel    Lipid panel    TSH    TSH    Lipid panel    Hepatic function panel    Hemoglobin A1c    CBC with Differential/Platelet    Basic metabolic panel    4. Postmenopausal HRT (hormone replacement therapy)  K44.010 Basic metabolic panel    CBC with Differential/Platelet    Hemoglobin A1c    Hepatic function panel    Lipid panel    TSH    TSH    Lipid panel    Hepatic function panel    Hemoglobin A1c    CBC with Differential/Platelet    Basic metabolic panel    5. Migraine without status migrainosus, not intractable, unspecified migraine type  U72.536 Basic metabolic panel    CBC with Differential/Platelet    Hemoglobin A1c    Hepatic function panel    Lipid panel    TSH    TSH    Lipid panel    Hepatic function panel    Hemoglobin A1c    CBC with Differential/Platelet    Basic metabolic panel   in remission since had bridge work  but needs med to have on hand     6. Hearing aid worn  Z97.4     7. Family history of diabetes mellitus  Z83.3     8. Essential hypertension  I10    controlled     Benefit more than risk of medications  to continue.at this time    Return in about 6 months (around 05/29/2021) for depending on results, medication.  Patient Care Team: Burnis Medin, MD as PCP - General Patient Instructions  Glad you are doing ok. Will refill lidocaine phenergan and alprazolam    6 months med check can be virtual or  in person.   Health Maintenance, Female Adopting a healthy lifestyle and getting preventive care are important in promoting health and wellness. Ask your health care provider about: The right schedule for you to have regular tests and exams. Things you can do on your own to prevent diseases and keep yourself healthy. What should I know about diet, weight, and exercise? Eat a healthy diet  Eat a diet that includes plenty of vegetables, fruits, low-fat dairy products, and lean protein. Do not eat a lot of foods that  are high in solid fats, added sugars, or sodium.  Maintain a healthy weight Body mass index (BMI) is used to identify weight problems. It estimates body fat based on height and weight. Your health care provider can help determineyour BMI and help you achieve or maintain a healthy weight. Get regular exercise Get regular exercise. This is one of the most important things  you can do for your health. Most adults should: Exercise for at least 150 minutes each week. The exercise should increase your heart rate and make you sweat (moderate-intensity exercise). Do strengthening exercises at least twice a week. This is in addition to the moderate-intensity exercise. Spend less time sitting. Even light physical activity can be beneficial. Watch cholesterol and blood lipids Have your blood tested for lipids and cholesterol at 64 years of age, then havethis test every 5 years. Have your cholesterol levels checked more often if: Your lipid or cholesterol levels are high. You are older than 64 years of age. You are at high risk for heart disease. What should I know about cancer screening? Depending on your health history and family history, you may need to have cancer screening at various ages. This may include screening for: Breast cancer. Cervical cancer. Colorectal cancer. Skin cancer. Lung cancer. What should I know about heart disease, diabetes, and high blood pressure? Blood pressure and heart disease High blood pressure causes heart disease and increases the risk of stroke. This is more likely to develop in people who have high blood pressure readings, are of African descent, or are overweight. Have your blood pressure checked: Every 3-5 years if you are 22-71 years of age. Every year if you are 2 years old or older. Diabetes Have regular diabetes screenings. This checks your fasting blood sugar level. Have the screening done: Once every three years after age 3 if you are at a normal weight  and have a low risk for diabetes. More often and at a younger age if you are overweight or have a high risk for diabetes. What should I know about preventing infection? Hepatitis B If you have a higher risk for hepatitis B, you should be screened for this virus. Talk with your health care provider to find out if you are at risk forhepatitis B infection. Hepatitis C Testing is recommended for: Everyone born from [birth year] through 1964. Anyone with known risk factors for hepatitis C. Sexually transmitted infections (STIs) Get screened for STIs, including gonorrhea and chlamydia, if: You are sexually active and are younger than 64 years of age. You are older than 64 years of age and your health care provider tells you that you are at risk for this type of infection. Your sexual activity has changed since you were last screened, and you are at increased risk for chlamydia or gonorrhea. Ask your health care provider if you are at risk. Ask your health care provider about whether you are at high risk for HIV. Your health care provider may recommend a prescription medicine to help prevent HIV infection. If you choose to take medicine to prevent HIV, you should first get tested for HIV. You should then be tested every 3 months for as long as you are taking the medicine. Pregnancy If you are about to stop having your period (premenopausal) and you may become pregnant, seek counseling before you get pregnant. Take 400 to 800 micrograms (mcg) of folic acid every day if you become pregnant. Ask for birth control (contraception) if you want to prevent pregnancy. Osteoporosis and menopause Osteoporosis is a disease in which the bones lose minerals and strength with aging. This can result in bone fractures. If you are 24 years old or older, or if you are at risk for osteoporosis and fractures, ask your health care provider if you should: Be screened for bone loss. Take a calcium or vitamin D supplement to lower  your risk  of fractures. Be given hormone replacement therapy (HRT) to treat symptoms of menopause. Follow these instructions at home: Lifestyle Do not use any products that contain nicotine or tobacco, such as cigarettes, e-cigarettes, and chewing tobacco. If you need help quitting, ask your health care provider. Do not use street drugs. Do not share needles. Ask your health care provider for help if you need support or information about quitting drugs. Alcohol use Do not drink alcohol if: Your health care provider tells you not to drink. You are pregnant, may be pregnant, or are planning to become pregnant. If you drink alcohol: Limit how much you use to 0-1 drink a day. Limit intake if you are breastfeeding. Be aware of how much alcohol is in your drink. In the [place] one drink equals one 12 oz bottle of beer (355 mL), one 5 oz glass of wine (148 mL), or one 1 oz glass of hard liquor (44 mL). General instructions Schedule regular health, dental, and eye exams. Stay current with your vaccines. Tell your health care provider if: You often feel depressed. You have ever been abused or do not feel safe at home. Summary Adopting a healthy lifestyle and getting preventive care are important in promoting health and wellness. Follow your health care provider's instructions about healthy diet, exercising, and getting tested or screened for diseases. Follow your health care provider's instructions on monitoring your cholesterol and blood pressure. This information is not intended to replace advice given to you by your health care provider. Make sure you discuss any questions you have with your healthcare provider. Document Revised: 04/21/2018 Document Reviewed: 04/21/2018 Elsevier Patient Education  2022 [place]. Mechelle Pates M.D.

## 2020-11-27 LAB — LIPID PANEL
Cholesterol: 168 mg/dL (ref 0–200)
HDL: 50.2 mg/dL (ref >39.00)
LDL Cholesterol: 108 mg/dL — ABNORMAL HIGH (ref 0–99)
NonHDL: 117.96
Total CHOL/HDL Ratio: 3
Triglycerides: 49 mg/dL (ref 0.0–149.0)
VLDL: 9.8 mg/dL (ref 0.0–40.0)

## 2020-11-27 LAB — CBC WITH DIFFERENTIAL/PLATELET
Basophils Absolute: 0.1 10*3/uL (ref 0.0–0.1)
Basophils Relative: 1 % (ref 0.0–3.0)
Eosinophils Absolute: 0.1 10*3/uL (ref 0.0–0.7)
Eosinophils Relative: 1.9 % (ref 0.0–5.0)
HCT: 37.8 % (ref 36.0–46.0)
Hemoglobin: 13.1 g/dL (ref 12.0–15.0)
Lymphocytes Relative: 31 % (ref 12.0–46.0)
Lymphs Abs: 1.7 10*3/uL (ref 0.7–4.0)
MCHC: 34.6 g/dL (ref 30.0–36.0)
MCV: 85.6 fl (ref 78.0–100.0)
Monocytes Absolute: 0.3 10*3/uL (ref 0.1–1.0)
Monocytes Relative: 5.5 % (ref 3.0–12.0)
Neutro Abs: 3.3 10*3/uL (ref 1.4–7.7)
Neutrophils Relative %: 60.6 % (ref 43.0–77.0)
Platelets: 227 10*3/uL (ref 150.0–400.0)
RBC: 4.41 Mil/uL (ref 3.87–5.11)
RDW: 12.7 % (ref 11.5–15.5)
WBC: 5.4 10*3/uL (ref 4.0–10.5)

## 2020-11-27 LAB — BASIC METABOLIC PANEL
BUN: 14 mg/dL (ref 6–23)
CO2: 27 mEq/L (ref 19–32)
Calcium: 9.2 mg/dL (ref 8.4–10.5)
Chloride: 104 mEq/L (ref 96–112)
Creatinine, Ser: 0.72 mg/dL (ref 0.40–1.20)
GFR: 88.78 mL/min (ref >60.00)
Glucose, Bld: 88 mg/dL (ref 70–99)
Potassium: 4.3 mEq/L (ref 3.5–5.1)
Sodium: 137 mEq/L (ref 135–145)

## 2020-11-27 LAB — HEPATIC FUNCTION PANEL
ALT: 28 U/L (ref 0–35)
AST: 23 U/L (ref 0–37)
Albumin: 4.3 g/dL (ref 3.5–5.2)
Alkaline Phosphatase: 55 U/L (ref 39–117)
Bilirubin, Direct: 0.1 mg/dL (ref 0.0–0.3)
Total Bilirubin: 0.5 mg/dL (ref 0.2–1.2)
Total Protein: 6.7 g/dL (ref 6.0–8.3)

## 2020-11-27 LAB — TSH: TSH: 2.64 u[IU]/mL (ref 0.35–5.50)

## 2020-11-27 LAB — HEMOGLOBIN A1C: Hgb A1c MFr Bld: 5.5 % (ref 4.6–6.5)

## 2020-11-27 NOTE — Progress Notes (Signed)
Blood results are normal or favorable. Cholesterol 10-year low risk The 10-year ASCVD risk score Mikey Bussing DC Jr., et al., 2013) is: 5.7%   Values used to calculate the score:     Age: 65 years     Sex: Female     Is Non-Hispanic African American: No     Diabetic: No     Tobacco smoker: No     Systolic Blood Pressure: 250 mmHg     Is BP treated: Yes     HDL Cholesterol: 50.2 mg/dL     Total Cholesterol: 168 mg/dL

## 2021-01-01 ENCOUNTER — Other Ambulatory Visit: Payer: Self-pay

## 2021-01-01 MED ORDER — METOPROLOL SUCCINATE ER 50 MG PO TB24: ORAL_TABLET | ORAL | 1 refills | Status: DC

## 2021-01-09 ENCOUNTER — Other Ambulatory Visit: Payer: Self-pay | Admitting: Internal Medicine

## 2021-01-30 ENCOUNTER — Encounter: Payer: Self-pay | Admitting: Family Medicine

## 2021-01-30 ENCOUNTER — Telehealth: Payer: 59 | Admitting: Family Medicine

## 2021-01-30 DIAGNOSIS — B80 Enterobiasis: Secondary | ICD-10-CM | POA: Diagnosis not present

## 2021-01-30 MED ORDER — TOBRAMYCIN-DEXAMETHASONE 0.3-0.1 % OP SUSP: 2.0000 [drp] | OPHTHALMIC | 0 refills | Status: DC

## 2021-01-30 MED ORDER — MEBENDAZOLE 100 MG PO CHEW: 100.0000 mg | CHEWABLE_TABLET | Freq: Two times a day (BID) | ORAL | 0 refills | Status: DC

## 2021-01-30 NOTE — Progress Notes (Signed)
   Subjective:    Patient ID: Karen Spears, female    DOB: [birth year], 65 y.o.   MRN: [id number]  HPI Virtual Visit via Telephone Note  I connected with the patient on 01/30/21 at  3:00 PM EDT by telephone and verified that I am speaking with the correct person using two identifiers.   I discussed the limitations, risks, security and privacy concerns of performing an evaluation and management service by telephone and the availability of in person appointments. I also discussed with the patient that there may be a patient responsible charge related to this service. The patient expressed understanding and agreed to proceed.  Location patient: home Location provider: work or home office Participants present for the call: patient, provider Patient did not have a visit in the prior 7 days to address this/these issue(s).   History of Present Illness: Here asking for treatment with pinworms. She says she has had this in the past and she certain that is what the current issue is. About one week ago she began too have intense itching in the rectal area, so she took a course of Reese's Pinworm Medicine OTC. This has not helped , and now she says they have gotten into her eyes and nasal passages. Her nasal passages and eyes are irritated and they itch, and she can see tiny white worms moving in her nostrils.    Observations/Objective: Patient sounds cheerful and well on the phone. I do not appreciate any SOB. Speech and thought processing are grossly intact. Patient reported vitals:  Assessment and Plan: Possible pinworm infestation. Treat with Mebendazole tablets BID and Tobradex eye drops. Follow up as needed. Alysia Penna, MD   Follow Up Instructions:     [phone] [phone] 11-20 [phone] 21-30 I did not refer this patient for an OV in the next 24 hours for this/these issue(s).  I discussed the assessment and treatment plan with the patient. The patient was provided an opportunity to  ask questions and all were answered. The patient agreed with the plan and demonstrated an understanding of the instructions.   The patient was advised to call back or seek an in-person evaluation if the symptoms worsen or if the condition fails to improve as anticipated.  I provided 32 minutes of non-face-to-face time during this encounter.   Alysia Penna, MD     Review of Systems     Objective:   Physical Exam        Assessment & Plan:

## 2021-01-31 NOTE — Telephone Encounter (Signed)
Spoke with pt pharmacy advised per Dr Sarajane Jews to change Rx for Mebendazole, advised that since they don't  have medication in stock pt requests Rx to be transferred to another [facility] in [place], [place] state that pt should contact the [facility] and have them pull the Rx from her pharmacy. Pt is aware that her husband Rx for Mebendazole was sent to the same pharmacy

## 2021-01-31 NOTE — Telephone Encounter (Signed)
See  my other note for today

## 2021-01-31 NOTE — Telephone Encounter (Signed)
Tell her that I have reviewed the dosing recommendations for Mebendazole, and the original rx I wrote was incorrect. She should take one pill now (which she did) and then take a second pill 7 days later. So she should only get 2 pills. Please cal her pharmacy to change my prescription to say this (take one pill now and repeat in 7 days), dispense 2 pills. Since she already picked one up, I guess the new rx should be for only #1. Also I will call in a separate rx for her husband, Glendell Docker Laverna Peace) Le[facility] Como for the same medication and directions, #2 pills.

## 2021-02-08 NOTE — Telephone Encounter (Signed)
Called and spoke with patient, unable to access Monday 02/11/21, schedule,  appointment scheduled for 02/12/21 at 9:30am.   Patient stated will reach out to office at later time, to possibly inquire if available appointment on 02/11/21

## 2021-02-08 NOTE — Telephone Encounter (Signed)
Have her see me on Monday so we can figure out the next step

## 2021-02-11 ENCOUNTER — Other Ambulatory Visit: Payer: Self-pay

## 2021-02-12 ENCOUNTER — Encounter: Payer: Self-pay | Admitting: Family Medicine

## 2021-02-12 ENCOUNTER — Ambulatory Visit: Payer: 59 | Admitting: Family Medicine

## 2021-02-12 VITALS — BP 132/80 | HR 71 | Temp 98.2°F | Wt 154.1 lb

## 2021-02-12 DIAGNOSIS — B89 Unspecified parasitic disease: Secondary | ICD-10-CM | POA: Diagnosis not present

## 2021-02-12 LAB — CBC WITH DIFFERENTIAL/PLATELET
Basophils Absolute: 0 10*3/uL (ref 0.0–0.1)
Basophils Relative: 0.7 % (ref 0.0–3.0)
Eosinophils Absolute: 0.1 10*3/uL (ref 0.0–0.7)
Eosinophils Relative: 1.2 % (ref 0.0–5.0)
HCT: 41.6 % (ref 36.0–46.0)
Hemoglobin: 14.1 g/dL (ref 12.0–15.0)
Lymphocytes Relative: 24.4 % (ref 12.0–46.0)
Lymphs Abs: 1.1 10*3/uL (ref 0.7–4.0)
MCHC: 33.8 g/dL (ref 30.0–36.0)
MCV: 87.2 fl (ref 78.0–100.0)
Monocytes Absolute: 0.3 10*3/uL (ref 0.1–1.0)
Monocytes Relative: 6.7 % (ref 3.0–12.0)
Neutro Abs: 3 10*3/uL (ref 1.4–7.7)
Neutrophils Relative %: 67 % (ref 43.0–77.0)
Platelets: 249 10*3/uL (ref 150.0–400.0)
RBC: 4.77 Mil/uL (ref 3.87–5.11)
RDW: 12.7 % (ref 11.5–15.5)
WBC: 4.4 10*3/uL (ref 4.0–10.5)

## 2021-02-12 LAB — HEPATIC FUNCTION PANEL
ALT: 12 U/L (ref 0–35)
AST: 18 U/L (ref 0–37)
Albumin: 4.5 g/dL (ref 3.5–5.2)
Alkaline Phosphatase: 50 U/L (ref 39–117)
Bilirubin, Direct: 0.1 mg/dL (ref 0.0–0.3)
Total Bilirubin: 0.7 mg/dL (ref 0.2–1.2)
Total Protein: 7.1 g/dL (ref 6.0–8.3)

## 2021-02-12 LAB — BASIC METABOLIC PANEL
BUN: 21 mg/dL (ref 6–23)
CO2: 26 mEq/L (ref 19–32)
Calcium: 9.4 mg/dL (ref 8.4–10.5)
Chloride: 104 mEq/L (ref 96–112)
Creatinine, Ser: 0.76 mg/dL (ref 0.40–1.20)
GFR: 83.08 mL/min (ref >60.00)
Glucose, Bld: 104 mg/dL — ABNORMAL HIGH (ref 70–99)
Potassium: 4.6 mEq/L (ref 3.5–5.1)
Sodium: 138 mEq/L (ref 135–145)

## 2021-02-12 NOTE — Progress Notes (Addendum)
   Subjective:    Patient ID: Karen Spears, female    DOB: [birth year], 65 y.o.   MRN: [id number]  HPI Here with her husband for a very unusual set of circumstances. About 6 weeks ago she developed what she thinks is a "pinworm" infection that started in her rectum and then spread to her eyes, nose, mouth, and throat. She says she has seen tiny moving creatures in all these places, and they come out mostly at night. She brings several plastic bags today that are supposed to have numerous samples of these. I can see a few 2 mm clear round objects and one larger on that is dark and about 3 mm in diameter. She has felt itching and burning in the eyes and nose and throat. She had a virtual visit with me on 01-30-21 just before she was going on a trip ,and she was frantic that day about treating these. We did in fact give her 2 doses of Mebendazole 100 mg to take 7 days apart. She says this helped a little, but then the "worms" came back. Her husband has no such symptoms. She had a visit with Dr. Claude Manges at [facility] recently and he did not know what to think of all this. He did set up a CT sca of her sinuses and this is scheduled for tomorrow. Karen Spears is very upset and tearful, and she demands that this be cured asap.   Review of Systems  Constitutional: Negative.   Respiratory: Negative.    Cardiovascular: Negative.   Gastrointestinal: Negative.   Genitourinary: Negative.       Objective:   Physical Exam Constitutional:      Appearance: Normal appearance.  HENT:     Right Ear: Tympanic membrane, ear canal and external ear normal.     Left Ear: Tympanic membrane, ear canal and external ear normal.     Nose: Nose normal.     Mouth/Throat:     Pharynx: Oropharynx is clear.  Eyes:     Conjunctiva/sclera: Conjunctivae normal.  Cardiovascular:     Rate and Rhythm: Normal rate and regular rhythm.     Pulses: Normal pulses.     Heart sounds: Normal heart sounds.  Pulmonary:      Effort: Pulmonary effort is normal.     Breath sounds: Normal breath sounds.  Abdominal:     General: Abdomen is flat. Bowel sounds are normal. There is no distension.     Palpations: Abdomen is soft. There is no mass.     Tenderness: There is no abdominal tenderness. There is no guarding or rebound.     Hernia: No hernia is present.  Lymphadenopathy:     Cervical: No cervical adenopathy.  Skin:    Findings: No erythema or rash.  Neurological:     Mental Status: She is alert.          Assessment & Plan:  She complains of some sort of parasite that has been residing in her body for the past 6 weeks. We will refer her urgently to Infectious Disease to try to sort this out. We will also get labs today to include a stool ova and parasite study. We spent a total of (32   ) minutes reviewing records and discussing these issues.  Alysia Penna, MD

## 2021-02-13 NOTE — Telephone Encounter (Signed)
Noted. I made the correction to my office note

## 2021-02-14 ENCOUNTER — Other Ambulatory Visit: Payer: Self-pay | Admitting: Internal Medicine

## 2021-02-14 NOTE — Telephone Encounter (Signed)
Noted  

## 2021-02-19 ENCOUNTER — Encounter: Payer: Self-pay | Admitting: Internal Medicine

## 2021-02-19 ENCOUNTER — Encounter: Payer: Self-pay | Admitting: Family Medicine

## 2021-02-19 ENCOUNTER — Ambulatory Visit (INDEPENDENT_AMBULATORY_CARE_PROVIDER_SITE_OTHER): Payer: 59 | Admitting: Internal Medicine

## 2021-02-19 ENCOUNTER — Other Ambulatory Visit: Payer: Self-pay

## 2021-02-19 DIAGNOSIS — L290 Pruritus ani: Secondary | ICD-10-CM | POA: Diagnosis not present

## 2021-02-19 LAB — OVA AND PARASITE EXAMINATION
CONCENTRATE RESULT:: NONE SEEN
MICRO NUMBER:: 12464460
SPECIMEN QUALITY:: ADEQUATE
TRICHROME RESULT:: NONE SEEN

## 2021-02-19 NOTE — Progress Notes (Signed)
[facility] for Infectious Disease      Reason for Consult: concern for parasitic infection   Referring Physician: Dr. Sarajane Jews    Patient ID: Karen Spears, female    DOB: 02/02/1956, 65 y.o.   MRN: [id number]  HPI:   Here for evaluation for concern of feeling parasites perirectally and in her nose, eyes and throat.  She feels about 7-8 weeks ago developed a pinworm infection that started in her rectal area then noted something in her eyes, nose, mouth and throat.  She states she can see them.  Her husband is also at the appointment and confirms he sees them as well. No history of this before.  Sees them in her stool.  Sees them in her nose.  She brought in samples of dried swabs of what she tells me are parasites/worms.     Past Medical History:  Diagnosis Date   Allergic rhinitis    Allergy    Anxiety    Arthritis    hips   Asthmatic bronchitis 07/30/2012   Bladder polyps    and stones dr Amalia Hailey    Bronchospasm 08/20/2012   Much improved on steroid inhaler and removal from old the house. At this time hold on specialty referral consider PFTs in the future maintain on Symbicort for n    Chronic hip pain    bursitis and arthritis   Drug rash 05/13/2011   ? Fixed rash  From diflucan  ?    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 Authorizing Provider  albuterol (VENTOLIN HFA) 108 (90 Base) MCG/ACT inhaler Inhale 1-2 puffs into the lungs every 6 (six) hours as needed for wheezing or shortness of  breath. 11/17/18   Panosh, Standley Brooking, MD  ALPRAZolam Duanne Moron) 0.25 MG tablet TAKE 1 AND 1/2 TABLETS BY MOUTH EVERY NIGHT AT BEDTIME AS NEEDED 11/26/20   Panosh, Standley Brooking, MD  estradiol (VIVELLE-DOT) 0.05 MG/24HR patch APPLY 1 PATCH(0.05 MG) TOPICALLY TO THE SKIN 2 TIMES A WEEK 01/10/21   Panosh, Standley Brooking, MD  ibuprofen (ADVIL) 800 MG tablet TAKE 1 TABLET(800 MG) BY MOUTH TWICE DAILY AS NEEDED FOR PAIN 09/19/20   Panosh, Standley Brooking, MD  ketoprofen (ORUDIS) 50 MG capsule Take 1 capsule (50 mg total) by mouth 4 (four) times daily as needed. 05/08/17   Panosh, Standley Brooking, MD  lidocaine (LIDODERM) 5 % Place 1 patch onto the skin as directed. Remove & Discard patch within 12 hours or as directed by MD 11/26/20   Panosh, Standley Brooking, MD  losartan (COZAAR) 25 MG tablet TAKE 1 TABLET(25 MG) BY MOUTH DAILY 02/14/21   Panosh, Standley Brooking, MD  mebendazole (VERMOX) 100 MG chewable tablet Chew 1 tablet (100 mg total) by mouth 2 (two) times daily. 01/30/21   Laurey Morale, MD  metoprolol succinate (TOPROL-XL) 50 MG 24 hr tablet Take with or immediately following a meal.  01/01/21   Panosh, Standley Brooking, MD  promethazine (PHENERGAN) 25 MG suppository Place 1 suppository (25 mg total) rectally every 6 (six) hours as needed for up to 7 days for nausea. 11/26/20 12/03/20  Panosh, Standley Brooking, MD  rizatriptan (MAXALT) 10 MG tablet Take 1 tablet (10 mg total) by mouth as needed. May repeat in 2 hours if needed 05/08/17   Panosh, Standley Brooking, MD  terconazole (TERAZOL 3) 0.8 % vaginal cream Place 1 applicator vaginally at bedtime. 03/15/20   Panosh, Standley Brooking, MD  tobramycin-dexamethasone [facility]) ophthalmic solution Place 2 drops into both eyes every 4 (four) hours while awake. 01/30/21   Laurey Morale, MD  triamcinolone cream (KENALOG) 0.1 % Apply 1 application topically 2 (two) times daily. 01/11/20   Panosh, Standley Brooking, MD  valACYclovir (VALTREX) 1000 MG tablet Take 1 tablet 3 times daily for 7 days then take 1 daily 01/11/20   Panosh, Standley Brooking, MD    Allergies   Allergen Reactions   Azithromycin Other (See Comments)   Cefdinir Other (See Comments)    REACTION: ? if headache  see Nov 9th Visit REACTION: ? if headache  see Nov 9th Visit   Fexofenadine Other (See Comments)   Fluconazole Other (See Comments)    REACTION: Raw spot under nose and chin REACTION: Raw spot under nose and chin   Penicillins Swelling   Terbinafine And Related Other (See Comments)    Burning  Nose  Raw      Social History   Tobacco Use   Smoking status: Never   Smokeless tobacco: Never  Vaping Use   Vaping Use: Never used  Substance Use Topics   Alcohol use: No    Alcohol/week: 0.0 standard drinks   Drug use: No    Family History  Problem Relation Age of Onset   Gallstones Mother    Colon polyps Mother    Gallstones Sister    Bipolar disorder Other        child   Colon polyps Father    Colon cancer Neg Hx    Esophageal cancer Neg Hx    Rectal cancer Neg Hx    Stomach cancer Neg Hx     Review of Systems  Constitutional: negative for fevers, chills, and fatigue Gastrointestinal: negative for nausea and diarrhea Integument/breast: negative for rash All other systems reviewed and are negative    Constitutional: in no apparent distress There were no vitals filed for this visit. EYES: anicteric ENMT: no thrush Cardiovascular: Cor RRR Respiratory: normal respiratory effort GI: soft Skin: no rash Neuro: non-focal  Labs: Lab Results  Component Value Date   WBC 4.4 02/12/2021   HGB 14.1 02/12/2021   HCT 41.6 02/12/2021   MCV 87.2 02/12/2021   PLT 249.0 02/12/2021    Lab Results  Component Value Date   CREATININE 0.76 02/12/2021   BUN 21 02/12/2021   NA 138 02/12/2021   K 4.6 02/12/2021   CL 104 02/12/2021   CO2 26 02/12/2021    Lab Results  Component Value Date   ALT 12 02/12/2021   AST 18 02/12/2021   ALKPHOS 50 02/12/2021   BILITOT 0.7 02/12/2021     Assessment: I discussed with her the likelihood she has multiple parasites in  multiple sites being unlikely.  She did show a picture from her husbands phone of what she felt was a parasite though no parasite noted on my review.  She reported it was transparent.  I discussed with her  that I do not see parasites and do not think she has any but certainly can test her stool, sputum and nasal swab for parasites for confirmation.  She is interested in testing and states she will 'become famous' due to the unusual nature of her ailment.    Plan: 1)  O and P x2 more 2) O and P of sputume 3) pathology smear of nasal swab I let her know that if no parasites are found, no treatment indicated.

## 2021-02-20 ENCOUNTER — Encounter: Payer: Self-pay | Admitting: Internal Medicine

## 2021-02-20 ENCOUNTER — Ambulatory Visit: Payer: 59 | Admitting: Infectious Disease

## 2021-02-20 ENCOUNTER — Other Ambulatory Visit: Payer: Self-pay

## 2021-02-20 ENCOUNTER — Other Ambulatory Visit: Payer: 59

## 2021-02-20 DIAGNOSIS — L290 Pruritus ani: Secondary | ICD-10-CM

## 2021-02-21 ENCOUNTER — Telehealth: Payer: Self-pay

## 2021-02-21 NOTE — Telephone Encounter (Signed)
Patient called stating that she would like a talk to Dr Sarajane Jews about 9 week on going issue as soon as possible I informed patient Dr Sarajane Jews is out of the office and that someone will contact her tomorrow.

## 2021-02-21 NOTE — Telephone Encounter (Signed)
Please advise 

## 2021-02-22 NOTE — Telephone Encounter (Signed)
She can leave a message for me. She is already seeing Infectious Disease, so they are now taking care of the problem

## 2021-02-23 LAB — OVA AND PARASITE EXAMINATION
CONCENTRATE RESULT:: NONE SEEN
MICRO NUMBER:: 12494202
SPECIMEN QUALITY:: ADEQUATE
TRICHROME RESULT:: NONE SEEN

## 2021-02-25 ENCOUNTER — Other Ambulatory Visit: Payer: 59

## 2021-02-25 ENCOUNTER — Other Ambulatory Visit: Payer: Self-pay

## 2021-02-25 DIAGNOSIS — L290 Pruritus ani: Secondary | ICD-10-CM

## 2021-02-25 NOTE — Addendum Note (Signed)
Addended by: Caffie Pinto on: 02/25/2021 02:54 PM   Modules accepted: Orders

## 2021-02-26 ENCOUNTER — Encounter: Payer: Self-pay | Admitting: Internal Medicine

## 2021-02-26 ENCOUNTER — Ambulatory Visit: Payer: 59 | Admitting: Internal Medicine

## 2021-02-26 ENCOUNTER — Other Ambulatory Visit: Payer: Self-pay

## 2021-02-26 VITALS — BP 160/80 | HR 70 | Temp 98.5°F | Ht 64.0 in | Wt 157.0 lb

## 2021-02-26 DIAGNOSIS — R109 Unspecified abdominal pain: Secondary | ICD-10-CM

## 2021-02-26 DIAGNOSIS — L290 Pruritus ani: Secondary | ICD-10-CM

## 2021-02-26 DIAGNOSIS — F411 Generalized anxiety disorder: Secondary | ICD-10-CM

## 2021-02-26 LAB — OVA AND PARASITE EXAMINATION
CONCENTRATE RESULT:: NONE SEEN
MICRO NUMBER:: 12498458
SPECIMEN QUALITY:: ADEQUATE

## 2021-02-26 MED ORDER — MEBENDAZOLE 100 MG PO CHEW: 100.0000 mg | CHEWABLE_TABLET | Freq: Two times a day (BID) | ORAL | 0 refills | Status: DC

## 2021-02-26 NOTE — Progress Notes (Signed)
Chief Complaint  Patient presents with   Follow-up    HPI: Karen Spears 65 y.o. come in for  sda acute appt   with husband  concern about parasites.  Intestinal worms . Symptoms below   August   had large stinging insect though the pants   left  thigh . Animal exposures.  In rain  exposed   September 18   felt crawling nose and  rectal. Area itching and other sx   Took otc pinworm rx.  Emp[facility] Ent and  ID. Eval  no dx   some labs pending  Not    White threads out of nose. Are continuing but using now nasal washes   and some improved  Rectal itching   irritation at night  Blood dripping  out .vag vx rectal after "stinging"  episode  concern about Tape worms.   Abd pain. Initially upper and now all over worse after eating and luq  . One episode of diarrhea  no cough but throat clearing    Saw dr Sarajane Jews x 2 and ID  on 10 11  felt  time with id too llittle and samples they brought were not looked at . No new meds  taking whole alprrax at night since this started fel secondary anxiety    ROS: See pertinent positives and negatives per HPI. Husband has no sx  Past Medical History:  Diagnosis Date   Allergic rhinitis    Allergy    Anxiety    Arthritis    hips   Asthmatic bronchitis 07/30/2012   Bladder polyps    and stones dr Amalia Hailey    Bronchospasm 08/20/2012   Much improved on steroid inhaler and removal from old the house. At this time hold on specialty referral consider PFTs in the future maintain on Symbicort for n    Chronic hip pain    bursitis and arthritis   Drug rash 05/13/2011   ? Fixed rash  From diflucan  ?    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, pelvic floor    Reaction to severe stress 12/31/2011    Family History  Problem Relation Age of Onset   Gallstones Mother    Colon polyps Mother    Gallstones Sister    Bipolar disorder Other        child   Colon polyps Father    Colon cancer Neg Hx    Esophageal cancer Neg Hx    Rectal cancer Neg Hx    Stomach cancer Neg Hx     Social History   Socioeconomic History   Marital status: Married    Spouse name: Not on file   Number of children: Not on file   Years of education: Not on file   Highest education level: Not on file  Occupational History   Not on file  Tobacco Use   Smoking status: Never   Smokeless tobacco: Never  Vaping Use   Vaping Use: Never used  Substance and Sexual Activity   Alcohol use: No  Alcohol/week: 0.0 standard drinks   Drug use: No   Sexual activity: Not on file  Other Topics Concern   Not on file  Social History Narrative   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    Has parish church    Married   Regular exercise- no some   Has grandchildren   Hs farm Gaffer building house   A child is bipolar         Social Determinants of Radio broadcast assistant Strain: Not on Art therapist Insecurity: Not on file  Transportation Needs: Not on file  Physical Activity: Not on file  Stress: Not on file  Social Connections: Not on file    Outpatient Medications Prior to Visit  Medication Sig Dispense Refill   albuterol (VENTOLIN HFA) 108 (90 Base) MCG/ACT inhaler Inhale 1-2 puffs into the lungs every 6 (six) hours as needed for wheezing or shortness of breath. 18 g 3   ALPRAZolam (XANAX) 0.25 MG tablet TAKE 1 AND 1/2 TABLETS BY MOUTH EVERY NIGHT AT BEDTIME AS NEEDED 45 tablet 1   estradiol (VIVELLE-DOT) 0.05 MG/24HR patch APPLY 1 PATCH(0.05 MG) TOPICALLY TO THE SKIN 2 TIMES A WEEK 24 patch 1   ibuprofen (ADVIL) 800 MG tablet TAKE 1 TABLET(800 MG) BY MOUTH  TWICE DAILY AS NEEDED FOR PAIN 60 tablet 3   ketoprofen (ORUDIS) 50 MG capsule Take 1 capsule (50 mg total) by mouth 4 (four) times daily as needed. 30 capsule 0   lidocaine (LIDODERM) 5 % Place 1 patch onto the skin as directed. Remove & Discard patch within 12 hours or as directed by MD 30 patch 1   losartan (COZAAR) 25 MG tablet TAKE 1 TABLET(25 MG) BY MOUTH DAILY 90 tablet 0   mebendazole (VERMOX) 100 MG chewable tablet Chew 1 tablet (100 mg total) by mouth 2 (two) times daily. 60 tablet 0   metoprolol succinate (TOPROL-XL) 50 MG 24 hr tablet Take with or immediately following a meal. 90 tablet 1   rizatriptan (MAXALT) 10 MG tablet Take 1 tablet (10 mg total) by mouth as needed. May repeat in 2 hours if needed 10 tablet 0   terconazole (TERAZOL 3) 0.8 % vaginal cream Place 1 applicator vaginally at bedtime. 20 g 1   tobramycin-dexamethasone (TOBRADEX) ophthalmic solution Place 2 drops into both eyes every 4 (four) hours while awake. 10 mL 0   triamcinolone cream (KENALOG) 0.1 % Apply 1 application topically 2 (two) times daily. 30 g 2   valACYclovir (VALTREX) 1000 MG tablet Take 1 tablet 3 times daily for 7 days then take 1 daily 60 tablet 3   promethazine (PHENERGAN) 25 MG suppository Place 1 suppository (25 mg total) rectally every 6 (six) hours as needed for up to 7 days for nausea. 6 each 1   No facility-administered medications prior to visit.     EXAM:  BP (!) 160/80 (BP Location: Left Arm, Patient Position: Sitting, Cuff Size: Normal)   Pulse 70   Temp 98.5 F (36.9 C) (Oral)   Ht 5\' 4"  (1.626 m)   Wt 157 lb (71.2 kg)   SpO2 96%   BMI 26.95 kg/m   Body mass index is 26.95 kg/m.  GENERAL: vitals reviewed and listed above, alert, oriented, appears well hydrated and in no acute distress HEENT: atraumatic, conjunctiva  clear, no obvious abnormalities on inspection of external nose and ears OP : no lesion edema or exudate  NECK: no obvious masses on  inspection palpation   LUNGS: clear to auscultation bilaterally, no wheezes, rales or rhonchi, good air movement CV: HRRR, no clubbing cyanosis or  peripheral edema nl cap refill  MS: moves all extremities without noticeable focal  abnormality PSYCH: pleasant and cooperative, no obvious depression or anxiety Lab Results  Component Value Date   WBC 4.4 02/12/2021   HGB 14.1 02/12/2021   HCT 41.6 02/12/2021   PLT 249.0 02/12/2021   GLUCOSE 104 (H) 02/12/2021   CHOL 168 11/26/2020   TRIG 49.0 11/26/2020   HDL 50.20 11/26/2020   LDLCALC 108 (H) 11/26/2020   ALT 12 02/12/2021   AST 18 02/12/2021   NA 138 02/12/2021   K 4.6 02/12/2021   CL 104 02/12/2021   CREATININE 0.76 02/12/2021   BUN 21 02/12/2021   CO2 26 02/12/2021   TSH 2.64 11/26/2020   HGBA1C 5.5 11/26/2020   BP Readings from Last 3 Encounters:  02/26/21 (!) 160/80  02/19/21 (!) 159/77  02/12/21 132/80  No results found for: [id number]   ASSESSMENT AND PLAN:  Discussed the following assessment and plan:  Abdominal pain, unspecified abdominal location - Plan: US Abdomen Complete  Rectal itching - concern about  parasites  see disc  Anxiety state Uncertain cause   husband verifies  structured  stings  of worm and avoiding.  Do not know dx but  empiric  rx for round worm type .  If not helpful then more worlk up.  Abd [place] . If any bleeding plan  more fu  Patient is aware  diff dx and  dx of parasitics psychosis  and husband feels not a psych problem s her has seen problem  Had partial rx if round worm  ok for pin worms   risk benefit  rx vermox 100 bid for 3 days   printed   Awaitng test results  . ? Other causes of her sx .  Esp if gets vag rectal bleeding -Patient advised to return or notify health care team  if  new concerns arise. rBp up today not adressed could be from stress.   35 minutes review discussion plan of visit  There are no Patient Instructions on file for this visit.   Standley Brooking. Kristain Hu M.D.

## 2021-02-27 ENCOUNTER — Telehealth: Payer: Self-pay | Admitting: Internal Medicine

## 2021-02-27 LAB — OVA AND PARASITE EXAMINATION

## 2021-02-27 NOTE — Telephone Encounter (Signed)
Patient called because pharmacy states they need a call to fill prescription and they have sent something to office stating so. Patient request a call back to explain further details    Good callback number is [phone]   Please Advise

## 2021-02-27 NOTE — Telephone Encounter (Signed)
Pt had an office visit with pt on 02/26/2021

## 2021-02-27 NOTE — Telephone Encounter (Signed)
Pt  call and stated the name of the medication is Emvem 6 pill 100 mg each pt stated it is the same as Mebendazloe .

## 2021-02-27 NOTE — Telephone Encounter (Signed)
Pt call back and stated she want dr.Panosh to know that she is upset because she about not getting her New Medication the name of it is Emvem 100 mg per pill.

## 2021-02-28 ENCOUNTER — Other Ambulatory Visit: Payer: Self-pay | Admitting: Internal Medicine

## 2021-02-28 LAB — OVA AND PARASITE EXAMINATION
CONCENTRATE RESULT:: NONE SEEN
MICRO NUMBER:: 12512275
SPECIMEN QUALITY:: ADEQUATE
TRICHROME RESULT:: NONE SEEN

## 2021-02-28 NOTE — Telephone Encounter (Signed)
PA forms have been faxed to the pt's insurance and Mychart message has been sent informing pt of this.

## 2021-02-28 NOTE — Telephone Encounter (Signed)
PA forms have been faxed to the pt's insurance for approval. Mychart message has been sent informing pt of this.

## 2021-03-04 ENCOUNTER — Telehealth: Payer: Self-pay

## 2021-03-04 MED ORDER — ALBENDAZOLE 200 MG PO TABS: 400.0000 mg | ORAL_TABLET | Freq: Once | ORAL | 0 refills | Status: DC

## 2021-03-04 NOTE — Telephone Encounter (Signed)
-----   Message from Thayer Headings, MD sent at 03/04/2021  2:44 PM EDT ----- Please let her know that all tests are negative for parasites so she has no active parasites in her system and she does not need any treatment.  No follow up needed.   Thanks!

## 2021-03-04 NOTE — Telephone Encounter (Signed)
RN advised patient that her lab work did not indicate presence of any parasites. Patient states that her "other doctor" disagreed and has ordered additional testing (looks like [place] was indicated and medication) for tapeworm. Patient is seeing Dr. Regis Bill; RN stated that Dr. Linus Salmons did not see anything to indicate treatment however she is welcome to continue follow up with Dr. Regis Bill if she would like as there is no additional need for ID visits. Patient verbalized understanding.   Kaula Klenke Lorita Officer, RN

## 2021-03-04 NOTE — Telephone Encounter (Signed)
Mebendazole has been denied by pt insurance due to failure of trial of Albendazole. I spoke with the pt and she stated that she spoke with her insurance and they stated that they would deny any medications that was sent in for current dx. Pt stated that a pre-approval from would need to be completed prior to sending in medication for expedited review by insurance. Pt informed me that she spoke with insurance regarding Albendazole and they stated they would not cover medication if prescribed. Pt stated she is willing to pay out of pocket for medication and would like to get PCP'S opinion on how to proceed. Please advise

## 2021-03-04 NOTE — Telephone Encounter (Signed)
So diagnosis should be  parasitic infection  round worm suspected and not abd pain   I sent in 200 mg take 2 x 1 for  worm dx    Please contact pharmacy and see if will go through and let patient know

## 2021-03-05 MED ORDER — ALBENDAZOLE 200 MG PO TABS: 400.0000 mg | ORAL_TABLET | Freq: Once | ORAL | 0 refills | Status: AC

## 2021-03-05 NOTE — Telephone Encounter (Signed)
I spoke with the pt and informed her that her rx has been sent to her pharmacy. Pt stated that she would purchase the medication whether her insurance approves or denies.

## 2021-03-06 ENCOUNTER — Ambulatory Visit
Admission: RE | Admit: 2021-03-06 | Discharge: 2021-03-06 | Disposition: A | Discharge status: Home / Self Care | Payer: 59 | Source: Ambulatory Visit | Attending: Internal Medicine | Admitting: Internal Medicine

## 2021-03-06 DIAGNOSIS — R109 Unspecified abdominal pain: Secondary | ICD-10-CM

## 2021-03-07 ENCOUNTER — Telehealth: Payer: 59 | Admitting: Internal Medicine

## 2021-03-07 NOTE — Telephone Encounter (Signed)
The radiologist's reading is not available yet. I should be able to see this first thing in the morning

## 2021-03-10 NOTE — Progress Notes (Signed)
No significant finding on abdominal ultrasound    I see that Ct scan in Ed is also  negative .  This is all reassuring and less likely to have  infestation as cause of sx.  We should have you see the Gi team  also    Please get  her appt with dr Garth Schlatter team.  If agrees

## 2021-03-11 ENCOUNTER — Other Ambulatory Visit: Payer: Self-pay

## 2021-03-11 DIAGNOSIS — R109 Unspecified abdominal pain: Secondary | ICD-10-CM

## 2021-03-12 ENCOUNTER — Encounter: Payer: Self-pay | Admitting: Internal Medicine

## 2021-03-12 ENCOUNTER — Ambulatory Visit: Payer: 59 | Admitting: Internal Medicine

## 2021-03-12 ENCOUNTER — Other Ambulatory Visit: Payer: Self-pay

## 2021-03-12 VITALS — BP 150/80 | HR 63 | Temp 97.8°F | Ht 64.0 in | Wt 158.4 lb

## 2021-03-12 DIAGNOSIS — R109 Unspecified abdominal pain: Secondary | ICD-10-CM

## 2021-03-12 DIAGNOSIS — R6889 Other general symptoms and signs: Secondary | ICD-10-CM | POA: Diagnosis not present

## 2021-03-12 DIAGNOSIS — J3489 Other specified disorders of nose and nasal sinuses: Secondary | ICD-10-CM | POA: Diagnosis not present

## 2021-03-12 DIAGNOSIS — R198 Other specified symptoms and signs involving the digestive system and abdomen: Secondary | ICD-10-CM

## 2021-03-12 DIAGNOSIS — Z974 Presence of external hearing-aid: Secondary | ICD-10-CM | POA: Diagnosis not present

## 2021-03-12 MED ORDER — DOXYCYCLINE HYCLATE 100 MG PO TABS: 100.0000 mg | ORAL_TABLET | Freq: Two times a day (BID) | ORAL | 0 refills | Status: DC

## 2021-03-12 NOTE — Progress Notes (Signed)
Chief Complaint  Patient presents with   Follow-up   Sinus Problem    HPI: Karen Spears 64 y.o. come in for fu   meds  and abd pain  concern about  parasite worms  now  gone    Ed visit   when abd pain got so much worse ct was normal  at Ed visit  Has been having  ongoing sx  feeling like  Something in throat .   Ocass  cough gagging.  No more active  parasitic worms  problem   Choking and  strangling feeling     and now sinus pain front face and teeth and worse when laying down.  Past hx of  polyps nose  and  in past  also remote  throat    nodules  removed ( Dr Ernesto Rutherford)   ROS: See pertinent positives and negatives per HPI. No fever   Past Medical History:  Diagnosis Date   Allergic rhinitis    Allergy    Anxiety    Arthritis    hips   Asthmatic bronchitis 07/30/2012   Bladder polyps    and stones dr Amalia Hailey    Bronchospasm 08/20/2012   Much improved on steroid inhaler and removal from old the house. At this time hold on specialty referral consider PFTs in the future maintain on Symbicort for n    Chronic hip pain    bursitis and arthritis   Drug rash 05/13/2011   ? Fixed rash  From diflucan  ?    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 saw Dr Brien [facility] in the past   GERD (gastroesophageal reflux disease)    prilosec prn only   Headache(784.0)    Hypertension    MIGRAINE HEADACHE 03/29/2009   Qualifier: Diagnosis of  By: Regis Bill MD, Standley Brooking    Migraines    Nerve damage    pelvic floor   Neuromuscular disorder (HCC)    hips, pelvic floor    Reaction to severe stress 12/31/2011    Family History  Problem Relation Age of Onset   Gallstones Mother    Colon polyps Mother    Gallstones Sister    Bipolar disorder Other        child   Colon polyps Father    Colon cancer Neg Hx    Esophageal cancer Neg Hx    Rectal cancer Neg Hx    Stomach cancer Neg Hx     Social History   Socioeconomic History    Marital status: Married    Spouse name: Not on file   Number of children: Not on file   Years of education: Not on file   Highest education level: Not on file  Occupational History   Not on file  Tobacco Use   Smoking status: Never   Smokeless tobacco: Never  Vaping Use   Vaping Use: Never used  Substance and Sexual Activity   Alcohol use: No    Alcohol/week: 0.0 standard drinks   Drug use: No   Sexual activity: Not on file  Other Topics Concern   Not on file  Social History Narrative   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    Has [employer]    Married   Regular exercise- no some   Has grandchildren   Hs farm property [place] house   A child is bipolar  Social Determinants of Health   Financial Resource Strain: Not on file  Food Insecurity: Not on file  Transportation Needs: Not on file  Physical Activity: Not on file  Stress: Not on file  Social Connections: Not on file    Outpatient Medications Prior to Visit  Medication Sig Dispense Refill   albuterol (VENTOLIN HFA) 108 (90 Base) MCG/ACT inhaler Inhale 1-2 puffs into the lungs every 6 (six) hours as needed for wheezing or shortness of breath. 18 g 3   ALPRAZolam (XANAX) 0.25 MG tablet TAKE 1 AND 1/2 TABLETS BY MOUTH EVERY NIGHT AT BEDTIME AS NEEDED 45 tablet 0   estradiol (VIVELLE-DOT) 0.05 MG/24HR patch APPLY 1 PATCH(0.05 MG) TOPICALLY TO THE SKIN 2 TIMES A WEEK 24 patch 1   ibuprofen (ADVIL) 800 MG tablet TAKE 1 TABLET(800 MG) BY MOUTH TWICE DAILY AS NEEDED FOR PAIN 60 tablet 3   ketoprofen (ORUDIS) 50 MG capsule Take 1 capsule (50 mg total) by mouth 4 (four) times daily as needed. 30 capsule 0   lidocaine (LIDODERM) 5 % Place 1 patch onto the skin as directed. Remove & Discard patch within 12 hours or as directed by MD 30 patch 1   losartan (COZAAR) 25 MG tablet TAKE 1 TABLET(25 MG) BY MOUTH DAILY 90 tablet 0   mebendazole (VERMOX) 100 MG chewable tablet  Chew 1 tablet (100 mg total) by mouth 2 (two) times daily. 60 tablet 0   mebendazole (VERMOX) 100 MG chewable tablet Chew 1 tablet (100 mg total) by mouth 2 (two) times daily. 6 tablet 0   metoprolol succinate (TOPROL-XL) 50 MG 24 hr tablet Take with or immediately following a meal. 90 tablet 1   rizatriptan (MAXALT) 10 MG tablet Take 1 tablet (10 mg total) by mouth as needed. May repeat in 2 hours if needed 10 tablet 0   terconazole (TERAZOL 3) 0.8 % vaginal cream Place 1 applicator vaginally at bedtime. 20 g 1   tobramycin-dexamethasone (TOBRADEX) ophthalmic solution Place 2 drops into both eyes every 4 (four) hours while awake. 10 mL 0   triamcinolone cream (KENALOG) 0.1 % Apply 1 application topically 2 (two) times daily. 30 g 2   valACYclovir (VALTREX) 1000 MG tablet Take 1 tablet 3 times daily for 7 days then take 1 daily 60 tablet 3   promethazine (PHENERGAN) 25 MG suppository Place 1 suppository (25 mg total) rectally every 6 (six) hours as needed for up to 7 days for nausea. 6 each 1   No facility-administered medications prior to visit.   Ed review   EXAM:  BP (!) 150/80 (BP Location: Left Arm, Patient Position: Sitting, Cuff Size: Normal)   Pulse 63   Temp 97.8 F (36.6 C) (Oral)   Ht 5\' 4"  (1.626 m)   Wt 158 lb 6.4 oz (71.8 kg)   SpO2 97%   BMI 27.19 kg/m   Body mass index is 27.19 kg/m.  GENERAL: vitals reviewed and listed above, alert, oriented, appears well hydrated and in no acute distress HEENT: atraumatic, conjunctiva  clear, no obvious abnormalities on inspection of external nose and ears OP masked face mildly tender  no edema  voice seems steady   NECK: no obvious masses on inspection palpation  LUNGS: nl respiration  CV: HRRR, no clubbing cyanosis or  peripheral edema nl cap refill  MS: moves all extremities without noticeable focal  abnormality PSYCH: pleasant and cooperative,   BP Readings from Last 3 Encounters:  03/12/21 (!) 150/80  02/26/21 (!) 160/80  02/19/21 (!) 159/77    ASSESSMENT AND PLAN:  Discussed the following assessment and plan:  Throat symptom - feels like something in throat ? atypical reflux ? other obs sx hx of laryngeal polyos no voice change - Plan: Ambulatory referral to ENT  Sinus pain - hx of sinusisit  and polyps has se of antibiotic  not typical of  bact sinus ?   other causes - Plan: Ambulatory referral to ENT  Episode of gagging - Plan: Ambulatory referral to ENT  Hearing aid worn  Abdominal pain, unspecified abdominal location - improved  ct scan was basically normal  Testing has been negative  and abd [place] nl  advised referral for gi consult .  Needs  Thorough ENT exam  sinus and throat .  Plan GI appt/ to address the upper throat sx and abd pain ? If endoscopy would be helpful ?  Can try ppi omeprazole   in interim.  Given printed rx for doxy with   caution and to use only if we have more evidence of bacterial  sinus infection.   Do not take  at this time.  -Patient advised to return or notify health care team  if  new concerns arise.  Patient Instructions  I will do a referral for ent exam to help define your sinus face pain and feeling of throat sx . See Gi for evaluation   of sx abd pain and upper  throat sx .   If needed we can add antibiotic  in interim but would prefer not taking  yet  and med could cause  gi s if not swallowed completely .    Standley Brooking. Neftali Abair M.D.

## 2021-03-12 NOTE — Patient Instructions (Signed)
I will do a referral for ent exam to help define your sinus face pain and feeling of throat sx . See Gi for evaluation   of sx abd pain and upper  throat sx .   If needed we can add antibiotic  in interim but would prefer not taking  yet  and med could cause  gi s if not swallowed completely .

## 2021-03-13 NOTE — Telephone Encounter (Addendum)
I spoke with a representative at [facility] and she stated that the pt could be seen earlier than her appt time with a PA. I spoke with the Pt and she declined visit with PA as she stated she wanted to be seen by an actual MD.

## 2021-03-14 ENCOUNTER — Encounter: Payer: Self-pay | Admitting: Gastroenterology

## 2021-03-20 MED ORDER — POLYMYXIN B-TRIMETHOPRIM 10000-0.1 UNIT/ML-% OP SOLN: 1.0000 [drp] | Freq: Four times a day (QID) | OPHTHALMIC | 0 refills | Status: DC

## 2021-03-20 NOTE — Addendum Note (Signed)
Addended byShanon Ace K on: 03/20/2021 01:46 PM   Modules accepted: Orders

## 2021-03-20 NOTE — Telephone Encounter (Signed)
So the eye drops had an antibiotic and a steroid  . I dont advise  further steroid in the eyes without a specific indication.  As damage can happen   in certain  situations. I can sen in antibiotic eye driop. But that is for bacterial infection  You should see eye doctors for any symptoms   on going  not sure what the spots mean .Marland Kitchen

## 2021-03-25 NOTE — Telephone Encounter (Signed)
Need more information What is she seeing ?  Can give her a regular specimen cup  for stool collection but that is not for processing   Can take a picture and attack to my chart  if possible

## 2021-03-26 NOTE — Telephone Encounter (Signed)
I spoke with the pt and informed her to drop off stool sample at the request of PCP.

## 2021-03-28 NOTE — Telephone Encounter (Signed)
Good news looked at your stool did not see anything significantly abnormal   Tested negative for blood.  Also looked at the small clear string that was in the cup.  Under the microscope.  Does not look like a worm or parasite to my eye (  not an expert ) not sure if it is dried mucus or  other foreign substance   But does not look like a fiber.  I saw that your ear nose and throat exam was normal and reassuring. I still want you to follow-up have an appointment with the GI team because of the history of abdominal pain i and at times you have had difficulty swallowing.  I think all of this will pass as you will recover .     give it time and there is no evidence of alarming infection  disease.

## 2021-04-03 ENCOUNTER — Encounter: Payer: Self-pay | Admitting: Gastroenterology

## 2021-04-03 ENCOUNTER — Ambulatory Visit (INDEPENDENT_AMBULATORY_CARE_PROVIDER_SITE_OTHER): Payer: 59 | Admitting: Gastroenterology

## 2021-04-03 VITALS — BP 150/86 | HR 69 | Ht 64.0 in | Wt 156.6 lb

## 2021-04-03 DIAGNOSIS — R1013 Epigastric pain: Secondary | ICD-10-CM

## 2021-04-03 DIAGNOSIS — R1084 Generalized abdominal pain: Secondary | ICD-10-CM

## 2021-04-03 DIAGNOSIS — Z791 Long term (current) use of non-steroidal anti-inflammatories (NSAID): Secondary | ICD-10-CM | POA: Diagnosis not present

## 2021-04-03 NOTE — Patient Instructions (Signed)
You have been scheduled for an endoscopy and colonoscopy. Please follow the written instructions given to you at your visit today. Please pick up your prep supplies at the pharmacy within the next 1-3 days. If you use inhalers (even only as needed), please bring them with you on the day of your procedure.  If you are age 64 or older, your body mass index should be between 23-30. Your Body mass index is 26.88 kg/m. If this is out of the aforementioned range listed, please consider follow up with your Primary Care Provider.  If you are age 93 or younger, your body mass index should be between 19-25. Your Body mass index is 26.88 kg/m. If this is out of the aformentioned range listed, please consider follow up with your Primary Care Provider.   ________________________________________________________  The [facility] providers would like to encourage you to use [facility] to communicate with providers for non-urgent requests or questions.  Due to long hold times on the telephone, sending your provider a message by [facility] may be a faster and more efficient way to get a response.  Please allow 48 business hours for a response.  Please remember that this is for non-urgent requests.  _______________________________________________________

## 2021-04-03 NOTE — Progress Notes (Signed)
04/03/2021 Karen Spears [id number] Oct 27, 1955   HISTORY OF PRESENT ILLNESS: This is a 65 year old female who is a patient of Dr. Vena Rua.  She had a colonoscopy with him in October 2017 at which time she was found to have a normal exam.  Otherwise she has only been seen here for the visit just prior to her colonoscopy, but she has not been seen here on any other occasions.  She is here today with complaints of abdominal pain.  She is accompanied by her husband.  She says that she has been terribly sick for about the past 14 weeks.  She says that since the first week of September she has suffered from terrible abdominal pain.  She says that today is the most well that she has been in all this time.  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  The farm does not have animals, but apparently there are animals around the area. They were doing a lot of outdoor work in the mud and rain and the grandkids were visiting, etc. around the time all the symptoms started.  She had thought maybe that she had had pinworms so she took some over-the-counter Reese's pinworm treatment and says that she woke up with worms coming out of her nose.  She has continued to complain of severe abdominal pain.  She says that the pain is so severe that it causes her to shake and wakes her from sleep at night.  She says that it is a deep aching throbbing pain.  She describes it in her epigastrium, but then also diffusely in her lower abdomen.  She is now taking some self treatment over-the-counter called PinRid.  She has had a CT scan of her sinuses because she was complaining of itching in her sinuses.  She saw ENT and was examined by them.  She had a CT scan of her abdomen and pelvis with contrast and an ultrasound that did not show any source of her pain or symptoms.  Blood work has been unremarkable.  She has had a few different stool studies for ova and parasites performed.  She says that she has  had what looks like worms in her stool.  Her husband corroborates her story.  She does take 800 mg of ibuprofen daily since the 1990s.  She is not on any type of PPI therapy.  She denies any nausea, vomiting, heartburn or reflux sensation.  She denies any issues with her bowels, no diarrhea, no rectal bleeding, etc.  She has been referred here on this occasion by her PCP, Dr. Regis Bill, for evaluation of her abdominal pain.   Past Medical History:  Diagnosis Date   Allergic rhinitis    Allergy    Anxiety    Arthritis    hips   Asthmatic bronchitis 07/30/2012   Bladder polyps    and stones dr Amalia Hailey    Bronchospasm 08/20/2012   Much improved on steroid inhaler and removal from old the house. At this time hold on specialty referral consider PFTs in the future maintain on Symbicort for n    Chronic hip pain    bursitis and arthritis   Drug rash 05/13/2011   ? Fixed rash  From diflucan  ?    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 She has never used smokeless tobacco. She reports that she does not drink alcohol and does not use drugs. family history includes Bipolar disorder in an other family member; Colon polyps in her father and mother; Gallstones in her mother and sister. Allergies  Allergen Reactions   Azithromycin Other (See Comments)   Cefdinir Other (See Comments)    REACTION: ? if  headache  see Nov 9th Visit REACTION: ? if headache  see Nov 9th Visit   Fexofenadine Other (See Comments)   Fluconazole Other (See Comments)    REACTION: Raw spot under nose and chin REACTION: Raw spot under nose and chin   Penicillins Swelling   Terbinafine And Related Other (See Comments)    Burning  Nose  Raw        Outpatient Encounter Medications as of 04/03/2021  Medication Sig   albuterol (VENTOLIN HFA) 108 (90 Base) MCG/ACT inhaler Inhale 1-2 puffs into the lungs every 6 (six) hours as needed for wheezing or shortness of breath.   ALPRAZolam (XANAX) 0.25 MG tablet TAKE 1 AND 1/2 TABLETS BY MOUTH EVERY NIGHT AT BEDTIME AS NEEDED   doxycycline (VIBRA-TABS) 100 MG tablet Take 1 tablet (100 mg total) by mouth 2 (two) times daily. If needed for sinusitis   estradiol (VIVELLE-DOT) 0.05 MG/24HR patch APPLY 1 PATCH(0.05 MG) TOPICALLY TO THE SKIN 2 TIMES A WEEK   ibuprofen (ADVIL) 800 MG tablet TAKE 1 TABLET(800 MG) BY MOUTH TWICE DAILY AS NEEDED FOR PAIN   ketoprofen (ORUDIS) 50 MG capsule Take 1 capsule (50 mg total) by mouth 4 (four) times daily as needed.   lidocaine (LIDODERM) 5 % Place 1 patch onto the skin as directed. Remove & Discard patch within 12 hours or as directed by MD   losartan (COZAAR) 25 MG tablet TAKE 1 TABLET(25 MG) BY MOUTH DAILY   metoprolol succinate (TOPROL-XL) 50 MG 24 hr tablet Take with or immediately following a meal.   rizatriptan (MAXALT) 10 MG tablet Take 1 tablet (10 mg total) by mouth as needed. May repeat in 2 hours if needed   terconazole (TERAZOL 3) 0.8 % vaginal cream Place 1 applicator vaginally at bedtime.   triamcinolone cream (KENALOG) 0.1 % Apply 1 application topically 2 (two) times daily.   trimethoprim-polymyxin b (POLYTRIM) ophthalmic solution Place 1 drop into both eyes every 6 (six) hours.   valACYclovir (VALTREX) 1000 MG tablet Take 1 tablet 3 times daily for 7 days then take 1 daily   [DISCONTINUED] mebendazole (VERMOX) 100 MG chewable  tablet Chew 1 tablet (100 mg total) by mouth 2 (two) times daily.   promethazine (PHENERGAN) 25 MG suppository Place 1 suppository (25 mg total) rectally every 6 (six) hours as needed for up to 7 days for nausea.   [DISCONTINUED] mebendazole (VERMOX) 100 MG chewable tablet Chew 1 tablet (100 mg total) by mouth 2 (two) times daily. (Patient not taking:  Reported on 04/03/2021)   No facility-administered encounter medications on file as of 04/03/2021.     REVIEW OF SYSTEMS  : All other systems reviewed and negative except where noted in the History of Present Illness.   PHYSICAL EXAM: BP (!) 150/86   Pulse 69   Ht 5\' 4"  (1.626 m)   Wt 156 lb 9.6 oz (71 kg)   SpO2 98%   BMI 26.88 kg/m  General: Well developed white female in no acute distress; anxious, somewhat pressured speech. Head: Normocephalic and atraumatic Eyes:  Sclerae anicteric, conjunctiva pink. Ears: Normal auditory acuity Lungs: Clear throughout to auscultation; no W/R/R. Heart: Regular rate and rhythm; no M/R/G. Abdomen: Soft, non-distended.  BS present.  Epigastric and lower abdominal TTP. Rectal:  Will be done at the time of colonoscopy. Musculoskeletal: Symmetrical with no gross deformities  Skin: No lesions on visible extremities Extremities: No edema  Neurological: Alert oriented x 4, grossly non-focal Psychological:  Alert and cooperative. Normal mood and affect  ASSESSMENT AND PLAN: *65 year old female with complaints of abdominal pain, primarily epigastric abdominal pain, but also generalized lower abdominal pain.  I am not exactly sure what is causing her symptoms.  Ultrasound, CT scan, labs have all been unremarkable.  She is convinced that this at least all started with some type of parasites/worms back in [place].  She has been using ibuprofen 800 mg daily since the 1990s.  Is not on any type of PPI therapy.  I think that possibly her upper abdominal pain could represent an ulcer.  She does not have  any bowel changes or complaints with those.  No vomiting.  She has had a few stool studies for ova and parasites that have been negative, but she has taken over-the-counter self treatment regimens.  We will plan for both EGD and colonoscopy with Dr. Hilarie Fredrickson.  The risks, benefits, and alternatives to EGD and colonoscopy were discussed with the patient and she consents to proceed.   CC:  Panosh, Standley Brooking, MD

## 2021-04-08 ENCOUNTER — Ambulatory Visit (AMBULATORY_SURGERY_CENTER): Payer: 59 | Admitting: Internal Medicine

## 2021-04-08 ENCOUNTER — Encounter: Payer: Self-pay | Admitting: Internal Medicine

## 2021-04-08 ENCOUNTER — Other Ambulatory Visit: Payer: Self-pay | Admitting: Internal Medicine

## 2021-04-08 VITALS — BP 131/57 | HR 69 | Temp 98.0°F | Resp 19 | Ht 64.0 in | Wt 156.0 lb

## 2021-04-08 DIAGNOSIS — K2970 Gastritis, unspecified, without bleeding: Secondary | ICD-10-CM

## 2021-04-08 DIAGNOSIS — R1013 Epigastric pain: Secondary | ICD-10-CM

## 2021-04-08 DIAGNOSIS — K449 Diaphragmatic hernia without obstruction or gangrene: Secondary | ICD-10-CM | POA: Diagnosis not present

## 2021-04-08 DIAGNOSIS — R1084 Generalized abdominal pain: Secondary | ICD-10-CM

## 2021-04-08 DIAGNOSIS — R933 Abnormal findings on diagnostic imaging of other parts of digestive tract: Secondary | ICD-10-CM | POA: Diagnosis not present

## 2021-04-08 MED ORDER — SODIUM CHLORIDE 0.9 % IV SOLN: 500.0000 mL | Freq: Once | INTRAVENOUS | Status: DC

## 2021-04-08 NOTE — Op Note (Signed)
[name] Patient Name: Karen Spears Procedure Date: 04/08/2021 3:25 PM MRN: [id number] Endoscopist: Jerene Bears , MD Age: 65 Referring MD:  Date of Birth: Apr 17, 1956 Gender: Female Account #: [id number] Procedure:                Colonoscopy Indications:              Generalized abdominal pain, normal [place] and CT                            abd/pelvis (with exception of small periumbilical                            hernia) Medicines:                Monitored Anesthesia Care Procedure:                Pre-Anesthesia Assessment:                           - Prior to the procedure, a History and Physical                            was performed, and patient medications and                            allergies were reviewed. The patient's tolerance of                            previous anesthesia was also reviewed. The risks                            and benefits of the procedure and the sedation                            options and risks were discussed with the patient.                            All questions were answered, and informed consent                            was obtained. Prior Anticoagulants: The patient has                            taken no previous anticoagulant or antiplatelet                            agents. ASA Grade Assessment: II - A patient with                            mild systemic disease. After reviewing the risks                            and benefits, the patient was deemed in  satisfactory condition to undergo the procedure.                           After obtaining informed consent, the colonoscope                            was passed under direct vision. Throughout the                            procedure, the patient's blood pressure, pulse, and                            oxygen saturations were monitored continuously. The                            CF [id number] #[id number] was introduced through the anus                             and advanced to the terminal ileum. The colonoscopy                            was performed without difficulty. The patient                            tolerated the procedure well. The quality of the                            bowel preparation was excellent. The terminal                            ileum, ileocecal valve, appendiceal orifice, and                            rectum were photographed. Scope In: 3:50:50 PM Scope Out: 4:05:26 PM Scope Withdrawal Time: 0 hours 10 minutes 54 seconds  Total Procedure Duration: 0 hours 14 minutes 36 seconds  Findings:                 The digital rectal exam was normal.                           The terminal ileum appeared normal.                           The entire examined colon appeared normal on direct                            and retroflexion views. Complications:            No immediate complications. Impression:               - The examined portion of the ileum was normal.                           - The entire examined colon is normal on direct and  retroflexion views.                           - No specimens collected. Recommendation:           - Patient has a contact number available for                            emergencies. The signs and symptoms of potential                            delayed complications were discussed with the                            patient. Return to normal activities tomorrow.                            Written discharge instructions were provided to the                            patient.                           - Resume previous diet.                           - Continue present medications.                           - Repeat colonoscopy in 10 years for screening                            purposes. Jerene Bears, MD 04/08/2021 4:16:16 PM This report has been signed electronically.

## 2021-04-08 NOTE — Progress Notes (Signed)
Called to room to assist during endoscopic procedure.  Patient ID and intended procedure confirmed with present staff. Received instructions for my participation in the procedure from the performing physician.  

## 2021-04-08 NOTE — Progress Notes (Signed)
DT VS 

## 2021-04-08 NOTE — Op Note (Signed)
[place] Patient Name: Karen Spears Procedure Date: 04/08/2021 3:26 PM MRN: [id number] Endoscopist: Jerene Bears , MD Age: 65 Referring MD:  Date of Birth: August 09, 1955 Gender: Female Account #: [id number] Procedure:                Upper GI endoscopy Indications:              Generalized abdominal pain Medicines:                Monitored Anesthesia Care Procedure:                Pre-Anesthesia Assessment:                           - Prior to the procedure, a History and Physical                            was performed, and patient medications and                            allergies were reviewed. The patient's tolerance of                            previous anesthesia was also reviewed. The risks                            and benefits of the procedure and the sedation                            options and risks were discussed with the patient.                            All questions were answered, and informed consent                            was obtained. Prior Anticoagulants: The patient has                            taken no previous anticoagulant or antiplatelet                            agents. ASA Grade Assessment: II - A patient with                            mild systemic disease. After reviewing the risks                            and benefits, the patient was deemed in                            satisfactory condition to undergo the procedure.                           After obtaining informed consent, the endoscope was  passed under direct vision. Throughout the                            procedure, the patient's blood pressure, pulse, and                            oxygen saturations were monitored continuously. The                            GIF [id number] #[id number] was introduced through the                            mouth, and advanced to the second part of duodenum.                            The upper GI endoscopy was  accomplished without                            difficulty. The patient tolerated the procedure                            well. Scope In: Scope Out: Findings:                 In the proximal esophagus there was a small inlet                            patch without nodularity or visible abnormality.                           The exam of the esophagus was otherwise normal.                           A 2 cm hiatal hernia was present.                           Mild inflammation characterized by erythema and                            granularity was found in the gastric antrum.                            Biopsies were taken with a cold forceps for                            histology and Helicobacter pylori testing.                           The cardia and gastric fundus were normal on                            retroflexion.                           The examined duodenum was normal. Biopsies were  taken with a cold forceps for histology. Complications:            No immediate complications. Estimated Blood Loss:     Estimated blood loss was minimal. Impression:               - Small inlet patch.                           - 2 cm hiatal hernia.                           - Mild gastritis. Biopsied.                           - Normal examined duodenum. Biopsied. Recommendation:           - Patient has a contact number available for                            emergencies. The signs and symptoms of potential                            delayed complications were discussed with the                            patient. Return to normal activities tomorrow.                            Written discharge instructions were provided to the                            patient.                           - Resume previous diet.                           - Continue present medications.                           - Await pathology results.                           - See the other  procedure note for documentation of                            additional recommendations. Jerene Bears, MD 04/08/2021 4:10:40 PM This report has been signed electronically.

## 2021-04-08 NOTE — Progress Notes (Signed)
See office note from last week for details.  For EGD/colonoscopy --- see note for details.  The nature of the procedure, as well as the risks, benefits, and alternatives were carefully and thoroughly reviewed with the patient. Ample time for discussion and questions allowed. The patient understood, was satisfied, and agreed to proceed.

## 2021-04-08 NOTE — Progress Notes (Signed)
Addendum: Reviewed and agree with assessment and management plan. Lyndsy Gilberto M, MD  

## 2021-04-08 NOTE — Patient Instructions (Signed)
Discharge instructions given. Handouts on Hiatal Hernia and Gastritis. Resume previous medications. YOU HAD AN ENDOSCOPIC PROCEDURE TODAY AT [facility]:   Refer to the procedure report that was given to you for any specific questions about what was found during the examination.  If the procedure report does not answer your questions, please call your gastroenterologist to clarify.  If you requested that your care partner not be given the details of your procedure findings, then the procedure report has been included in a sealed envelope for you to review at your convenience later.  YOU SHOULD EXPECT: Some feelings of bloating in the abdomen. Passage of more gas than usual.  Walking can help get rid of the air that was put into your GI tract during the procedure and reduce the bloating. If you had a lower endoscopy (such as a colonoscopy or flexible sigmoidoscopy) you may notice spotting of blood in your stool or on the toilet paper. If you underwent a bowel prep for your procedure, you may not have a normal bowel movement for a few days.  Please Note:  You might notice some irritation and congestion in your nose or some drainage.  This is from the oxygen used during your procedure.  There is no need for concern and it should clear up in a day or so.  SYMPTOMS TO REPORT IMMEDIATELY:  Following lower endoscopy (colonoscopy or flexible sigmoidoscopy):  Excessive amounts of blood in the stool  Significant tenderness or worsening of abdominal pains  Swelling of the abdomen that is new, acute  Fever of 100F or higher  Following upper endoscopy (EGD)  Vomiting of blood or coffee ground material  New chest pain or pain under the shoulder blades  Painful or persistently difficult swallowing  New shortness of breath  Fever of 100F or higher  Black, tarry-looking stools  For urgent or emergent issues, a gastroenterologist can be reached at any hour by calling [phone]. Do not  use MyChart messaging for urgent concerns.    DIET:  We do recommend a small meal at first, but then you may proceed to your regular diet.  Drink plenty of fluids but you should avoid alcoholic beverages for 24 hours.  ACTIVITY:  You should plan to take it easy for the rest of today and you should NOT DRIVE or use heavy machinery until tomorrow (because of the sedation medicines used during the test).    FOLLOW UP: Our staff will call the number listed on your records 48-72 hours following your procedure to check on you and address any questions or concerns that you may have regarding the information given to you following your procedure. If we do not reach you, we will leave a message.  We will attempt to reach you two times.  During this call, we will ask if you have developed any symptoms of COVID 19. If you develop any symptoms (ie: fever, flu-like symptoms, shortness of breath, cough etc.) before then, please call [phone].  If you test positive for Covid 19 in the 2 weeks post procedure, please call and report this information to [place].    If any biopsies were taken you will be contacted by phone or by letter within the next 1-3 weeks.  Please call us at [phone] if you have not heard about the biopsies in 3 weeks.    SIGNATURES/CONFIDENTIALITY: You and/or your care partner have signed paperwork which will be entered into your electronic medical record.  These signatures  attest to the fact that that the information above on your After Visit Summary has been reviewed and is understood.  Full responsibility of the confidentiality of this discharge information lies with you and/or your care-partner.

## 2021-04-08 NOTE — Progress Notes (Signed)
Sedate, gd SR, tolerated procedure well, VSS, report to RN 

## 2021-04-10 ENCOUNTER — Telehealth: Payer: Self-pay | Admitting: *Deleted

## 2021-04-10 NOTE — Telephone Encounter (Signed)
  Follow up Call-  Call back number 04/08/2021  Post procedure Call Back phone  # [phone] cell  Permission to leave phone message Yes  Some recent data might be hidden     Patient questions:  Do you have a fever, pain , or abdominal swelling? No. Pain Score  0 *  Have you tolerated food without any problems? Yes.    Have you been able to return to your normal activities? Yes.    Do you have any questions about your discharge instructions: Diet   No. Medications  No. Follow up visit  No.  Do you have questions or concerns about your Care? No.  Actions: * If pain score is 4 or above: No action needed, pain <4.

## 2021-04-12 ENCOUNTER — Encounter: Payer: Self-pay | Admitting: Internal Medicine

## 2021-04-24 MED ORDER — VALACYCLOVIR HCL 1 G PO TABS
ORAL_TABLET | ORAL | 3 refills | Status: DC
Start: 2021-04-24 — End: 2021-12-27

## 2021-04-24 NOTE — Addendum Note (Signed)
Addended byBurnis Medin on: 04/24/2021 08:23 PM   Modules accepted: Orders

## 2021-04-24 NOTE — Telephone Encounter (Signed)
Sorry you are having recurrence  this may be herpes simplex  if in the same place ( same virus as cold sores and related to the shingles virus)   Still  treated the same   I will send in the refill for the valcyclovir . Not sure what ointment was used .and not sure needed.    See if can take a picture of the rash and send in in my chart

## 2021-05-15 ENCOUNTER — Other Ambulatory Visit: Payer: Self-pay | Admitting: Internal Medicine

## 2021-05-24 ENCOUNTER — Telehealth: Payer: Self-pay | Admitting: Gastroenterology

## 2021-05-24 NOTE — Telephone Encounter (Signed)
My chart message is as follows:  "Continued pain and mucus build up. Dr. Hilarie Fredrickson biopsied a bacterial infection that he found throughout my digestive system. I also had a hernia and hiatal hernia. After the endoscopy and colonoscopy the pain and much were better for several weeks. Over the past weeks, it has built back up like it was before the procedures."

## 2021-05-24 NOTE — Telephone Encounter (Signed)
Pt scheduled to see Dr. Norman Herrlich 06/14/21@8 :50am. Pt notified of appt.

## 2021-05-24 NOTE — Telephone Encounter (Signed)
Karen Spears this pt had procedure with Dr Hilarie Fredrickson on 04/08/21.Marland Kitchen

## 2021-05-27 ENCOUNTER — Encounter: Payer: Self-pay | Admitting: *Deleted

## 2021-05-30 NOTE — Telephone Encounter (Signed)
Unfortunately office day on 06-14-21 was bumped/cancelled by provider. Called and offered her sooner on 06-11-21 but she has court on this day and is unable to make this day. Would like to be seen sooner than next available on 07-16-21. Offered APP but declined, can we triage please?

## 2021-06-03 NOTE — Telephone Encounter (Signed)
Pts appt moved to 2/14@2 :30pm. Pt aware of new appt.

## 2021-06-11 ENCOUNTER — Other Ambulatory Visit: Payer: Self-pay | Admitting: Internal Medicine

## 2021-06-14 ENCOUNTER — Ambulatory Visit: Payer: 59 | Admitting: Internal Medicine

## 2021-06-25 ENCOUNTER — Other Ambulatory Visit (INDEPENDENT_AMBULATORY_CARE_PROVIDER_SITE_OTHER): Payer: 59

## 2021-06-25 ENCOUNTER — Ambulatory Visit: Payer: 59 | Admitting: Internal Medicine

## 2021-06-25 ENCOUNTER — Encounter: Payer: Self-pay | Admitting: Internal Medicine

## 2021-06-25 VITALS — BP 128/70 | HR 82 | Ht 64.0 in | Wt 159.0 lb

## 2021-06-25 DIAGNOSIS — R140 Abdominal distension (gaseous): Secondary | ICD-10-CM

## 2021-06-25 DIAGNOSIS — L290 Pruritus ani: Secondary | ICD-10-CM

## 2021-06-25 DIAGNOSIS — B80 Enterobiasis: Secondary | ICD-10-CM

## 2021-06-25 DIAGNOSIS — R109 Unspecified abdominal pain: Secondary | ICD-10-CM

## 2021-06-25 DIAGNOSIS — R0989 Other specified symptoms and signs involving the circulatory and respiratory systems: Secondary | ICD-10-CM

## 2021-06-25 LAB — CBC WITH DIFFERENTIAL/PLATELET
Basophils Absolute: 0 10*3/uL (ref 0.0–0.1)
Basophils Relative: 0.4 % (ref 0.0–3.0)
Eosinophils Absolute: 0.1 10*3/uL (ref 0.0–0.7)
Eosinophils Relative: 1.1 % (ref 0.0–5.0)
HCT: 40.3 % (ref 36.0–46.0)
Hemoglobin: 13.4 g/dL (ref 12.0–15.0)
Lymphocytes Relative: 7.5 % — ABNORMAL LOW (ref 12.0–46.0)
Lymphs Abs: 0.4 10*3/uL — ABNORMAL LOW (ref 0.7–4.0)
MCHC: 33.2 g/dL (ref 30.0–36.0)
MCV: 86.1 fl (ref 78.0–100.0)
Monocytes Absolute: 0.4 10*3/uL (ref 0.1–1.0)
Monocytes Relative: 6.4 % (ref 3.0–12.0)
Neutro Abs: 4.9 10*3/uL (ref 1.4–7.7)
Neutrophils Relative %: 84.6 % — ABNORMAL HIGH (ref 43.0–77.0)
Platelets: 209 10*3/uL (ref 150.0–400.0)
RBC: 4.68 Mil/uL (ref 3.87–5.11)
RDW: 13 % (ref 11.5–15.5)
WBC: 5.8 10*3/uL (ref 4.0–10.5)

## 2021-06-25 MED ORDER — PANTOPRAZOLE SODIUM 40 MG PO TBEC: 40.0000 mg | DELAYED_RELEASE_TABLET | Freq: Two times a day (BID) | ORAL | 0 refills | Status: DC

## 2021-06-25 NOTE — Progress Notes (Signed)
Subjective:    Patient ID: Karen Spears, female    DOB: 1955/08/26, 66 y.o.   MRN: [id number]  HPI Karen Spears is a 66 year old female with a history of GERD, pinworms and concern for possible other parasitic/helminth infection who is seen for follow-up.  She is here today with her husband.  She was last seen in November 2022.  In November on 04/08/2021 she had upper endoscopy and colonoscopy. EGD revealed small inlet patch, 2 cm hiatal hernia, mild gastritis and normal examined duodenum.  Biopsies from the stomach showed a nonspecific reactive gastropathy without H. pylori.  Duodenal biopsies were normal. Colonoscopy to the terminal ileum was normal.  In October she was having perianal itching consistent with prior pinworm infection.  She took over-the-counter pinworm therapy and just after completing that blew her nose and expelled what she feels strongly were worms.  She saw primary care and was referred to ID.  Ova and parasite exam x3 in the fall 2022 was negative.  Today she reports that she continues to have recurrent perianal itching.  This is only at night.  Wakes her up from sleep.  She is concerned about recurrent pinworm.  She also reports ongoing globus sensation and feels like "something is in my windpipe".  She has some coughing but no more gagging.  No dysphagia or odynophagia symptoms.  Is taking over-the-counter Prilosec typically twice a day which seems to help.  She also is having diffuse abdominal pain which comes and goes.  It can be throbbing and aching and seems to be left abdomen greater than right abdomen.  Has a bloating sensation as well.  Her abdominal pain is worse if she is moving around such as riding in a car.  Bowel movements have been regular formed and complete though occasionally will be "in pieces".  No blood or melena.  No diarrhea.  Omeprazole 20 twice daily does help with bloating and gas but not so much with her abdominal pain.  She is not having  true heartburn symptom.  She is concerned that she may have ingested parasites either [place] or [place] outside.  She also has been treated with albendazole in the past.   Review of Systems As per HPI, otherwise negative  Current Medications, Allergies, Past Medical History, Past Surgical History, Family History and Social History were reviewed in [employer] record.     Objective:   Physical Exam BP 128/70    Pulse 82    Ht 5\' 4"  (1.626 m)    Wt 159 lb (72.1 kg)    BMI 27.29 kg/m  Gen: awake, alert, NAD HEENT: anicteric CV: RRR, no mrg Pulm: CTA b/l Abd: soft, diffusely tender without rebound or guarding, nondistended,  +BS throughout Ext: no c/c/e Neuro: nonfocal   CT ABDOMEN PELVIS W CONTRAST (ROUTINE), 03/09/2021 7:14 PM   INDICATION:Abdominal abscess/infection suspected \ R10.12 LUQ pain  COMPARISON: None.   TECHNIQUE: Multislice axial images were obtained through the abdomen and pelvis with administration of iodinated intravenous contrast material. Multi-planar reformatted images were generated for additional analysis. Nongated technique limits cardiac detail.  Marland Kitchen   FINDINGS:   LOWER CHEST:  .  Mediastinum: Small sliding-type hiatal hernia.  Marland Kitchen  Heart/vessel: Normal heart size. No pericardial effusion.  .  Lungs: Within normal limits.  .  Pleura: Within normal limits.   ABDOMEN/PELVIS:  . Liver: Within normal limits.  Marland Kitchen  Gallbladder/biliary: No evidence of acute cholecystitis.  Marland Kitchen  Spleen: Within normal limits.  .  Pancreas: Within normal limits.  .  Adrenals: Within normal limits.  .  Kidneys: Low-attenuation cystic lesion in the left kidney interpolar region measuring 1.4 cm. Additional subcentimeter hypoattenuating lesions in the left kidney, too small to characterize.  Marland Kitchen  Peritoneum/mesentery/extraperitoneum: No pneumoperitoneum or hemoperitoneum.  .  GI tract: No evidence of  bowel obstruction. Normal appendix.  .  Vascular: Within normal limits.   .  Ureters: Within normal limits.  .  Bladder: Within normal limits.  .  Reproductive system: Hysterectomy.   MSK:  Small periumbilical fat-containing hernia. Grade 1 retrolisthesis at L3-L4. Small sclerotic lesion in the right posterior acetabulum, favored to reflect bone island.    CONCLUSION:   No acute abnormalities in the abdomen or pelvis. Exam End: 03/09/21 19:14   CBC/CMP and lipase normal on 03/09/2021       Assessment & Plan:  66 year old female with a history of GERD, pinworms and concern for possible other parasitic/helminth infection who is seen for follow-up.    Globus sensation --most likely this is a manifestation of uncontrolled reflux.  We discussed this today.  She is on low-dose omeprazole and I recommended increasing PPI for 6 to 8 weeks to see if this symptom improves --Change to pantoprazole 40 mg twice daily AC  2.  Abdominal pain and bloating --possibly an irritable bowel type response.  She is concerned about parasitic/helminth infection.  See #3.  I recommend the following in addition to #3.  Colonoscopy and cross-sectional imaging with CT scan abdomen pelvis very reassuring -- CBC, CMP and TTG --SIBO breath testing --Remain off probiotic  3.  Perianal itching/concern for pinworm --she feels strongly that she has had a parasitic infection with worm, unknown which specific worm.  Fortunately parasitic and helminth infections can be easily treated and cured with therapy with very small risk.  Risk is not 0 for side effects from medication.  She understands this. --Empiric treatment reasonable will cover pinworm but also additional parasitic/helminth infections; mebendazole 100 mg p.o. twice daily x3 days; risks include minor GI discomfort, headache, and rarely leukopenia.  Return visit in April  40 minutes total spent today including patient facing time, coordination of care,  reviewing medical history/procedures/pertinent radiology studies, and documentation of the encounter.

## 2021-06-25 NOTE — Patient Instructions (Addendum)
We have sent the following medications to your pharmacy for you to pick up at your convenience: Pantoprazole 40 mg twice daily  You have been given a testing kit to check for small intestine bacterial overgrowth (SIBO) which is completed by a company named [facility]. Make sure to return your test in the mail using the return mailing label given to you along with the kit. Your demographic and insurance information have already been sent to the company and they should be in contact with you over the next week regarding this test. [facility] will collect an upfront charge of $99.74 for commercial insurance plans and $209.74 is you are paying cash. Make sure to discuss with [facility] PRIOR to having the test if they have gotten informatoin from your insurance company as to how much your testing will cost out of pocket, if any. Please keep in mind that you will be getting a call from phone number [phone] or a similar number. If you do not hear from them within this time frame, please call our office at [phone].   Your provider has requested that you go to the basement level for lab work before leaving today. Press "B" on the elevator. The lab is located at the first door on the left as you exit the elevator.  You have been scheduled for a follow up with Dr Hilarie Fredrickson on 08/28/21 at 1010 am. .

## 2021-06-26 ENCOUNTER — Telehealth: Payer: Self-pay

## 2021-06-26 LAB — COMPREHENSIVE METABOLIC PANEL
ALT: 20 U/L (ref 0–35)
AST: 25 U/L (ref 0–37)
Albumin: 4.4 g/dL (ref 3.5–5.2)
Alkaline Phosphatase: 59 U/L (ref 39–117)
BUN: 17 mg/dL (ref 6–23)
CO2: 28 mEq/L (ref 19–32)
Calcium: 9.2 mg/dL (ref 8.4–10.5)
Chloride: 104 mEq/L (ref 96–112)
Creatinine, Ser: 0.83 mg/dL (ref 0.40–1.20)
GFR: 74.55 mL/min (ref >60.00)
Glucose, Bld: 100 mg/dL — ABNORMAL HIGH (ref 70–99)
Potassium: 4.5 mEq/L (ref 3.5–5.1)
Sodium: 137 mEq/L (ref 135–145)
Total Bilirubin: 0.4 mg/dL (ref 0.2–1.2)
Total Protein: 7.4 g/dL (ref 6.0–8.3)

## 2021-06-26 LAB — TISSUE TRANSGLUTAMINASE, IGA: (tTG) Ab, IgA: <1 U/mL

## 2021-06-26 MED ORDER — MEBENDAZOLE 100 MG PO CHEW
100.0000 mg | CHEWABLE_TABLET | Freq: Two times a day (BID) | ORAL | 0 refills | Status: DC
Start: 2021-06-26 — End: 2021-12-27

## 2021-06-26 NOTE — Progress Notes (Signed)
I called and told Karen Spears that I was sending in the mebendazole for her, confirmed pharmacy.

## 2021-06-26 NOTE — Telephone Encounter (Signed)
Called patient's pharmacy to discuss Emverm chewable tablets versus original script of Vermox. I was advised that Vermox does not come in a chewable tablet, but instead of powder form. Pharmacy ran the powder which would be covered, but are unable to order it.  Prior Authorization has been started on Cover My Meds for Emverm.

## 2021-06-26 NOTE — Addendum Note (Signed)
Addended by: [place], Courtenay Creger E on: 06/26/2021 01:10 PM   Modules accepted: Orders

## 2021-06-27 ENCOUNTER — Encounter: Payer: Self-pay | Admitting: Internal Medicine

## 2021-06-27 ENCOUNTER — Telehealth: Payer: Self-pay | Admitting: Internal Medicine

## 2021-06-27 ENCOUNTER — Telehealth (INDEPENDENT_AMBULATORY_CARE_PROVIDER_SITE_OTHER): Payer: 59 | Admitting: Internal Medicine

## 2021-06-27 VITALS — BP 130/70 | Temp 100.0°F

## 2021-06-27 DIAGNOSIS — J029 Acute pharyngitis, unspecified: Secondary | ICD-10-CM

## 2021-06-27 DIAGNOSIS — U071 COVID-19: Secondary | ICD-10-CM

## 2021-06-27 DIAGNOSIS — J22 Unspecified acute lower respiratory infection: Secondary | ICD-10-CM | POA: Diagnosis not present

## 2021-06-27 DIAGNOSIS — J988 Other specified respiratory disorders: Secondary | ICD-10-CM | POA: Diagnosis not present

## 2021-06-27 MED ORDER — NIRMATRELVIR/RITONAVIR (PAXLOVID)TABLET: 3.0000 | ORAL_TABLET | Freq: Two times a day (BID) | ORAL | 0 refills | Status: AC

## 2021-06-27 NOTE — Progress Notes (Signed)
Virtual Visit via Video Note  I connected with Karen Spears on 06/27/21 at  9:30 AM EST by a video enabled telemedicine application and verified that I am speaking with the correct person using two identifiers. Location patient: home Location provider   home office Persons participating in the virtual visit: patient, provider  [place] national recommendations  regarding COVID 19 pandemic   video visit is advised over in office visit for this patient.  Patient aware  of the limitations of evaluation and management by telemedicine and  availability of in person appointments. and agreed to proceed.   HPI: Karen Spears presents for video visit onset 48 hours ago of not feeling well achiness scratchy sore throat that turned into a bad sore throat which is "difficult to swallow" sinus pain ear pain but not a lot of nasal drainage today is coughed out some green mucus clots that are not deep in her lungs so cough is not predominant.  Feels like her glands are swollen and tender.  Temperature 9998.1 recently.  Has been using Advil.  This past weekend 4 to 5 days [place] that later told them she had they had the flu all week. She is concerned she could have strep throat as in the past.  In addition she is under evaluation with Dr. Hilarie Fredrickson about GI ongoing symptoms.   ROS: See pertinent positives and negatives per HPI.  Past Medical History:  Diagnosis Date   Allergic rhinitis    Allergy    Anxiety    Arthritis    hips   Asthmatic bronchitis 07/30/2012   Bladder polyps    and stones dr Amalia Hailey    Bronchospasm 08/20/2012   Much improved on steroid inhaler and removal from old the house. At this time hold on specialty referral consider PFTs in the future maintain on Symbicort for n    Chronic hip pain    bursitis and arthritis   Drug rash 05/13/2011   ? Fixed rash  From diflucan  ?    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, pelvic floor    Reaction to severe stress 12/31/2011    Past Surgical History:  Procedure Laterality Date   ABDOMINAL HYSTERECTOMY     bladder polypectomy     removed polyps from bladder   CESAREAN SECTION  1985   COLONOSCOPY     vocal cord surgery  1987   WISDOM TOOTH EXTRACTION      Family History  Problem Relation Age of Onset   Gallstones Mother    Gallstones Sister    Bipolar disorder Other        child   Colon cancer Neg Hx    Esophageal cancer Neg Hx    Rectal cancer Neg Hx    Stomach cancer Neg Hx     Social History   Tobacco Use   Smoking status: Never   Smokeless tobacco: Never  Vaping Use   Vaping Use: Never used  Substance Use Topics   Alcohol use: No    Alcohol/week: 0.0 standard drinks   Drug use: No      Current Outpatient Medications:    albuterol (VENTOLIN HFA) 108 (90 Base) MCG/ACT inhaler, Inhale 1-2 puffs into the lungs every 6 (six) hours as needed for wheezing or shortness of breath., Disp: 18 g, Rfl: 3   ALPRAZolam (XANAX) 0.25 MG tablet, TAKE 1 AND 1/2 TABLETS BY MOUTH EVERY NIGHT AT BEDTIME AS NEEDED, Disp: 45 tablet, Rfl: 0   doxycycline (VIBRA-TABS) 100 MG tablet, Take 1 tablet (100 mg total) by mouth 2 (two) times daily. If needed for sinusitis, Disp: 14 tablet, Rfl: 0   estradiol (VIVELLE-DOT) 0.05 MG/24HR patch, APPLY 1 PATCH(0.05 MG) TOPICALLY TO THE SKIN 2 TIMES A WEEK, Disp: 24 patch, Rfl: 1   ibuprofen (ADVIL) 800 MG tablet, TAKE 1 TABLET(800 MG) BY MOUTH TWICE DAILY AS NEEDED FOR PAIN, Disp: 60 tablet, Rfl: 3   ketoprofen (ORUDIS) 50 MG capsule, Take 1 capsule (50 mg total) by  mouth 4 (four) times daily as needed., Disp: 30 capsule, Rfl: 0   lidocaine (LIDODERM) 5 %, Place 1 patch onto the skin as directed. Remove & Discard patch within 12 hours or as directed by MD, Disp: 30 patch, Rfl: 1   losartan (COZAAR) 25 MG tablet, TAKE 1 TABLET(25 MG) BY MOUTH DAILY, Disp: 90 tablet, Rfl: 0   mebendazole (VERMOX) 100 MG chewable tablet, Chew 1 tablet (100 mg total) by mouth 2 (two) times daily., Disp: 6 tablet, Rfl: 0   metoprolol succinate (TOPROL-XL) 50 MG 24 hr tablet, Take with or immediately following a meal. (Patient taking differently: 25 mg 2 (two) times daily. Take with or immediately following a meal.), Disp: 90 tablet, Rfl: 1   nirmatrelvir/ritonavir EUA (PAXLOVID) 20 x 150 MG & 10 x 100MG  TABS, Take 3 tablets by mouth 2 (two) times daily for 5 days. (Take nirmatrelvir 150 mg two tablets twice daily for 5 days and ritonavir 100 mg one tablet twice daily for 5 days) Patient GFR is 74, Disp: 30 tablet, Rfl: 0   pantoprazole (PROTONIX) 40 MG tablet, Take 1 tablet (40 mg total) by mouth 2 (two) times daily before a meal., Disp: 180 tablet, Rfl: 0   rizatriptan (MAXALT) 10 MG tablet, Take 1 tablet (10 mg total) by mouth as needed. May repeat in 2 hours if needed, Disp: 10 tablet, Rfl: 0   terconazole (TERAZOL 3) 0.8 % vaginal cream, Place 1 applicator vaginally at bedtime., Disp: 20 g, Rfl: 1   triamcinolone cream (KENALOG) 0.1 %, Apply 1 application topically 2 (two) times daily., Disp: 30 g, Rfl: 2   valACYclovir (VALTREX) 1000 MG tablet, Take 1 tablet 3 times daily for 7 days then take 1 daily, Disp: 60 tablet, Rfl: 3   promethazine (PHENERGAN) 25 MG suppository, Place 1 suppository (25 mg total) rectally every 6 (six) hours as needed for up to 7 days for nausea., Disp: 6 each, Rfl: 1  EXAM: BP Readings from Last 3 Encounters:  06/27/21 130/70  06/25/21 128/70  04/08/21 (!) 131/57    VITALS per patient if applicable:  GENERAL: alert, oriented, appears well and in no  acute distress sick but non toxic  ,modestly hoarse no stridor   HEENT: atraumatic, conjunttiva clear, no obvious abnormalities on inspection of external nose and ears no facial swelling   points to  jdbg area of tender glands and  maxillary tenderness  NECK: normal movements of the head  and neck  LUNGS: on inspection no signs of respiratory distress, breathing rate appears normal, no obvious gross SOB, gasping or wheezing  CV: no obvious cyanosis  nl color   MS: moves all visible extremities without noticeable abnormality  PSYCH/NEURO: pleasant and cooperative, speech and thought processing grossly intact Lab Results  Component Value Date   WBC 5.8 06/25/2021   HGB 13.4 06/25/2021   HCT 40.3 06/25/2021   PLT 209.0 06/25/2021   GLUCOSE 100 (H) 06/25/2021   CHOL 168 11/26/2020   TRIG 49.0 11/26/2020   HDL 50.20 11/26/2020   LDLCALC 108 (H) 11/26/2020   ALT 20 06/25/2021   AST 25 06/25/2021   NA 137 06/25/2021   K 4.5 06/25/2021   CL 104 06/25/2021   CREATININE 0.83 06/25/2021   BUN 17 06/25/2021   CO2 28 06/25/2021   TSH 2.64 11/26/2020   HGBA1C 5.5 11/26/2020    ASSESSMENT AND PLAN:  Discussed the following assessment and plan:    ICD-10-CM   1. Acute respiratory infection  J22    w low grade fever  body aches sinus pain  very sore throat extedned public exposures     2. Respiratory tract infection due to COVID-19 virus  U07.1    J98.8    risk 3 +    3. Pharyngitis, unspecified etiology  J02.9      Suspect possibility of COVID infection and/or flu.  However she has had bacterial throat and sinus infections. She will do another rapid test at home send in the information  today if positive will send in Paxlovid gfr 33  risk  age and hx of resp rad depending .  And BP  controlled  If negative we will send in Keflex for possible sinus strep.  She has side effects with a number of antibiotics but is not been truly allergic to [facility] but question if she had a headache  with Omn[employer] in the remote past.  See allergy contraindication list. No work this week. Counseled.   Expectant management and discussion of plan and treatment with opportunity to ask questions and all were answered. The patient agreed with the plan and demonstrated an understanding of the instructions.   Advised to call back or seek an in-person evaluation if worsening  or having  further concerns  in interim. Return if symptoms worsen or fail to improve, for depending .    Shanon Ace, MD   See message  pos home covid test . Sending in paxlovid  as planned.

## 2021-06-27 NOTE — Telephone Encounter (Signed)
Pt has tested positive for covid today and pt husband md wanted to know the results immediately

## 2021-06-27 NOTE — Telephone Encounter (Signed)
Pt informed that rx has been sent.

## 2021-06-27 NOTE — Telephone Encounter (Signed)
I sent her a message and send in paxlovid  please make sure she got the message

## 2021-06-28 MED ORDER — PREDNISOLONE 15 MG/5ML PO SOLN: 15.0000 mg | Freq: Four times a day (QID) | ORAL | 0 refills | Status: DC | PRN

## 2021-06-28 NOTE — Telephone Encounter (Signed)
I will send in a RX for a liquid Prednisone which will help the swelling in her throat

## 2021-07-05 ENCOUNTER — Other Ambulatory Visit: Payer: Self-pay | Admitting: Internal Medicine

## 2021-07-14 ENCOUNTER — Other Ambulatory Visit: Payer: Self-pay | Admitting: Internal Medicine

## 2021-07-15 NOTE — Telephone Encounter (Signed)
Contacted patient's insurance. They have the patient's PA, but needed office notes. I was advised that a fax was sent to notify us, but this was never received. I have faxed office notes to the insurance company and have tried to contact the patient. Patient's phone rang twice and stopped with no answer. ?

## 2021-07-15 NOTE — Telephone Encounter (Signed)
Requesting an update on her medication please call [phone] or she says she has sent several my chart messages. ?

## 2021-07-15 NOTE — Telephone Encounter (Signed)
Sent message to patient through MyChart. 

## 2021-07-17 NOTE — Telephone Encounter (Signed)
Prior Auth for Emvern 100 mg chews BID for 3 days denied. "Criteria require: Failure of a trial of albendazole". Please advise ?

## 2021-07-18 MED ORDER — ALBENDAZOLE 200 MG PO TABS: 200.0000 mg | ORAL_TABLET | Freq: Every day | ORAL | 0 refills | Status: AC

## 2021-07-18 NOTE — Telephone Encounter (Signed)
Discussed with Dr. Hilarie Fredrickson. Albendazole 200 mg has been sent in once daily for 4 days. ?

## 2021-07-18 NOTE — Telephone Encounter (Signed)
Same med so ok for substitution at same dose ?

## 2021-07-23 ENCOUNTER — Encounter: Payer: Self-pay | Admitting: Internal Medicine

## 2021-07-23 DIAGNOSIS — K6389 Other specified diseases of intestine: Secondary | ICD-10-CM | POA: Insufficient documentation

## 2021-07-23 MED ORDER — RIFAXIMIN 550 MG PO TABS: 550.0000 mg | ORAL_TABLET | Freq: Three times a day (TID) | ORAL | 0 refills | Status: DC

## 2021-07-23 NOTE — Telephone Encounter (Signed)
See note from patient ?I do not recommend extending the anti-parasite treatment, she has completed the full course. 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

## 2021-07-23 NOTE — Telephone Encounter (Signed)
I have spoken to patient to advise that we have received results from [facility] small intestine bacterial overgrowth testing. Per Dr Hilarie Fredrickson: ? ?"07/23/21 ?Patient is positive for SIBO. Likely explanation of abdominal symptoms discussed at last office visit. Please treat with 1) rifaxamin 550 mg three times daily x 14 days. ?IF NOT COVERED ?2)Bactrim DS twice daily x 10 days. ?JMP" ? ?Patient verbalizes understanding of these results and is advised that we will send xifaxan to her local pharmacy for pick up.  ? ?Patient states that the anti-parasite treatment given at her previous visit has much improved her symptoms and she feels so much better. ? ?

## 2021-07-26 ENCOUNTER — Encounter: Payer: Self-pay | Admitting: *Deleted

## 2021-08-02 NOTE — Telephone Encounter (Signed)
I have spoken to patient to advise that we did send additional information to her insurance company regarding xifaxan on 07/30/21. We have not yet heard back from them. Patient states that she would like to wait until Tuesday to see if insurance responds. If they do not, she would like to pick up the alternative prescription that Dr Hilarie Fredrickson suggested (Bactrim). ?

## 2021-08-06 ENCOUNTER — Other Ambulatory Visit: Payer: Self-pay

## 2021-08-06 ENCOUNTER — Encounter: Payer: Self-pay | Admitting: Internal Medicine

## 2021-08-06 ENCOUNTER — Other Ambulatory Visit: Payer: Self-pay | Admitting: Internal Medicine

## 2021-08-06 DIAGNOSIS — J22 Unspecified acute lower respiratory infection: Secondary | ICD-10-CM

## 2021-08-06 DIAGNOSIS — R058 Other specified cough: Secondary | ICD-10-CM

## 2021-08-06 MED ORDER — SULFAMETHOXAZOLE-TRIMETHOPRIM 800-160 MG PO TABS
1.0000 | ORAL_TABLET | Freq: Two times a day (BID) | ORAL | 0 refills | Status: DC
Start: 2021-08-06 — End: 2021-08-28

## 2021-08-06 MED ORDER — BUDESONIDE-FORMOTEROL FUMARATE 160-4.5 MCG/ACT IN AERO: 2.0000 | INHALATION_SPRAY | Freq: Two times a day (BID) | RESPIRATORY_TRACT | 3 refills | Status: DC

## 2021-08-06 MED ORDER — ALBUTEROL SULFATE HFA 108 (90 BASE) MCG/ACT IN AERS: 1.0000 | INHALATION_SPRAY | Freq: Four times a day (QID) | RESPIRATORY_TRACT | 3 refills | Status: AC | PRN

## 2021-08-06 NOTE — Telephone Encounter (Signed)
Should be ok to take the symbicort   with other meds . ? Karen Spears please refill  the symbicort x 3 to her pharmacy  and see what other med she is requesting .  thanks

## 2021-08-06 NOTE — Telephone Encounter (Signed)
I have advised patient that we still have not received correspondence from insurance with determination of coverage for xifaxan. We have sent information to insurance multiple times on this medication. The only response we have received thus far is another prior authorization request. Patient states she would like to just go forward with bactrim rx at this time. Rx sent for bactrim. ?

## 2021-08-06 NOTE — Addendum Note (Signed)
Addended by: Larina Bras on: 08/06/2021 01:33 PM ? ? Modules accepted: Orders ? ?

## 2021-08-12 ENCOUNTER — Other Ambulatory Visit: Payer: Self-pay | Admitting: Internal Medicine

## 2021-08-13 ENCOUNTER — Other Ambulatory Visit: Payer: Self-pay | Admitting: Internal Medicine

## 2021-08-13 NOTE — Telephone Encounter (Signed)
Last refill per controlled substance database: 06/12/21 ?Last OV: 06/27/21 for acute; last cpe 11/26/20 ?Next OV: none scheduled ?

## 2021-08-27 ENCOUNTER — Other Ambulatory Visit: Payer: Self-pay | Admitting: Internal Medicine

## 2021-08-28 ENCOUNTER — Encounter: Payer: Self-pay | Admitting: Internal Medicine

## 2021-08-28 ENCOUNTER — Ambulatory Visit: Payer: 59 | Admitting: Internal Medicine

## 2021-08-28 VITALS — BP 122/80 | HR 73 | Ht 64.0 in | Wt 159.0 lb

## 2021-08-28 DIAGNOSIS — R0989 Other specified symptoms and signs involving the circulatory and respiratory systems: Secondary | ICD-10-CM

## 2021-08-28 DIAGNOSIS — K219 Gastro-esophageal reflux disease without esophagitis: Secondary | ICD-10-CM | POA: Diagnosis not present

## 2021-08-28 DIAGNOSIS — K589 Irritable bowel syndrome without diarrhea: Secondary | ICD-10-CM | POA: Diagnosis not present

## 2021-08-28 DIAGNOSIS — K6389 Other specified diseases of intestine: Secondary | ICD-10-CM

## 2021-08-28 DIAGNOSIS — R109 Unspecified abdominal pain: Secondary | ICD-10-CM | POA: Diagnosis not present

## 2021-08-28 MED ORDER — GLYCOPYRROLATE 2 MG PO TABS: 2.0000 mg | ORAL_TABLET | Freq: Two times a day (BID) | ORAL | 2 refills | Status: AC | PRN

## 2021-08-28 NOTE — Progress Notes (Signed)
? ?  Subjective:  ? ? Patient ID: Karen Spears, female    DOB: [birth year], 66 y.o.   MRN: [id number] ? ?HPI ?Karen Spears is a 66 year old female with a history of GERD, pinworms/helminth infection, SIBO who is here for follow-up.  She is here today with her husband and was last seen on 06/25/2021. ? ?Since her last visit she had a positive SIBO breath test and took Bactrim twice daily x10 days.  On the last day of Bactrim she had an allergic reaction and rash.  She was treated with steroids.  Her abdominal bloating, borborygmi and bowel movements have completely normalized.  She is very happy with the response.  She still having some random abdominal pinching type discomfort which is worse with activity.  Riding in car also can make her abdomen feel uncomfortable. ? ?She reports that she took mebendazole and had resolution of her perianal itching.  She did see white threads in the stool for a day or so after this medication.  She is now able to sleep through the night. ? ?Her globus sensation has completely resolved with twice daily pantoprazole. ? ? ?Review of Systems ?As per HPI, otherwise negative ? ?Current Medications, Allergies, Past Medical History, Past Surgical History, Family History and Social History were reviewed in [employer] record. ?   ?Objective:  ? Physical Exam ?BP 122/80   Pulse 73   Ht '5\' 4"'$  (1.626 m)   Wt 159 lb (72.1 kg)   BMI 27.29 kg/m?  ?Gen: awake, alert, NAD ?HEENT: anicteric ?Neuro: nonfocal ? ? ?   ?Assessment & Plan:  ?66 year old female with a history of GERD, pinworms/helminth infection, SIBO who is here for follow-up. ? ?GERD/globus sensation --improved with twice daily pantoprazole.  Continue for now. ?--Pantoprazole 40 mg twice daily AC ? ?2.  SIBO --treated with Bactrim with excellent response.  Abdominal bloating has improved.  She now has a Bactrim allergy so if ever needs retreatment consider rifaximin.  Remain off probiotic. ? ?3.  IBS with  abdominal discomfort --we discussed this today and going to try Robinul and FODMAP diet ?--Glycopyrrolate 2 mg every 12 hours as needed ?--FODMAP diet; handout given today ? ?4.  Pinworm/perianal itching/helminth infection --resolution of symptoms with mebendazole ? ?Follow-up in July per patient request; she can contact the office by MyChart message in early May to schedule the July appointment once the July schedule has been released ? ?30 minutes total spent today including patient facing time, coordination of care, reviewing medical history/procedures/pertinent radiology studies, and documentation of the encounter. ? ? ?

## 2021-08-28 NOTE — Patient Instructions (Signed)
We have sent the following medications to your pharmacy for you to pick up at your convenience: ?Robinul (Glycopyrrolate) ? ?Continue Pantoprazole '40mg'$ - Twice daily.  ? ?Follow Fod-Map Diet  ? ?Send my chart message in May to make a follow up appointment with Dr Hilarie Fredrickson.  ? ?Thank you for choosing me and [facility]. ? ? ?

## 2021-08-30 ENCOUNTER — Encounter: Payer: Self-pay | Admitting: Internal Medicine

## 2021-09-26 ENCOUNTER — Other Ambulatory Visit: Payer: Self-pay | Admitting: Internal Medicine

## 2021-10-18 ENCOUNTER — Other Ambulatory Visit: Payer: Self-pay | Admitting: Family Medicine

## 2021-10-21 NOTE — Telephone Encounter (Signed)
Last refill per controlled substance database: 08/13/21 by Dr Elease Hashimoto Last OV: 03/11/21 for acute reasons Next OV: none scheduled

## 2021-11-05 ENCOUNTER — Ambulatory Visit: Payer: 59 | Admitting: Gastroenterology

## 2021-11-05 ENCOUNTER — Encounter: Payer: Self-pay | Admitting: Gastroenterology

## 2021-11-05 VITALS — BP 150/80 | HR 64 | Ht 64.0 in | Wt 163.0 lb

## 2021-11-05 DIAGNOSIS — K580 Irritable bowel syndrome with diarrhea: Secondary | ICD-10-CM | POA: Diagnosis not present

## 2021-11-05 MED ORDER — RIFAXIMIN 550 MG PO TABS: 550.0000 mg | ORAL_TABLET | Freq: Three times a day (TID) | ORAL | 0 refills | Status: AC

## 2021-11-06 ENCOUNTER — Telehealth: Payer: Self-pay | Admitting: Gastroenterology

## 2021-11-06 NOTE — Telephone Encounter (Signed)
Inbound call from patient stating that her insurance has denied XIFAXAN. Patient stated she needs a PA for prescription. Please advise.

## 2021-11-06 NOTE — Telephone Encounter (Signed)
Please submit for PA for Xifaxan. DX: IBS-D

## 2021-11-07 NOTE — Telephone Encounter (Signed)
Submitted a Prior Authorization request to  [employer]  for  Xifaxan '550mg'$   via CoverMyMeds. Will update once we receive a response.   Key: [id number]

## 2021-11-14 ENCOUNTER — Telehealth: Payer: Self-pay | Admitting: Gastroenterology

## 2021-11-14 NOTE — Telephone Encounter (Signed)
Please provide update for patient.

## 2021-11-21 NOTE — Telephone Encounter (Signed)
Xifaxan was denied. Please advise.

## 2021-11-22 ENCOUNTER — Other Ambulatory Visit (HOSPITAL_COMMUNITY): Payer: Self-pay

## 2021-11-25 ENCOUNTER — Other Ambulatory Visit (HOSPITAL_COMMUNITY): Payer: Self-pay

## 2021-11-25 NOTE — Telephone Encounter (Signed)
Samples available. Patient aware and will pick them up.

## 2021-11-25 NOTE — Telephone Encounter (Signed)
Would recommend rifaximin 550 mg 3 times daily x14 days; please provide samples given med not approved

## 2021-12-10 ENCOUNTER — Other Ambulatory Visit: Payer: Self-pay | Admitting: Family Medicine

## 2021-12-11 ENCOUNTER — Other Ambulatory Visit: Payer: Self-pay | Admitting: Family Medicine

## 2021-12-13 ENCOUNTER — Other Ambulatory Visit: Payer: Self-pay | Admitting: Internal Medicine

## 2021-12-27 ENCOUNTER — Encounter: Payer: Self-pay | Admitting: Internal Medicine

## 2021-12-27 ENCOUNTER — Other Ambulatory Visit (INDEPENDENT_AMBULATORY_CARE_PROVIDER_SITE_OTHER): Payer: 59

## 2021-12-27 ENCOUNTER — Ambulatory Visit: Payer: 59 | Admitting: Internal Medicine

## 2021-12-27 VITALS — BP 140/80 | HR 67 | Ht 64.0 in | Wt 162.0 lb

## 2021-12-27 DIAGNOSIS — F22 Delusional disorders: Secondary | ICD-10-CM

## 2021-12-27 DIAGNOSIS — K580 Irritable bowel syndrome with diarrhea: Secondary | ICD-10-CM

## 2021-12-27 DIAGNOSIS — R1084 Generalized abdominal pain: Secondary | ICD-10-CM

## 2021-12-27 LAB — VITAMIN D 25 HYDROXY (VIT D DEFICIENCY, FRACTURES): VITD: 22.59 ng/mL — ABNORMAL LOW (ref 30.00–100.00)

## 2021-12-27 LAB — VITAMIN B12: Vitamin B-12: 121 pg/mL — ABNORMAL LOW (ref 211–911)

## 2021-12-27 MED ORDER — AMITRIPTYLINE HCL 25 MG PO TABS: 25.0000 mg | ORAL_TABLET | Freq: Every day | ORAL | 0 refills | Status: AC

## 2021-12-27 MED ORDER — AMITRIPTYLINE HCL 50 MG PO TABS
50.0000 mg | ORAL_TABLET | Freq: Every day | ORAL | 1 refills | Status: AC
Start: 2021-12-27 — End: ?

## 2021-12-27 NOTE — Patient Instructions (Addendum)
Your provider has requested that you go to the basement level for lab work before leaving today. Press "B" on the elevator. The lab is located at the first door on the left as you exit the elevator.  We have sent the following medications to your pharmacy for you to pick up at your convenience: Amitriptyline 25 mg nightly for 1 week, then increase to 50 mg nightly.   You have been scheduled for a CT scan of the abdomen and pelvis at [facility], 1st floor Radiology. You are scheduled on Wednesday 01/01/22 at 5 pm. You should arrive 30 minutes prior to your appointment time for registration.  We are giving you 2 bottles of contrast today that you will need to drink before arriving for the exam. The solution may taste better if refrigerated so put them in the refrigerator when you get home, but do NOT add ice or any other liquid to this solution as that would dilute it. Shake well before drinking.   Please follow the written instructions below on the day of your exam:   1) Do not eat anything after 1 pm (4 hours prior to your test)   2) Drink 1 bottle of contrast @ 3 pm (2 hours prior to your exam)  Remember to shake well before drinking and do NOT pour over ice.     Drink 1 bottle of contrast @ 4 pm (1 hour prior to your exam)   You may take any medications as prescribed with a small amount of water, if necessary. If you take any of the following medications: METFORMIN, GLUCOPHAGE, GLUCOVANCE, AVANDAMET, RIOMET, FORTAMET, [place] MET, JANUMET, GLUMETZA or METAGLIP, you MAY be asked to HOLD this medication 48 hours AFTER the exam.   The purpose of you drinking the oral contrast is to aid in the visualization of your intestinal tract. The contrast solution may cause some diarrhea. Depending on your individual set of symptoms, you may also receive an intravenous injection of x-ray contrast/dye. Plan on being at [facility] for 45 minutes or longer, depending on the type of exam you are having  performed.   If you have any questions regarding your exam or if you need to reschedule, you may call [facility] at [phone] between the hours of 8:00 am and 5:00 pm, Monday-Friday.

## 2021-12-27 NOTE — Progress Notes (Signed)
Subjective:    Patient ID: Karen Spears, female    DOB: [birth year], 66 y.o.   MRN: [id number]  HPI Karen Spears is a 66 year old female with a history of GERD, prior pinworm infection, SIBO (documented by breath test), small hiatal hernia, history of migraines, anxiety who is here for follow-up.  She is here alone today.  She was treated again with rifaximin 550 mg 3 times daily x14 days in July for recurrent SIBO symptom.  She reports that she continues to have severe abdominal pain.  She feels like she is being "eaten from the inside out" and that she has told her husband "where to bury me".  She has left-sided, right-sided and migrating aching abdominal pain.  Recently she has had a stabbing type sensation that seems to come and go from her lower lungs.  She has extreme fatigue and poor stamina.  Her abdominal bloating and lower deep abdominal pain got better with rifaximin.  She has tried the FODMAP diet but she needed a little bit with bread.  She is seeing stringy mucus in her nose and throat.  She reports that the pain is worse riding in a car which she has avoided due to vibration.  She is not cooking at home and eating only takeout.  She reports that most of the "[place]" in her life is gone.  She is using Xanax at night to help sleep and tried to reduce her ibuprofen intake.  She has been using pantoprazole and glycopyrrolate both of which she feels are helpful.  She continues to be concerned about recurrent parasites and feels that it would be more than 1 parasite or either recurrent bacteria.  She has changed primary care to Dr. Rowe Clack in [place].  She reports recently having full physical and lab work earlier this week.  EGD and colonoscopy reviewed from November 2022, these exams including biopsy of the stomach and small bowel were very reassuring.  Review of Systems As per HPI, otherwise negative  Current Medications, Allergies, Past Medical History, Past Surgical History,  Family History and Social History were reviewed in [employer] record.    Objective:   Physical Exam BP (!) 140/80   Pulse 67   Ht '5\' 4"'$  (1.626 m)   Wt 162 lb (73.5 kg)   BMI 27.81 kg/m  Gen: awake, alert, NAD HEENT: anicteric Abd: soft, NT/ND, +BS throughout Ext: no c/c/e Neuro: nonfocal  Labs requested from primary care     Assessment & Plan:  66 year old female with a history of GERD, prior pinworm infection, SIBO (documented by breath test), small hiatal hernia, history of migraines, anxiety who is here for follow-up.   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  She seems to understand that this is a real condition.  I do not think she has parasites.  She did have breath test documented SIBO but this has been completely treated with rifaximin and it is certainly too early for recurrence.  Her migratory abdominal pains are likely related to IBS and I have recommended we try TCA.  Cross-sectional imaging has not been performed and I am going to perform a CT scan with contrast to exclude other causes of abdominal pain which would not have been apparent on upper and lower endoscopy. --  CT scan abdomen pelvis with IV contrast -- Obtain lab work from primary care done recently -- Check vitamin B12 and vitamin D today -- Begin amitriptyline 25 mg nightly increasing to 50 mg after 1 week if tolerated; hopefully this medication can replace her alprazolam which she is using at bedtime to help with sleep.  I explained that I am using this medication for pain associated with IBS and the enteric nervous system.  Positive effect from this medication will take at least several weeks for efficacy --Can continue glycopyrrolate 2  mg every 12 hours -- Follow-up with me in 3 months  2.  GERD with small hiatal hernia --continue pantoprazole 40 mg once to twice daily before meals  40 minutes total spent today including patient facing time, coordination of care, reviewing medical history/procedures/pertinent radiology studies, and documentation of the encounter.

## 2021-12-30 ENCOUNTER — Other Ambulatory Visit: Payer: Self-pay

## 2021-12-30 DIAGNOSIS — K580 Irritable bowel syndrome with diarrhea: Secondary | ICD-10-CM

## 2021-12-30 DIAGNOSIS — E559 Vitamin D deficiency, unspecified: Secondary | ICD-10-CM

## 2021-12-30 DIAGNOSIS — E539 Vitamin B deficiency, unspecified: Secondary | ICD-10-CM

## 2021-12-30 MED ORDER — CYANOCOBALAMIN 1000 MCG/ML IJ SOLN: INTRAMUSCULAR | 0 refills | Status: AC

## 2021-12-30 MED ORDER — CYANOCOBALAMIN 1000 MCG/ML IJ SOLN: 1000.0000 ug | INTRAMUSCULAR | 11 refills | Status: AC

## 2022-01-01 ENCOUNTER — Ambulatory Visit (HOSPITAL_COMMUNITY)
Admission: RE | Admit: 2022-01-01 | Discharge: 2022-01-01 | Disposition: A | Discharge status: Home / Self Care | Payer: 59 | Source: Ambulatory Visit | Attending: Internal Medicine | Admitting: Internal Medicine

## 2022-01-01 DIAGNOSIS — R1084 Generalized abdominal pain: Secondary | ICD-10-CM | POA: Diagnosis present

## 2022-01-01 DIAGNOSIS — K580 Irritable bowel syndrome with diarrhea: Secondary | ICD-10-CM | POA: Diagnosis present

## 2022-01-01 MED ORDER — IOHEXOL 300 MG/ML  SOLN
100.0000 mL | Freq: Once | INTRAMUSCULAR | Status: AC | PRN
Start: 2022-01-01 — End: 2022-01-01
  Administered 2022-01-01: 100 mL via INTRAVENOUS

## 2022-01-01 MED ORDER — IOHEXOL 9 MG/ML PO SOLN
1000.0000 mL | Freq: Once | ORAL | Status: AC
  Administered 2022-01-01: 1000 mL via ORAL

## 2022-02-20 ENCOUNTER — Other Ambulatory Visit (INDEPENDENT_AMBULATORY_CARE_PROVIDER_SITE_OTHER): Payer: 59

## 2022-02-20 DIAGNOSIS — K580 Irritable bowel syndrome with diarrhea: Secondary | ICD-10-CM

## 2022-02-20 DIAGNOSIS — E559 Vitamin D deficiency, unspecified: Secondary | ICD-10-CM | POA: Diagnosis not present

## 2022-02-20 DIAGNOSIS — E539 Vitamin B deficiency, unspecified: Secondary | ICD-10-CM | POA: Diagnosis not present

## 2022-02-20 LAB — VITAMIN D 25 HYDROXY (VIT D DEFICIENCY, FRACTURES): VITD: 41.93 ng/mL (ref 30.00–100.00)

## 2022-02-20 LAB — VITAMIN B12: Vitamin B-12: 318 pg/mL (ref 211–911)

## 2022-02-26 ENCOUNTER — Ambulatory Visit: Payer: 59 | Admitting: Internal Medicine

## 2022-03-17 ENCOUNTER — Other Ambulatory Visit: Payer: Self-pay | Admitting: Internal Medicine

## 2022-05-28 ENCOUNTER — Other Ambulatory Visit: Payer: 59

## 2022-05-28 ENCOUNTER — Other Ambulatory Visit: Payer: Self-pay

## 2022-05-28 DIAGNOSIS — E559 Vitamin D deficiency, unspecified: Secondary | ICD-10-CM

## 2022-05-28 DIAGNOSIS — E539 Vitamin B deficiency, unspecified: Secondary | ICD-10-CM

## 2022-06-09 ENCOUNTER — Other Ambulatory Visit: Payer: Self-pay | Admitting: Internal Medicine

## 2023-01-19 ENCOUNTER — Other Ambulatory Visit: Payer: Self-pay | Admitting: Family

## 2023-07-05 ENCOUNTER — Other Ambulatory Visit: Payer: Self-pay | Admitting: Internal Medicine
